# Patient Record
Sex: Male | Born: 1960 | Race: Black or African American | Hispanic: No | Marital: Single | Smoking: Current every day smoker
Health system: Southern US, Community
[De-identification: ages and names within clinical notes are randomized; demographics above are authoritative.]

## PROBLEM LIST (undated history)

## (undated) DIAGNOSIS — I639 Cerebral infarction, unspecified: Secondary | ICD-10-CM

## (undated) DIAGNOSIS — I10 Essential (primary) hypertension: Secondary | ICD-10-CM

## (undated) HISTORY — DX: Cerebral infarction, unspecified: I63.9

---

## 2005-10-17 ENCOUNTER — Ambulatory Visit: Payer: Self-pay | Admitting: Family Medicine

## 2013-02-23 ENCOUNTER — Inpatient Hospital Stay (HOSPITAL_COMMUNITY): Payer: Medicaid Other

## 2013-02-23 ENCOUNTER — Emergency Department (HOSPITAL_BASED_OUTPATIENT_CLINIC_OR_DEPARTMENT_OTHER): Payer: Medicaid Other

## 2013-02-23 ENCOUNTER — Encounter (HOSPITAL_BASED_OUTPATIENT_CLINIC_OR_DEPARTMENT_OTHER): Payer: Self-pay | Admitting: Emergency Medicine

## 2013-02-23 ENCOUNTER — Inpatient Hospital Stay (HOSPITAL_BASED_OUTPATIENT_CLINIC_OR_DEPARTMENT_OTHER)
Admission: EM | Admit: 2013-02-23 | Discharge: 2013-03-02 | DRG: 917 | Disposition: A | Discharge status: Rehabilitation Facility | Payer: Medicaid Other | Attending: Neurology | Admitting: Neurology

## 2013-02-23 DIAGNOSIS — G8190 Hemiplegia, unspecified affecting unspecified side: Secondary | ICD-10-CM | POA: Diagnosis present

## 2013-02-23 DIAGNOSIS — R569 Unspecified convulsions: Secondary | ICD-10-CM | POA: Diagnosis present

## 2013-02-23 DIAGNOSIS — T43601A Poisoning by unspecified psychostimulants, accidental (unintentional), initial encounter: Secondary | ICD-10-CM | POA: Diagnosis present

## 2013-02-23 DIAGNOSIS — D72829 Elevated white blood cell count, unspecified: Secondary | ICD-10-CM | POA: Diagnosis present

## 2013-02-23 DIAGNOSIS — F1010 Alcohol abuse, uncomplicated: Secondary | ICD-10-CM | POA: Diagnosis present

## 2013-02-23 DIAGNOSIS — T40901A Poisoning by unspecified psychodysleptics [hallucinogens], accidental (unintentional), initial encounter: Secondary | ICD-10-CM | POA: Diagnosis present

## 2013-02-23 DIAGNOSIS — G936 Cerebral edema: Secondary | ICD-10-CM | POA: Diagnosis present

## 2013-02-23 DIAGNOSIS — F1210 Cannabis abuse, uncomplicated: Secondary | ICD-10-CM | POA: Diagnosis present

## 2013-02-23 DIAGNOSIS — E876 Hypokalemia: Secondary | ICD-10-CM | POA: Diagnosis not present

## 2013-02-23 DIAGNOSIS — T405X1A Poisoning by cocaine, accidental (unintentional), initial encounter: Principal | ICD-10-CM | POA: Diagnosis present

## 2013-02-23 DIAGNOSIS — R1310 Dysphagia, unspecified: Secondary | ICD-10-CM | POA: Diagnosis present

## 2013-02-23 DIAGNOSIS — T40904A Poisoning by unspecified psychodysleptics [hallucinogens], undetermined, initial encounter: Secondary | ICD-10-CM | POA: Diagnosis present

## 2013-02-23 DIAGNOSIS — F1410 Cocaine abuse, uncomplicated: Secondary | ICD-10-CM | POA: Diagnosis present

## 2013-02-23 DIAGNOSIS — F17200 Nicotine dependence, unspecified, uncomplicated: Secondary | ICD-10-CM | POA: Diagnosis present

## 2013-02-23 DIAGNOSIS — I10 Essential (primary) hypertension: Secondary | ICD-10-CM | POA: Diagnosis present

## 2013-02-23 DIAGNOSIS — F1910 Other psychoactive substance abuse, uncomplicated: Secondary | ICD-10-CM | POA: Diagnosis present

## 2013-02-23 DIAGNOSIS — T510X4A Toxic effect of ethanol, undetermined, initial encounter: Secondary | ICD-10-CM | POA: Diagnosis present

## 2013-02-23 DIAGNOSIS — T510X1A Toxic effect of ethanol, accidental (unintentional), initial encounter: Secondary | ICD-10-CM | POA: Diagnosis present

## 2013-02-23 DIAGNOSIS — I619 Nontraumatic intracerebral hemorrhage, unspecified: Secondary | ICD-10-CM | POA: Diagnosis present

## 2013-02-23 DIAGNOSIS — E785 Hyperlipidemia, unspecified: Secondary | ICD-10-CM | POA: Diagnosis present

## 2013-02-23 DIAGNOSIS — R471 Dysarthria and anarthria: Secondary | ICD-10-CM | POA: Diagnosis present

## 2013-02-23 HISTORY — DX: Essential (primary) hypertension: I10

## 2013-02-23 LAB — URINALYSIS, ROUTINE W REFLEX MICROSCOPIC
Bilirubin Urine: NEGATIVE
Hgb urine dipstick: NEGATIVE
Ketones, ur: NEGATIVE mg/dL
Leukocytes, UA: NEGATIVE
Protein, ur: NEGATIVE mg/dL
Urobilinogen, UA: 0.2 mg/dL (ref 0.0–1.0)
pH: 6.5 (ref 5.0–8.0)

## 2013-02-23 LAB — RAPID URINE DRUG SCREEN, HOSP PERFORMED
Amphetamines: NOT DETECTED
Barbiturates: NOT DETECTED
Benzodiazepines: NOT DETECTED
Cocaine: POSITIVE — AB
Opiates: NOT DETECTED
Tetrahydrocannabinol: POSITIVE — AB

## 2013-02-23 LAB — APTT: aPTT: 22 seconds — ABNORMAL LOW (ref 24–37)

## 2013-02-23 LAB — DIFFERENTIAL
Basophils Absolute: 0 10*3/uL (ref 0.0–0.1)
Basophils Relative: 1 % (ref 0–1)
Eosinophils Absolute: 0 10*3/uL (ref 0.0–0.7)
Lymphocytes Relative: 25 % (ref 12–46)
Monocytes Absolute: 0.4 10*3/uL (ref 0.1–1.0)
Neutro Abs: 4.1 10*3/uL (ref 1.7–7.7)
Neutrophils Relative %: 67 % (ref 43–77)

## 2013-02-23 LAB — CBC
HCT: 44.2 % (ref 39.0–52.0)
MCHC: 33.5 g/dL (ref 30.0–36.0)
Platelets: 241 10*3/uL (ref 150–400)
RDW: 14.4 % (ref 11.5–15.5)

## 2013-02-23 LAB — COMPREHENSIVE METABOLIC PANEL
AST: 19 U/L (ref 0–37)
Albumin: 4.8 g/dL (ref 3.5–5.2)
CO2: 25 mEq/L (ref 19–32)
Calcium: 9.8 mg/dL (ref 8.4–10.5)
Chloride: 102 mEq/L (ref 96–112)
Creatinine, Ser: 1 mg/dL (ref 0.50–1.35)
Sodium: 143 mEq/L (ref 137–147)
Total Bilirubin: 0.3 mg/dL (ref 0.3–1.2)
Total Protein: 8.1 g/dL (ref 6.0–8.3)

## 2013-02-23 LAB — PROTIME-INR: INR: 0.95 (ref 0.00–1.49)

## 2013-02-23 LAB — ETHANOL: Alcohol, Ethyl (B): 31 mg/dL — ABNORMAL HIGH (ref 0–11)

## 2013-02-23 LAB — MRSA PCR SCREENING: MRSA by PCR: NEGATIVE

## 2013-02-23 MED ORDER — NICARDIPINE HCL IN NACL 20-0.86 MG/200ML-% IV SOLN
3.0000 mg/h | INTRAVENOUS | Status: DC
  Administered 2013-02-23: 5 mg/h via INTRAVENOUS
  Administered 2013-02-24: 10 mg/h via INTRAVENOUS
  Administered 2013-02-24: 4 mg/h via INTRAVENOUS
  Administered 2013-02-24: 15 mg/h via INTRAVENOUS
  Administered 2013-02-24: 5 mg/h via INTRAVENOUS
  Administered 2013-02-24: 15 mg/h via INTRAVENOUS
  Administered 2013-02-25: 7.5 mg/h via INTRAVENOUS
  Administered 2013-02-25: 10 mg/h via INTRAVENOUS
  Administered 2013-02-25: 7.5 mg/h via INTRAVENOUS
  Administered 2013-02-25: 5 mg/h via INTRAVENOUS
  Administered 2013-02-25: 7.5 mg/h via INTRAVENOUS
  Administered 2013-02-25: 5 mg/h via INTRAVENOUS
  Administered 2013-02-26 (×2): 7.5 mg/h via INTRAVENOUS
  Filled 2013-02-23 (×14): qty 200

## 2013-02-23 MED ORDER — SODIUM CHLORIDE 0.9 % IV BOLUS (SEPSIS)
500.0000 mL | Freq: Once | INTRAVENOUS | Status: AC
  Administered 2013-02-23: 500 mL via INTRAVENOUS

## 2013-02-23 MED ORDER — LABETALOL HCL 5 MG/ML IV SOLN
20.0000 mg | Freq: Once | INTRAVENOUS | Status: AC
  Administered 2013-02-23: 20 mg via INTRAVENOUS
  Filled 2013-02-23: qty 4

## 2013-02-23 MED ORDER — ACETAMINOPHEN 500 MG PO TABS: 500.0000 mg | ORAL_TABLET | Freq: Four times a day (QID) | ORAL | Status: AC | PRN

## 2013-02-23 MED ORDER — HYDROCORTISONE 1 % EX CREA: TOPICAL_CREAM | CUTANEOUS | Status: DC

## 2013-02-23 MED ORDER — SODIUM CHLORIDE 0.9 % IV SOLN
100.0000 mL/h | INTRAVENOUS | Status: DC
  Administered 2013-02-23: 100 mL/h via INTRAVENOUS

## 2013-02-23 MED ORDER — ACETAMINOPHEN 650 MG RE SUPP
650.0000 mg | RECTAL | Status: DC | PRN
  Administered 2013-02-25: 650 mg via RECTAL
  Filled 2013-02-23: qty 1

## 2013-02-23 MED ORDER — IOHEXOL 350 MG/ML SOLN
50.0000 mL | Freq: Once | INTRAVENOUS | Status: AC | PRN
  Administered 2013-02-23: 50 mL via INTRAVENOUS

## 2013-02-23 MED ORDER — LABETALOL HCL 5 MG/ML IV SOLN
INTRAVENOUS | Status: AC
  Administered 2013-02-23: 10 mg
  Filled 2013-02-23: qty 4

## 2013-02-23 MED ORDER — PANTOPRAZOLE SODIUM 40 MG IV SOLR
40.0000 mg | Freq: Every day | INTRAVENOUS | Status: DC
  Administered 2013-02-24: 40 mg via INTRAVENOUS
  Filled 2013-02-23 (×4): qty 40

## 2013-02-23 MED ORDER — SENNOSIDES-DOCUSATE SODIUM 8.6-50 MG PO TABS
1.0000 | ORAL_TABLET | Freq: Two times a day (BID) | ORAL | Status: DC
  Administered 2013-02-24 – 2013-03-01 (×8): 1 via ORAL
  Filled 2013-02-23 (×13): qty 1

## 2013-02-23 MED ORDER — ACETAMINOPHEN 325 MG PO TABS
650.0000 mg | ORAL_TABLET | ORAL | Status: DC | PRN
  Administered 2013-02-24 – 2013-02-28 (×5): 650 mg via ORAL
  Filled 2013-02-23 (×5): qty 2

## 2013-02-23 NOTE — ED Notes (Signed)
Levonne Lapping, RN charge nurse [place] of pt transport to 3100 by EMS. Also confirmed bed 3M02 with Burnett Harry, RN charge 3100. EMS advised prior to transport

## 2013-02-23 NOTE — ED Notes (Signed)
MD at bedside. 

## 2013-02-23 NOTE — ED Notes (Signed)
Pt found on floor by wife with left sided weakness at 1915.  Last seen normal at 1845.

## 2013-02-23 NOTE — ED Provider Notes (Signed)
CSN: [id number]     Arrival date & time 02/23/13  2002 History  This chart was scribed for Eric Quarry, MD by Quintella Reichert, ED scribe.  This patient was seen in room MH09/MH09 and the patient's care was started at 8:10 PM.   Chief Complaint  Patient presents with  . Code Stroke    The history is provided by the patient and a relative. No language interpreter was used.    Level 5 Caveat: Urgent Need for Intervention  HPI Comments: Eric May is a 52 y.o. male with h/o HTN (uncontrolled) who presents to the Emergency Department complaining of sudden-onset left-sided weakness and facial droop that occurred approximately one hour ago.  Pt was at work with his wife and nephew and was last seen normal at around 6:45 PM.  At around 6:50 he went to the restroom and his wife found him lying on the floor at around 7:15.  Nephew reports pt was unable to get up off of the ground by himself.  When helped to stand up he was unable to bear weight on his left leg.  Currently pt presents with left-sided facial droop and states he is not able to move his left arm or leg.  He denies pain to any area.  On arrival he also has a systolic BP of 211.  Pt used to take BP medications but is not taking them currently and   He is not using any medications currently.  He drinks beer "basically every day" per nephew.  He is a current smoker at around 10 cigarettes per day.  He smokes marijuana.  Nephew denies him using any other illicit drugs to his knowledge.   Past Medical History  Diagnosis Date  . Hypertension     History reviewed. No pertinent past surgical history.  No family history on file.   History  Substance Use Topics  . Smoking status: Current Every Day Smoker -- 1.00 packs/day  . Smokeless tobacco: Not on file  . Alcohol Use: Yes     Comment: Drinks beer every day     Review of Systems  Unable to perform ROS: Acuity of condition     Allergies  Review of patient's allergies  indicates no known allergies.  Home Medications  No current outpatient prescriptions on file.  BP 230/166  Pulse 68  SpO2 100%  Physical Exam  Nursing note and vitals reviewed. Constitutional: He is oriented to person, place, and time. He appears well-developed and well-nourished. No distress.  HENT:  Head: Normocephalic and atraumatic.  Eyes: EOM are normal.  Neck: Neck supple. No tracheal deviation present.  Cardiovascular: Normal rate.   Pulmonary/Chest: Effort normal. No respiratory distress.  Musculoskeletal: Normal range of motion.  Neurological: He is alert and oriented to person, place, and time. A cranial nerve deficit is present. He displays no seizure activity. GCS eye subscore is 4. GCS verbal subscore is 5. GCS motor subscore is 6. He displays no Babinski's sign on the right side.  Reflex Scores:      Patellar reflexes are 2+ on the right side and 2+ on the left side.      Achilles reflexes are 1+ on the right side and 1+ on the left side. Left facial droop, left upper extremity flaccid, left lower extremity, 3/5   Skin: Skin is warm and dry.  Psychiatric: He has a normal mood and affect. His behavior is normal.    ED Course  Procedures (including critical care time)  DIAGNOSTIC STUDIES: Oxygen Saturation is 100% on room air, normal by my interpretation.    COORDINATION OF CARE: 8:15 PM-Discussed treatment plan which includes stroke workup with pt and family at bedside and they agreed to plan.    Labs Review Labs Reviewed  APTT - Abnormal; Notable for the following:    aPTT 22 (*)    All other components within normal limits  PROTIME-INR  CBC  DIFFERENTIAL  GLUCOSE, CAPILLARY  ETHANOL  COMPREHENSIVE METABOLIC PANEL  URINE RAPID DRUG SCREEN (HOSP PERFORMED)  URINALYSIS, ROUTINE W REFLEX MICROSCOPIC    Imaging Review Ct Head Wo Contrast  02/23/2013   CLINICAL DATA:  Code stroke.  EXAM: CT HEAD WITHOUT CONTRAST  TECHNIQUE: Contiguous axial images  were obtained from the base of the skull through the vertex without intravenous contrast.  COMPARISON:  02/13/2013 and 07/15/2004  FINDINGS: The examination demonstrates an acute parenchymal hemorrhage over the right temporoparietal region measuring approximately 4.9 x 3.2 cm in its AP and transverse dimensions. This has mass local effect as there is also mild midline shift to the left of 4 mm. No other areas of hemorrhage are identified. Remainder of the exam is unchanged.  IMPRESSION: Acute parenchymal hemorrhage over the right temporal parietal region measuring 4.9 x 3.2 cm in its AP and transverse dimensions with local mass effect and midline shift to the left of 4 mm.  Critical Value/emergent results were called by telephone at the time of interpretation on 02/23/2013 at 8:39 PM to Dr. Margarita Grizzle , who verbally acknowledged these results.   Electronically Signed   By: Elberta Fortis M.D.   On: 02/23/2013 20:39     EKG Interpretation   None       MDM  No diagnosis found.  Patient called as code stroke and discussed with Dr. Thad Ranger pre and post ct- bleed seen, recheck of blood pressure continues elevated. Labetalol IV is ordered. Patient continues to be awake and able to follow commands and appears to have control of his airway. [facility] has been called to transport as code stroke. Dr. Thad Ranger has arranged for bed and 3100. I personally performed the services described in this documentation, which was scribed in my presence. The recorded information has been reviewed and considered.   Eric Quarry, MD 02/23/13 [phone]

## 2013-02-23 NOTE — H&P (Signed)
Admission H&P    Chief Complaint: Left sided numbness and weakness  HPI: Eric May is an 52 y.o. male who was at work today and found down by co-workers with left sided weakness.  The patient reports that he was doing well until he went to the bathroom at about 1845.  He reports falling at that time due to weakness on the left.  EMS was called and the patient was brought in to [facility] for evaluation.  Date last known well: Date: 02/23/2013 Time last known well: Time: 18:30 tPA Given: No: ICH  Past Medical History  Diagnosis Date  . Hypertension     History reviewed. No pertinent past surgical history.  Family history: Reports mother is alive and well.  Unable to give me any information about his father.  Social History:  reports that he has been smoking about a pack a day of cigarettes.  He does not have any smokeless tobacco history on file. He reports that he drinks alcohol-about a 6-pack a day. He reports that he smokes marijuana and uses cocaine as well.    Allergies: No Known Allergies  Medications Prior to Admission  Medication Sig Dispense Refill  . meclizine (ANTIVERT) 12.5 MG tablet Take 12.5 mg by mouth 3 (three) times daily as needed for nausea.      . naproxen sodium (ANAPROX) 220 MG tablet Take 440 mg by mouth 2 (two) times daily as needed (for pain).        ROS: History obtained from the patient  General ROS: negative for - chills, fatigue, fever, night sweats, weight gain or weight loss Psychological ROS: negative for - behavioral disorder, hallucinations, memory difficulties, mood swings or suicidal ideation Ophthalmic ROS: negative for - blurry vision, double vision, eye pain or loss of vision ENT ROS: negative for - epistaxis, nasal discharge, oral lesions, sore throat, tinnitus or vertigo Allergy and Immunology ROS: negative for - hives or itchy/watery eyes Hematological and Lymphatic ROS: negative for - bleeding problems, bruising or swollen lymph  nodes Endocrine ROS: negative for - galactorrhea, hair pattern changes, polydipsia/polyuria or temperature intolerance Respiratory ROS: negative for - cough, hemoptysis, shortness of breath or wheezing Cardiovascular ROS: negative for - chest pain, dyspnea on exertion, edema or irregular heartbeat Gastrointestinal ROS: negative for - abdominal pain, diarrhea, hematemesis, nausea/vomiting or stool incontinence Genito-Urinary ROS: negative for - dysuria, hematuria, incontinence or urinary frequency/urgency Musculoskeletal ROS: negative for - joint swelling or muscular weakness Neurological ROS: as noted in HPI, headache Dermatological ROS: negative for rash and skin lesion changes  Physical Examination: Blood pressure 194/117, pulse 62, temperature 97.7 F (36.5 C), temperature source Oral, resp. rate 15, height 6' (1.829 m), weight 63.7 kg (140 lb 6.9 oz), SpO2 100.00%.  General Examination: HEENT-  Normocephalic, no lesions, without obvious abnormality.  Normal external eye and conjunctiva.  Normal TM's bilaterally.  Normal auditory canals and external ears. Normal external nose, mucus membranes and septum.  Normal pharynx. Neck supple with no masses, nodes, nodules or enlargement. Cardiovascular - S1, S2 normal Lungs - chest clear, no wheezing, rales, normal symmetric air entry, Heart exam - S1, S2 normal, no murmur, no gallop, rate regular Abdomen - soft, non-tender; bowel sounds normal; no masses,  no organomegaly Extremities - no edema  Neurologic Examination: Mental Status: Alert, oriented, thought content appropriate.  Speech fluent but slurred..  Able to follow simple commands without difficulty.  Left neglect. Cranial Nerves: II: Discs flat bilaterally; LHH, pupils equal, round, reactive to light and  accommodation III,IV, VI: ptosis not present, right gaze preference with patient unable to go beyond midline to the left.   V,VII: left facial droop, facial light touch sensation  decreased on the left VIII: hearing normal bilaterally IX,X: gag reflex reduced XI: shoulder shrug decreased on the left XII: midline tongue extension Motor: Right : Upper extremity   5/5    Left:     Upper extremity   0/5  Lower extremity   5/5     Lower extremity   3+/5 Tone and bulk:normal tone throughout; no atrophy noted Sensory: Pinprick and light touch decreased on the left Deep Tendon Reflexes: 2+ and symmetric throughout Plantars: Right: downgoing   Left: mute Cerebellar: normal finger-to-nose on the right, unable to perform on the left Gait: Unable to test CV: pulses palpable throughout   Laboratory Studies:   Basic Metabolic Panel:  Recent Labs Lab 02/23/13 2016  NA 143  K 3.4*  CL 102  CO2 25  GLUCOSE 94  BUN 9  CREATININE 1.00  CALCIUM 9.8    Liver Function Tests:  Recent Labs Lab 02/23/13 2016  AST 19  ALT 12  ALKPHOS 77  BILITOT 0.3  PROT 8.1  ALBUMIN 4.8   No results found for this basename: LIPASE, AMYLASE,  in the last 168 hours No results found for this basename: AMMONIA,  in the last 168 hours  CBC:  Recent Labs Lab 02/23/13 2016  WBC 6.1  NEUTROABS 4.1  HGB 14.8  HCT 44.2  MCV 88.8  PLT 241    Cardiac Enzymes: No results found for this basename: CKTOTAL, CKMB, CKMBINDEX, TROPONINI,  in the last 168 hours  BNP: No components found with this basename: POCBNP,   CBG:  Recent Labs Lab 02/23/13 2028  GLUCAP 79    Microbiology: No results found for this or any previous visit.  Coagulation Studies:  Recent Labs  02/23/13 2016  LABPROT 12.5  INR 0.95    Urinalysis:  Recent Labs Lab 02/23/13 2123  COLORURINE YELLOW  LABSPEC 1.016  PHURINE 6.5  GLUCOSEU NEGATIVE  HGBUR NEGATIVE  BILIRUBINUR NEGATIVE  KETONESUR NEGATIVE  PROTEINUR NEGATIVE  UROBILINOGEN 0.2  NITRITE NEGATIVE  LEUKOCYTESUR NEGATIVE    Lipid Panel:  No results found for this basename: chol, trig, hdl, cholhdl, vldl, ldlcalc    HgbA1C:   No results found for this basename: HGBA1C    Urine Drug Screen:   No results found for this basename: labopia, cocainscrnur, labbenz, amphetmu, thcu, labbarb    Alcohol Level:  Recent Labs Lab 02/23/13 2016  ETH 31*    Other results: EKG: normal sinus rhythm at 69 bpm.  Imaging: Ct Head Wo Contrast  02/23/2013   CLINICAL DATA:  Code stroke.  EXAM: CT HEAD WITHOUT CONTRAST  TECHNIQUE: Contiguous axial images were obtained from the base of the skull through the vertex without intravenous contrast.  COMPARISON:  02/13/2013 and 07/15/2004  FINDINGS: The examination demonstrates an acute parenchymal hemorrhage over the right temporoparietal region measuring approximately 4.9 x 3.2 cm in its AP and transverse dimensions. This has mass local effect as there is also mild midline shift to the left of 4 mm. No other areas of hemorrhage are identified. Remainder of the exam is unchanged.  IMPRESSION: Acute parenchymal hemorrhage over the right temporal parietal region measuring 4.9 x 3.2 cm in its AP and transverse dimensions with local mass effect and midline shift to the left of 4 mm.  Critical Value/emergent results were called by telephone  at the time of interpretation on 02/23/2013 at 8:39 PM to Dr. Margarita Grizzle , who verbally acknowledged these results.   Electronically Signed   By: Elberta Fortis M.D.   On: 02/23/2013 20:39    Assessment: 52 y.o. male presenting with acute onset left sided weakness/numbness, left neglect, LHH and right gaze preference.  Patient significantly hypertensive, on no antihypertensives prior to admission.  CT reviewed and shows a right temporoparietal intracerebral hemorrhage.  There is associated mass effect and midline shift.   Patient is awaken and alert.  No indication for neurosurgical intervention at this time.  Hemorrhage likely hypertensive in origin but with history of drug abuse will rule out aneurysm as well.    Stroke Risk Factors - hypertension and  smoking  Plan: 1. HgbA1c, fasting lipid panel 2. MRI of the brain without contrast on 1/1 3. PT consult, OT consult, Speech consult 4. Echocardiogram 5. Carotid dopplers 6. Prophylactic therapy-None 7. Counseling for tobacco, alcohol and drug cessation 8. Telemetry monitoring 9. Frequent neuro checks 10. Cardene for BP control 11. CTA of head and neck to rule out aneurysm with history of drug abuse.    This patient is critically ill and at significant risk of neurological worsening, death and care requires constant monitoring of vital signs, hemodynamics,respiratory and cardiac monitoring, neurological assessment, discussion with family, other specialists and medical decision making of high complexity. I spent 60 minutes of neurocritical care time  in the care of  this patient.    Thana Farr, MD [facility] [phone] 02/23/2013, 10:22 PM

## 2013-02-24 ENCOUNTER — Inpatient Hospital Stay (HOSPITAL_COMMUNITY): Payer: Medicaid Other

## 2013-02-24 ENCOUNTER — Encounter (HOSPITAL_COMMUNITY): Payer: Self-pay | Admitting: Radiology

## 2013-02-24 DIAGNOSIS — R569 Unspecified convulsions: Secondary | ICD-10-CM

## 2013-02-24 MED ORDER — SODIUM CHLORIDE 0.9 % IV SOLN
500.0000 mg | Freq: Two times a day (BID) | INTRAVENOUS | Status: DC
  Administered 2013-02-25 – 2013-02-27 (×5): 500 mg via INTRAVENOUS
  Filled 2013-02-24 (×6): qty 5

## 2013-02-24 MED ORDER — LORAZEPAM 2 MG/ML IJ SOLN
INTRAMUSCULAR | Status: AC
  Filled 2013-02-24: qty 1

## 2013-02-24 MED ORDER — AMLODIPINE BESYLATE 5 MG PO TABS
5.0000 mg | ORAL_TABLET | Freq: Every day | ORAL | Status: DC
  Administered 2013-02-24: 5 mg via ORAL
  Filled 2013-02-24 (×2): qty 1

## 2013-02-24 MED ORDER — LORAZEPAM 2 MG/ML IJ SOLN
1.0000 mg | INTRAMUSCULAR | Status: DC | PRN
  Filled 2013-02-24 (×2): qty 1

## 2013-02-24 MED ORDER — LORAZEPAM 2 MG/ML IJ SOLN
1.0000 mg | Freq: Once | INTRAMUSCULAR | Status: AC
  Administered 2013-02-24: 1 mg via INTRAVENOUS

## 2013-02-24 MED ORDER — BIOTENE DRY MOUTH MT LIQD
15.0000 mL | Freq: Two times a day (BID) | OROMUCOSAL | Status: DC
  Administered 2013-02-24 – 2013-03-02 (×12): 15 mL via OROMUCOSAL

## 2013-02-24 MED ORDER — LEVETIRACETAM 500 MG/5ML IV SOLN
1000.0000 mg | Freq: Once | INTRAVENOUS | Status: AC
  Administered 2013-02-24: 1000 mg via INTRAVENOUS
  Filled 2013-02-24: qty 10

## 2013-02-24 MED ORDER — ONDANSETRON HCL 4 MG/2ML IJ SOLN
4.0000 mg | Freq: Three times a day (TID) | INTRAMUSCULAR | Status: DC | PRN
  Administered 2013-02-24 – 2013-02-28 (×2): 4 mg via INTRAVENOUS
  Filled 2013-02-24 (×3): qty 2

## 2013-02-24 NOTE — Evaluation (Signed)
Clinical/Bedside Swallow Evaluation Patient Details  Name: Eric May MRN: [id number] Date of Birth: [birth year]  Today's Date: 02/24/2013 Time: 1027-1040 SLP Time Calculation (min): 13 min  Past Medical History:  Past Medical History  Diagnosis Date  . Hypertension    Past Surgical History: History reviewed. No pertinent past surgical history. HPI:  53 y.o. male presenting with left sided numbness and weakness. Imaging confirms a right temporoparietal intracerebral hemorrhage with mass effect and 44 mm midline shift; cytotoxic cerebral edema. 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   Assessment / Plan / Recommendation Clinical Impression  Pt presents with sensorimotor dysphagia with CN deficits impacting oral preparation and awareness of POs, likely delayed swallow response, and s/s of penetration/aspiration with thin and nectar liquids.  Recommend initiating a conservative diet for now - dysphagia 1, honey thick liquids - with plan to proceed with MBS next date.  D/W RN and MD.    Aspiration Risk  Moderate    Diet Recommendation Dysphagia 1 (Puree);Honey-thick liquid   Liquid Administration via: Cup Medication Administration: Crushed with puree Supervision: Full supervision/cueing for compensatory strategies Compensations: Slow rate;Small sips/bites;Check for pocketing;Check for anterior loss Postural Changes and/or Swallow Maneuvers: Seated upright 90 degrees    Other  Recommendations Recommended Consults: MBS Oral Care Recommendations: Oral care BID Other Recommendations: Order thickener from pharmacy   Follow Up Recommendations   (tba)    Frequency and Duration min 3x week  2 weeks       SLP Swallow Goals   See care plan  Swallow Study Prior Functional Status       General Date of Onset: 02/23/13 HPI: 53 y.o. male presenting with left sided numbness and weakness. Imaging  confirms a right temporoparietal intracerebral hemorrhage with mass effect and 44 mm midline shift; cytotoxic cerebral edema. 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 Type of Study: Bedside swallow evaluation Diet Prior to this Study: NPO Temperature Spikes Noted: No Respiratory Status: Room air History of Recent Intubation: No Behavior/Cognition: Lethargic Oral Cavity - Dentition: Missing dentition Self-Feeding Abilities: Needs assist Patient Positioning: Upright in bed Baseline Vocal Quality: Clear Volitional Cough: Strong Volitional Swallow: Able to elicit    Oral/Motor/Sensory Function Overall Oral Motor/Sensory Function: Impaired (Impaired L CN V,VII, IX, X)   Ice Chips Ice chips: Impaired Presentation: Spoon Oral Phase Impairments: Reduced labial seal;Poor awareness of bolus Oral Phase Functional Implications: Left anterior spillage;Left lateral sulci pocketing;Prolonged oral transit;Oral residue Pharyngeal Phase Impairments: Suspected delayed Swallow;Decreased hyoid-laryngeal movement;Wet Vocal Quality;Throat Clearing - Immediate   Thin Liquid Thin Liquid: Impaired Presentation: Spoon;Cup Oral Phase Impairments: Reduced labial seal;Reduced lingual movement/coordination;Poor awareness of bolus Oral Phase Functional Implications: Left anterior spillage;Left lateral sulci pocketing;Prolonged oral transit;Oral residue Pharyngeal  Phase Impairments: Suspected delayed Swallow;Decreased hyoid-laryngeal movement;Multiple swallows;Wet Vocal Quality;Throat Clearing - Delayed    Nectar Thick Nectar Thick Liquid: Impaired Presentation: Spoon Oral Phase Impairments: Reduced labial seal;Reduced lingual movement/coordination;Poor awareness of bolus Oral phase functional implications: Left anterior spillage;Left lateral sulci pocketing;Prolonged oral transit;Oral residue Pharyngeal Phase Impairments: Suspected  delayed Swallow;Decreased hyoid-laryngeal movement;Multiple swallows;Wet Vocal Quality   Honey Thick Honey Thick Liquid: Impaired Presentation: Cup Oral Phase Impairments: Reduced labial seal;Reduced lingual movement/coordination Oral Phase Functional Implications: Left anterior spillage;Prolonged oral transit;Oral residue Pharyngeal Phase Impairments: Suspected delayed Swallow;Decreased hyoid-laryngeal movement;Multiple swallows   Puree Puree: Impaired Presentation: Spoon Oral Phase  Impairments: Reduced labial seal Oral Phase Functional Implications: Left lateral sulci pocketing Pharyngeal Phase Impairments: Suspected delayed Swallow;Decreased hyoid-laryngeal movement   Solid   Nethra Mehlberg L. [place], [place] CCC/SLP Pager [phone]     Solid: Not tested       Blenda Mounts Laurice 02/24/2013,10:55 AM

## 2013-02-24 NOTE — Progress Notes (Signed)
2237 Pt began seizing, seizure lasted approximately one min. Paged Dr. Basilio Cairoeyolds. Orders to give 1 mg of Ativan and IV keppra. 2300 Dr. Thad Rangereynolds at bedside. Pt lethargic, pupils 2 equal, round, and reactive. PRN orders for Ativan 1 mg q 1 hr placed. Will continue to monitor pt.

## 2013-02-24 NOTE — Progress Notes (Addendum)
Stroke Team Progress Note  HISTORY Eric May is an 53 y.o. male who was at work today 02/23/2013 and found down by co-workers with left sided weakness. The patient reports that he was doing well until he went to the bathroom at about 1845. He reports falling at that time due to weakness on the left. EMS was called and the [facility].  Patient was not a TPA candidate secondary to ICH. He was admitted to the neuro ICU for further evaluation and treatment.  SUBJECTIVE His RN is at the bedside. No family present Overall he feels his condition is stable. "I feel all right". Complains of a headache and weak on one side.  OBJECTIVE Most recent Vital Signs: Filed Vitals:   02/24/13 0600 02/24/13 0700 02/24/13 0730 02/24/13 0800  BP: 126/69 145/79 131/79 142/81  Pulse: 81 71 74 69  Temp:    99 F (37.2 C)  TempSrc:    Oral  Resp: 14 14 13 13   Height:      Weight:      SpO2: 95% 99% 98% 97%   CBG (last 3)   Recent Labs  02/23/13 2028  GLUCAP 79    IV Fluid Intake:   . sodium chloride 100 mL/hr (02/24/13 0600)  . niCARDipine 4 mg/hr (02/24/13 0827)    MEDICATIONS  . pantoprazole (PROTONIX) IV  40 mg Intravenous QHS  . senna-docusate  1 tablet Oral BID   PRN:  acetaminophen, acetaminophen, ondansetron (ZOFRAN) IV  Diet:  NPO  Activity:  Bedrest DVT Prophylaxis:  SCDs   CLINICALLY SIGNIFICANT STUDIES Basic Metabolic Panel:   Recent Labs Lab 02/23/13 2016  NA 143  K 3.4*  CL 102  CO2 25  GLUCOSE 94  BUN 9  CREATININE 1.00  CALCIUM 9.8   Liver Function Tests:   Recent Labs Lab 02/23/13 2016  AST 19  ALT 12  ALKPHOS 77  BILITOT 0.3  PROT 8.1  ALBUMIN 4.8   CBC:   Recent Labs Lab 02/23/13 2016  WBC 6.1  NEUTROABS 4.1  HGB 14.8  HCT 44.2  MCV 88.8  PLT 241   Coagulation:   Recent Labs Lab 02/23/13 2016  LABPROT 12.5  INR 0.95   Cardiac Enzymes: No results found for this basename: CKTOTAL, CKMB,  CKMBINDEX, TROPONINI,  in the last 168 hours Urinalysis:   Recent Labs Lab 02/23/13 2123  COLORURINE YELLOW  LABSPEC 1.016  PHURINE 6.5  GLUCOSEU NEGATIVE  HGBUR NEGATIVE  BILIRUBINUR NEGATIVE  KETONESUR NEGATIVE  PROTEINUR NEGATIVE  UROBILINOGEN 0.2  NITRITE NEGATIVE  LEUKOCYTESUR NEGATIVE   Lipid Panel No results found for this basename: chol,  trig,  hdl,  cholhdl,  vldl,  ldlcalc   HgbA1C  No results found for this basename: HGBA1C    Urine Drug Screen:     Component Value Date/Time   LABOPIA NONE DETECTED 02/23/2013 2123   COCAINSCRNUR POSITIVE* 02/23/2013 2123   LABBENZ NONE DETECTED 02/23/2013 2123   AMPHETMU NONE DETECTED 02/23/2013 2123   THCU POSITIVE* 02/23/2013 2123   LABBARB NONE DETECTED 02/23/2013 2123    Alcohol Level:   Recent Labs Lab 02/23/13 2016  ETH 31*   CT of the brain  02/23/2013   Acute parenchymal hemorrhage over the right temporal parietal region measuring 4.9 x 3.2 cm in its AP and transverse dimensions with local mass effect and midline shift to the left of 4 mm.    CT angio head   02/24/2013  1. No intracranial aneurysm identified. 2. Slight interval enlargement of right frontal temporal hematoma now measuring 5.2 x 3.3 cm, previously 4.9 x 3.2 cm. Associated vasogenic edema is slightly increased. 4 mm of right-to-left midline shift is stable. 3. Interval development of intraventricular hemorrhage within the right lateral and 3rd ventricles. No hydrocephalus. 4. Mild stenosis of approximately 30% within the cavernous right ICA. No high-grade flow-limiting stenosis or occlusion identified within the intracranial circulation.    CT angio neck   02/24/2013   1. No high-grade stenosis, dissection, or other abnormality identified within the vasculature of the neck.     MRI of the brain    MRA of the brain    2D Echocardiogram    Carotid Doppler    CXR    EKG  Normal sinus rhythm. Possible Left atrial enlargement. Incomplete right bundle  branch block. Left ventricular hypertrophy. Abnormal ECG.   Therapy Recommendations   Physical Exam   General Examination:  HEENT- Normocephalic, no lesions, without obvious abnormality.  Normal external nose, mucus membranes and septum.  Cardiovascular - S1, S2 normal  Lungs - chest clear, no wheezing, rales, normal symmetric air entry, Heart exam - S1, S2 normal, no murmur, no gallop, rate regular  Abdomen - soft, non-tender; bowel sounds normal; no masses, no organomegaly  Extremities - no edema  Neurologic Examination:  Mental Status:  Lethargic but easily arousable, oriented to name/"hospital"/January 2015, follows simple commands with Right side, Left neglect.  Cranial Nerves:  II: decreased blink to threat in the L VF, pupils equal, round, reactive to light III,IV, VI: ptosis not present, right gaze preference with patient unable to go beyond midline to the left.  V,VII: left facial droop, facial light touch sensation decreased on the left  VIII: hearing normal bilaterally  XII: midline tongue extension  Motor:  Right : Upper extremity 5/5 Left: Upper extremity 0/5  Lower extremity 5/5 Lower extremity 3+/5  Tone and bulk:normal tone throughout; no atrophy noted  Sensory: Pinprick and light touch decreased on the left  Deep Tendon Reflexes: 2+ and symmetric throughout  Plantars:  Right: downgoing Left: mute  Cerebellar:  Patient not following command Gait: Unable to test     ASSESSMENT Mr. Eric May is a 53 y.o. male presenting with left sided numbness and weakness. Imaging confirms a right temporoparietal intracerebral hemorrhage with mass effect and 44 mm midline shift; cytotoxic cerebral edema. 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  On scheduled antithrombotics prior to admission. Patient with resultant left hemiparesis, dysarthria, dysphagia. Work up underway.  Malignant  hypertension, 194/117 on arrival. Not on antihypertensives prior to admission. Placed on cardene drip with good control overnight  Cigarette smoker etoh use, 6-pk/day THC position Cocaine positive  Hospital day # 1  TREATMENT/PLAN  Continue ICU level care  ST to assess swallow  Once able to swallow, start oral antihypertensives and wean cardene  Repeat CT this afternoon to check for progression/stability  Keep in bed today, anticipate ok for therapy evals tomororw  Annie Main, MSN, RN, ANVP-BC, ANP-BC, GNP-BC [facility] Pager: [phone] 02/24/2013 8:35 AM  I have personally obtained a history, examined the patient, evaluated imaging results, and formulated the assessment and plan of care. I agree with the above.  This patient is critically ill and at significant risk of neurological worsening, death and care requires constant monitoring of vital signs, hemodynamics,respiratory and cardiac monitoring,  neurological assessment,  other specialists and medical decision making of high complexity. A total of 45 minutes was spent in patient care.   Elspeth Cho, DO Neurology-Stroke

## 2013-02-24 NOTE — Progress Notes (Signed)
Paged Stroke regarding pt SBP in 170's. Dr. Thad Rangereynolds said she would monitor and call me with orders if necessary. 2256 Dr. Thad Rangereynolds called and ordered Cardene to be turned back on starting at 5 mg/hr and titrated and appropriately. 2300 BP 119/73 and Cardene turned off. Will continue to monitor BP

## 2013-02-24 NOTE — Progress Notes (Signed)
Progress Note: Called by nursing due to patient having a generalized seizure.  Patient given 1mg  of Ativan IV and loaded with Keppra 1000mg  IV STAT.  Maintenance Keppra to be initiated at 500mg  IV q 12hours. Patient evaluated after administration of Ativan.  Patient is lethargic and will not follow commands but pupils equal at 3mm and reactive.  Right gaze preference remains but doll's eyes obtained on testing.  MRI of the brain recently completed and reviewed.  No significant change in hemorrhage noted.  No evidence of hydrocephalus.   BP has remained elevated throughout the day.  Cardene restarted andto be titrated to blood pressure parameters.    Recommendations: Seizure precautions to be maintained.   Continue neuro checks  Thana FarrLeslie Mackinley Cassaday, MD [facility] [phone]

## 2013-02-24 NOTE — Progress Notes (Signed)
PT Cancellation Note  Patient Details Name: Eric May MRN: [id number] DOB: 12/07/1960   Cancelled Treatment:    Reason Eval/Treat Not Completed: Medical issues which prohibited therapy.  MD wants pt to remain on bedrest today. 02/24/2013  [place] BingKen Javonne Louissaint, PT [phone]  (pager)   [name], Eliseo GumKenneth V 02/24/2013, 9:34 AM

## 2013-02-24 NOTE — Consult Note (Signed)
CC:  Chief Complaint  Patient presents with  . Code Stroke    HPI: Eric May is a 53 y.o. male admitted to neuroICU with right ganglionic hypertensive ICH with extension into right lat ventricle. Pt apparently was at work, went to the bathroom, and found by coworkers on the floor with weakness on the left.   PMH: Past Medical History  Diagnosis Date  . Hypertension     PSH: History reviewed. No pertinent past surgical history.  SH: History  Substance Use Topics  . Smoking status: Current Every Day Smoker -- 1.00 packs/day  . Smokeless tobacco: Not on file  . Alcohol Use: Yes     Comment: Drinks beer every day    MEDS: Prior to Admission medications   Medication Sig Start Date End Date Taking? Authorizing Provider  meclizine (ANTIVERT) 12.5 MG tablet Take 12.5 mg by mouth 3 (three) times daily as needed for nausea.   Yes Historical Provider, MD  naproxen sodium (ANAPROX) 220 MG tablet Take 440 mg by mouth 2 (two) times daily as needed (for pain).   Yes Historical Provider, MD  acetaminophen (TYLENOL) 500 MG tablet Take 1 tablet (500 mg total) by mouth every 6 (six) hours as needed. 02/23/13   Hilario Quarryanielle S Ray, MD  hydrocortisone cream 1 % Apply to affected area 2 times daily 02/23/13   Hilario Quarryanielle S Ray, MD    ALLERGY: No Known Allergies  NEUROLOGIC EXAM: Drowsy but easily arousable Speech dysarthric Right gaze preference Motor exam: RUE/RLE 5/5 LUE 0/5 LLE 3/5 Left neglect  IMGAING: CTH demonstrates right ganglionic IPH with extension into the right lat vent with MLS. No HCP  IMPRESSION: - 53 y.o. male with hypertensive ICH with IVH, with left hemiparesis but no evidence of HCP  PLAN: - Cont current supportive mgmt - Monitor for HCP

## 2013-02-25 ENCOUNTER — Inpatient Hospital Stay (HOSPITAL_COMMUNITY): Payer: Medicaid Other

## 2013-02-25 DIAGNOSIS — I517 Cardiomegaly: Secondary | ICD-10-CM

## 2013-02-25 MED ORDER — THIAMINE HCL 100 MG/ML IJ SOLN: 100.0000 mg | Freq: Every day | INTRAMUSCULAR | Status: DC

## 2013-02-25 MED ORDER — AMLODIPINE BESYLATE 10 MG PO TABS
10.0000 mg | ORAL_TABLET | Freq: Every day | ORAL | Status: DC
  Administered 2013-02-26 – 2013-03-02 (×5): 10 mg via ORAL
  Filled 2013-02-25 (×6): qty 1

## 2013-02-25 MED ORDER — LORAZEPAM 2 MG/ML IJ SOLN
1.0000 mg | Freq: Four times a day (QID) | INTRAMUSCULAR | Status: AC | PRN
Start: 2013-02-25 — End: 2013-02-28

## 2013-02-25 MED ORDER — LORAZEPAM 1 MG PO TABS: 1.0000 mg | ORAL_TABLET | Freq: Four times a day (QID) | ORAL | Status: AC | PRN

## 2013-02-25 MED ORDER — VITAMIN B-1 100 MG PO TABS
100.0000 mg | ORAL_TABLET | Freq: Every day | ORAL | Status: DC
  Administered 2013-02-27 – 2013-03-02 (×4): 100 mg via ORAL
  Filled 2013-02-25 (×6): qty 1

## 2013-02-25 MED ORDER — FOLIC ACID 1 MG PO TABS
1.0000 mg | ORAL_TABLET | Freq: Every day | ORAL | Status: DC
  Administered 2013-02-27 – 2013-03-02 (×4): 1 mg via ORAL
  Filled 2013-02-25 (×6): qty 1

## 2013-02-25 MED ORDER — PANTOPRAZOLE SODIUM 40 MG PO TBEC
40.0000 mg | DELAYED_RELEASE_TABLET | Freq: Every day | ORAL | Status: DC
  Administered 2013-02-25 – 2013-03-01 (×5): 40 mg via ORAL
  Filled 2013-02-25 (×5): qty 1

## 2013-02-25 MED ORDER — ADULT MULTIVITAMIN W/MINERALS CH
1.0000 | ORAL_TABLET | Freq: Every day | ORAL | Status: DC
  Administered 2013-02-27 – 2013-03-02 (×4): 1 via ORAL
  Filled 2013-02-25 (×6): qty 1

## 2013-02-25 NOTE — Progress Notes (Addendum)
Stroke Team Progress Note  HISTORY Eric May is an 53 y.o. male who was at work today 02/23/2013 and found down by co-workers with left sided weakness. The patient reports that he was doing well until he went to the bathroom at about 1845. He reports falling at that time due to weakness on the left. EMS was called and the [facility] was [facility].  Patient was not a TPA candidate secondary to ICH. He was admitted to the neuro ICU for further evaluation and treatment.  SUBJECTIVE Patient had 1min of GTC seizure activity overnight. Given ativan and keppra 1gm with resolution. Attempted repeat head CT unable to be performed due to agitation. Patient currently resting comfortably. Family at bedside. They note he has a strong EtOH abuse history, raise concern over whether he may be having withdrawal symptoms. Put back on cardene gtt for elevated BP.   OBJECTIVE Most recent Vital Signs: Filed Vitals:   02/25/13 0600 02/25/13 0615 02/25/13 0630 02/25/13 0700  BP: 149/89 153/92 160/89 148/86  Pulse: 71 87 87 68  Temp:    98.9 F (37.2 C)  TempSrc:    Axillary  Resp: 15 21 17 17   Height:      Weight:      SpO2: 100% 100% 99% 100%   CBG (last 3)   Recent Labs  02/23/13 2028  GLUCAP 79    IV Fluid Intake:   . niCARDipine 5 mg/hr (02/25/13 0700)    MEDICATIONS  . amLODipine  5 mg Oral Daily  . antiseptic oral rinse  15 mL Mouth Rinse BID  . levETIRAcetam  500 mg Intravenous Q12H  . pantoprazole (PROTONIX) IV  40 mg Intravenous QHS  . senna-docusate  1 tablet Oral BID   PRN:  acetaminophen, acetaminophen, LORazepam, ondansetron (ZOFRAN) IV  Diet:  Dysphagia 1 honey thick liquids Activity:  Bedrest DVT Prophylaxis:  SCDs   CLINICALLY SIGNIFICANT STUDIES Basic Metabolic Panel:   Recent Labs Lab 02/23/13 2016  NA 143  K 3.4*  CL 102  CO2 25  GLUCOSE 94  BUN 9  CREATININE 1.00  CALCIUM 9.8   Liver Function Tests:   Recent Labs Lab  02/23/13 2016  AST 19  ALT 12  ALKPHOS 77  BILITOT 0.3  PROT 8.1  ALBUMIN 4.8   CBC:   Recent Labs Lab 02/23/13 2016  WBC 6.1  NEUTROABS 4.1  HGB 14.8  HCT 44.2  MCV 88.8  PLT 241   Coagulation:   Recent Labs Lab 02/23/13 2016  LABPROT 12.5  INR 0.95   Cardiac Enzymes: No results found for this basename: CKTOTAL, CKMB, CKMBINDEX, TROPONINI,  in the last 168 hours Urinalysis:   Recent Labs Lab 02/23/13 2123  COLORURINE YELLOW  LABSPEC 1.016  PHURINE 6.5  GLUCOSEU NEGATIVE  HGBUR NEGATIVE  BILIRUBINUR NEGATIVE  KETONESUR NEGATIVE  PROTEINUR NEGATIVE  UROBILINOGEN 0.2  NITRITE NEGATIVE  LEUKOCYTESUR NEGATIVE   Lipid Panel No results found for this basename: chol,  trig,  hdl,  cholhdl,  vldl,  ldlcalc   HgbA1C  No results found for this basename: HGBA1C    Urine Drug Screen:     Component Value Date/Time   LABOPIA NONE DETECTED 02/23/2013 2123   COCAINSCRNUR POSITIVE* 02/23/2013 2123   LABBENZ NONE DETECTED 02/23/2013 2123   AMPHETMU NONE DETECTED 02/23/2013 2123   THCU POSITIVE* 02/23/2013 2123   LABBARB NONE DETECTED 02/23/2013 2123    Alcohol Level:   Recent Labs Lab 02/23/13 2016  ETH 31*   CT of the brain   02/25/2013 unable to complete 02/24/2013 Acute intraparenchymal hemorrhage in the right temporal parietal region appears similar to slightly larger compared to the head CT dated 02/23/2013. On the current head CT, there is now intraventricular hemorrhage, filling approximately 75% of the right lateral ventricle.  Right to left midline shift measures 5 mm (previously 4 mm).  02/23/2013   Acute parenchymal hemorrhage over the right temporal parietal region measuring 4.9 x 3.2 cm in its AP and transverse dimensions with local mass effect and midline shift to the left of 4 mm.    CT angio head   02/24/2013   1. No intracranial aneurysm identified. 2. Slight interval enlargement of right frontal temporal hematoma now measuring 5.2 x 3.3 cm,  previously 4.9 x 3.2 cm. Associated vasogenic edema is slightly increased. 4 mm of right-to-left midline shift is stable. 3. Interval development of intraventricular hemorrhage within the right lateral and 3rd ventricles. No hydrocephalus. 4. Mild stenosis of approximately 30% within the cavernous right ICA. No high-grade flow-limiting stenosis or occlusion identified within the intracranial circulation.    CT angio neck   02/24/2013   1. No high-grade stenosis, dissection, or other abnormality identified within the vasculature of the neck.     MRI of the brain  02/24/2013: No significant change in an intraparenchymal hematoma centered in the right lateral basal ganglia/ external capsule region measuring approximately 5.2 x 3.7 x 3.3 cm. Surrounding vasogenic edema. Right-to-left shift of 6 mm. Intraventricular penetration with blood in the right lateral ventricle. No hydrocephalus.     2D Echocardiogram    Carotid Doppler    CXR    EKG  Normal sinus rhythm. Possible Left atrial enlargement. Incomplete right bundle branch block. Left ventricular hypertrophy. Abnormal ECG.   Therapy Recommendations   Physical Exam   General Examination:  HEENT- Normocephalic, no lesions, without obvious abnormality.  Normal external nose, mucus membranes and septum.  Cardiovascular - S1, S2 normal  Lungs - chest clear, no wheezing, rales, normal symmetric air entry, Heart exam - S1, S2 normal, no murmur, no gallop, rate regular  Abdomen - soft, non-tender; bowel sounds normal; no masses, no organomegaly  Extremities - no edema  Neurologic Examination:  Mental Status:  Lethargic but easily arousable, oriented to name/January 2015, follows simple commands with Right side, Left neglect.  Cranial Nerves:  II: decreased blink to threat in the L VF, pupils equal, round, reactive to light III,IV, VI: ptosis not present, right gaze preference with patient unable to go beyond midline to the left.  V,VII: left facial  droop, facial light touch sensation decreased on the left  VIII: hearing normal bilaterally  XII: midline tongue extension  Motor:  Right : Upper extremity 5/5 Left: Upper extremity 0/5  Lower extremity 5/5 Lower extremity 3+/5  Tone and bulk:normal tone throughout; no atrophy noted  Sensory: Pinprick and light touch decreased on the left  Deep Tendon Reflexes: 2+ and symmetric throughout  Plantars:  Right: downgoing Left: mute  Cerebellar:  Patient not following command Gait: Unable to test     ASSESSMENT Mr. Eric May is a 53 y.o. male presenting with left sided numbness and weakness. Imaging confirms a right temporoparietal intracerebral hemorrhage with mass effect and 44 mm midline shift; cytotoxic cerebral edema and intraventricular extension. 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  On scheduled antithrombotics prior to admission.  Patient with resultant left hemiparesis, dysarthria, dysphagia. Neurosurgery following. Work up underway.  New acute generalized seizure last evening. Treated with ativan and Keppra. Malignant hypertension, 194/117 on arrival. Not on antihypertensives prior to admission. Weaned off cardene during the night, back on due to elebated BP. Remains on cardene drip this am. norvasc 5 mg started yesterday. Cigarette smoker etoh use, 6-pk/day THC position Cocaine positive  Hospital day # 2  TREATMENT/PLAN  Continue ICU level care  Supportive care recommended by NS  Repeat head CT today  Continue Keppra 500mg  BID  CIWA withdrawal protocol  OOB, therapy evals   Increase SBP goal to < 180.Increased Norvasc to 10mg  daily and wean cardene  Check Carotid doppler, 2D echo, Lipid panel (orders placed)  Annie Main, MSN, RN, ANVP-BC, ANP-BC, GNP-BC [facility] Pager: [phone] 02/25/2013 7:37 AM  I have personally obtained a history, examined the  patient, evaluated imaging results, and formulated the assessment and plan of care. I agree with the above.  This patient is critically ill and at significant risk of neurological worsening, death and care requires constant monitoring of vital signs, hemodynamics,respiratory and cardiac monitoring, neurological assessment,  other specialists and medical decision making of high complexity. A total of 35 minutes was spent in patient care.   Elspeth Cho, DO Neurology-Stroke

## 2013-02-25 NOTE — Progress Notes (Signed)
Rehab Admissions Coordinator Note:  Patient was screened by Trish MageLogue, Ressie Slevin M for appropriateness for an Inpatient Acute Rehab Consult.  At this time, we are recommending Inpatient Rehab consult.  Trish MageLogue, Amrom Ore M 02/25/2013, 3:44 PM  I can be reached at [phone].

## 2013-02-25 NOTE — Evaluation (Addendum)
Speech Language Pathology Evaluation Patient Details Name: Eric May MRN: [id number] DOB: Nov 23, 1960 Today's Date: 02/25/2013 Time: 1045-1100 SLP Time Calculation (min): 15 min  Problem List:  Patient Active Problem List   Diagnosis Date Noted  . Convulsions/seizures 02/24/2013  . Intracerebral hemorrhage 02/23/2013   Past Medical History:  Past Medical History  Diagnosis Date  . Hypertension    Past Surgical History: History reviewed. No pertinent past surgical history. HPI:  53 y.o. male presenting with left sided numbness and weakness. Imaging confirms a right temporoparietal intracerebral hemorrhage with mass effect and 44 mm midline shift; cytotoxic cerebral edema. 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   Assessment / Plan / Recommendation Clinical Impression  Limited assessment of cognition due to increasing agitation with stimulation. Primary deficit is intermittent focused attention and severely impaired sustained attention to verbal and functional tasks. This impacts memory, orientation, and safety awareness.  Left neglect observed.  Pt is moderately dysarthric due to left labial and lingual weakness. Verbal expression dominated by agitation and confusion, difficult to assess though likely intact. Pt will need ongoing SLP therapy for cognition and speech. CIR consult appropriate.     SLP Assessment  Patient needs continued Speech Lanaguage Pathology Services    Follow Up Recommendations  Inpatient Rehab    Frequency and Duration min 3x week  2 weeks   Pertinent Vitals/Pain NA   SLP Goals  SLP Goals Potential to Achieve Goals: Fair Potential Considerations: Cooperation/participation level  SLP Evaluation Prior Functioning  Cognitive/Linguistic Baseline: Within functional limits Type of Home: House Available Help at Discharge: Family;Available 24 hours/day   Cognition  Overall Cognitive Status: Impaired/Different from baseline Arousal/Alertness: Lethargic Orientation Level: Oriented to person;Disoriented to place;Disoriented to time;Disoriented to situation Attention: Focused Focused Attention: Impaired Focused Attention Impairment: Verbal basic;Functional basic Memory: Impaired Memory Impairment: Storage deficit;Decreased short term memory Decreased Short Term Memory: Verbal basic Awareness: Impaired Awareness Impairment: Intellectual impairment;Emergent impairment Problem Solving: Impaired Problem Solving Impairment: Verbal basic;Functional basic Executive Function: Reasoning;Self Monitoring Reasoning: Impaired Reasoning Impairment: Verbal basic;Functional basic Self Monitoring: Impaired Self Monitoring Impairment: Verbal basic;Functional basic Behaviors: Restless;Impulsive;Physical agitation;Verbal agitation;Poor frustration tolerance Safety/Judgment: Impaired    Comprehension  Auditory Comprehension Overall Auditory Comprehension: Impaired Yes/No Questions: Not tested Commands: Impaired One Step Basic Commands: 25-49% accurate Conversation: Simple Interfering Components: Attention EffectiveTechniques: Increased volume    Expression Verbal Expression Overall Verbal Expression: Other (comment) (Difficult to assess, pt not attending to questions) Initiation: No impairment Automatic Speech: Social Response Level of Generative/Spontaneous Verbalization: Phrase Interfering Components: Attention;Speech intelligibility Written Expression Dominant Hand: Right   Oral / Motor Oral Motor/Sensory Function Overall Oral Motor/Sensory Function: Impaired Labial ROM: Reduced left Labial Symmetry: Abnormal symmetry left Labial Strength: Reduced Labial Sensation: Reduced Lingual ROM:  (does not follow comamnds to assess, suspect weakness) Facial ROM: Reduced left Facial Symmetry: Left droop Facial Strength: Reduced Facial Sensation: Reduced Motor  Speech Overall Motor Speech: Impaired Respiration: Within functional limits Phonation: Normal Resonance: Within functional limits Articulation: Impaired Intelligibility: Intelligibility reduced Word: 50-74% accurate Phrase: 50-74% accurate Motor Planning: Witnin functional limits Motor Speech Errors: Unaware   GO    Harlon DittyBonnie Axten Pascucci, MA CCC-SLP [phone]  Claudine MoutonDeBlois, Horace Lukas Caroline 02/25/2013, 11:16 AM

## 2013-02-25 NOTE — Progress Notes (Signed)
Occupational Therapy Evaluation Patient Details Name: Eric May MRN: [id number] DOB: [birth year] Today's Date: 02/25/2013 Time: 0931-1000 OT Time Calculation (min): 29 min  OT Assessment / Plan / Recommendation History of present illness 53 yo with ICH - right lateral basal ganglia/ external capsule region measuring  approximately 5.2 x 3.7 x 3.3 cm. Surrounding vasogenic edema.  Right-to-left shift of 6 mm. History of polysubstance use.    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 Family states pt does not usually get up until @ 11am.  Pt very lethargic and agitated at times during eval. Decreased purposeful movement on L. Appears to demonstrate R gaze preference with dysconjugate gaze. Will attempt to see again to further assess mobility when pt is more alert and less agitated. Pt's mother states that she can provide 24/7 S after D/C. At this time, rec CIR for D/C. Pt will benefit from skilled OT services to facilitate D/C to next venue due to below deficits.    OT Assessment  Patient needs continued OT Services    Follow Up Recommendations  CIR;Supervision/Assistance - 24 hour    Barriers to Discharge      Equipment Recommendations  3 in 1 bedside comode    Recommendations for Other Services Rehab consult  Frequency  Min 3X/week    Precautions / Restrictions Precautions Precautions: Fall Precaution Comments: combative at times. ? ETOH withdrawal   Pertinent Vitals/Pain Vitals stable    ADL  Eating/Feeding: Other (comment) (modified diet) ADL Comments: total A with all ADL at this time    OT Diagnosis: Generalized weakness;Cognitive deficits;Disturbance of vision;Hemiplegia non-dominant side;Altered mental status  OT Problem List: Decreased strength;Decreased range of motion;Decreased activity tolerance;Impaired balance (sitting and/or standing);Impaired vision/perception;Decreased  coordination;Decreased cognition;Decreased safety awareness;Decreased knowledge of use of DME or AE;Decreased knowledge of precautions;Impaired sensation;Impaired tone;Impaired UE functional use OT Treatment Interventions: Self-care/ADL training;Therapeutic exercise;Neuromuscular education;DME and/or AE instruction;Therapeutic activities;Cognitive remediation/compensation;Visual/perceptual remediation/compensation;Patient/family education;Balance training   OT Goals(Current goals can be found in the care plan section) Acute Rehab OT Goals Patient Stated Goal: none stated Time For Goal Achievement: 03/11/13 Potential to Achieve Goals: Good  Visit Information  Last OT Received On: 02/25/13 Assistance Needed: +2 History of Present Illness: 53 yo with ICH - R BG/ext capsule       Prior Functioning     Home Living Family/patient expects to be discharged to:: Private residence Living Arrangements: Spouse/significant other;Parent Available Help at Discharge: Family;Available 24 hours/day Type of Home: House Home Access: Stairs to enter Entergy CorporationEntrance Stairs-Number of Steps: 4 Entrance Stairs-Rails: None Home Layout: One level Home Equipment: Bedside commode;Other (comment) (BSC belongs toMom) Prior Function Level of Independence: Independent 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 Will further assess. Perception Perception: Not tested Praxis Praxis: Not tested   Cognition  Cognition Arousal/Alertness:  Lethargic Behavior During Therapy: Agitated;Restless Overall Cognitive Status: Impaired/Different from baseline Area of Impairment: Orientation;Attention;Following commands;Safety/judgement;Awareness;Problem solving Orientation Level: Disoriented to;Place;Time;Situation Current Attention Level: Focused Following Commands: Follows one step commands inconsistently Safety/Judgement: Decreased awareness of safety;Decreased awareness of  deficits Awareness: Intellectual Problem Solving: Slow processing;Decreased initiation;Difficulty sequencing;Requires verbal cues;Requires tactile cues General Comments: will further assess    Extremity/Trunk Assessment Upper Extremity Assessment Upper Extremity Assessment: LUE deficits/detail LUE Deficits / Details: moving LUE spontaneously, however, is not using functionally. appears to demonstrate sensory deficits/decreased awareness of LUE during mobility LUE Sensation:  (appears to be decreased) LUE Coordination: decreased fine motor;decreased gross motor Lower Extremity Assessment Lower Extremity Assessment: LLE deficits/detail LLE Deficits / Details: moving spontaneously. but not using funcitonally Cervical / Trunk Assessment Cervical / Trunk Assessment:  (not assessed)     Mobility Bed Mobility Bed Mobility: Rolling Right;Rolling Left Rolling Right: 5: Supervision Rolling Left: 5: Supervision Details for Bed Mobility Assistance: would only roll side to side. Assisted with pushing self up to [facility] using RLE and RUE.  Transfers Transfers: Not assessed Details for Transfer Assistance: unable to encourage pt to participate in transfers at this time     Exercise     Balance Balance Balance Assessed: No   End of Session OT - End of Session Activity Tolerance: Patient limited by lethargy;Treatment limited secondary to agitation Patient left: in bed;with call bell/phone within reach;with family/visitor present Nurse Communication: Mobility  status;Other (comment) (lethargy)  GO     Marcas Bowsher,HILLARY 02/25/2013, 10:20 AM Luisa Dago, OTR/L  [phone] 02/25/2013

## 2013-02-25 NOTE — Progress Notes (Signed)
UR completed.  Johnjoseph Rolfe, RN BSN MHA CCM Trauma/Neuro ICU Case Manager [phone]  

## 2013-02-25 NOTE — Progress Notes (Signed)
PT Cancellation Note  Patient Details Name: Eric May MRN: [id number] DOB: [birth year]   Cancelled Treatment:    Reason Eval/Treat Not Completed: Medical issues which prohibited therapy;Patient's level of consciousness.  Patient with increased agitation this pm.  Deferred PT eval today.  Will return for PT evaluation in am.   [name], [name] 02/25/2013, 3:55 PM [name], PT, [facility] CenterMBA Acute Rehab Services Pager [phone]

## 2013-02-25 NOTE — Progress Notes (Signed)
0430 transported pt to CT. Pt became combative while attempting to transfer onto CT table. Unable to complete CT scan. MD notified, CT canceled. Will relay information to day shift nurse. Pt is now calm and resting.

## 2013-02-25 NOTE — Progress Notes (Signed)
Speech Language Pathology Treatment: Dysphagia  Patient Details Name: Eric May MRN: [id number] DOB: May 14, 1960 Today's Date: 02/25/2013 Time: 1045-1100 SLP Time Calculation (min): 15 min  Assessment / Plan / Recommendation Clinical Impression  Attempted diagnostic assessment to determine tolerance of Dys 1/honey thick diet or readiness for MBS. Pt is lethargic and requires max cues for awareness of PO. Significant evidence of oral and likely pharyngeal dysphagia persist but no evidence of aspiration seen with very minimal honey thick trials. Pt quickly becomes agitated, pulling and swinging. Would not be appropriate for MBS at this time. Recommend continuation of Dys 1/honey diet until mentation and cooperation improve.    HPI HPI: 53 y.o. male presenting with left sided numbness and weakness. Imaging confirms a right temporoparietal intracerebral hemorrhage with mass effect and 44 mm midline shift; cytotoxic cerebral edema. 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   Pertinent Vitals NA  SLP Plan  Continue with current plan of care    Recommendations Diet recommendations: Honey-thick liquid;Dysphagia 1 (puree) Liquids provided via: Teaspoon Medication Administration: Crushed with puree Supervision: Full supervision/cueing for compensatory strategies Compensations: Slow rate;Small sips/bites;Check for pocketing;Check for anterior loss Postural Changes and/or Swallow Maneuvers: Seated upright 90 degrees              General recommendations: Rehab consult Oral Care Recommendations: Oral care BID Follow up Recommendations: Inpatient Rehab Plan: Continue with current plan of care    GO    [facility] Hospital, LLCBonnie Conchetta Lamia, MA CCC-SLP [phone]  Claudine MoutonDeBlois, Baruc Tugwell Caroline 02/25/2013, 11:01 AM

## 2013-02-25 NOTE — Progress Notes (Signed)
  Echocardiogram 2D Echocardiogram has been performed.  Eric May, [name] 02/25/2013, 2:36 PM

## 2013-02-25 NOTE — Progress Notes (Signed)
*  PRELIMINARY RESULTS* Vascular Ultrasound Carotid Duplex (Doppler) has been completed.   Findings suggest 1-39% internal carotid artery stenosis bilaterally. Unable to visualize the right vertebral artery, the left vertebral artery is patent with antegrade flow.  02/25/2013 10:17 AM Gertie FeyMichelle Elven Laboy, RVT, RDCS, RDMS

## 2013-02-26 LAB — CBC WITH DIFFERENTIAL/PLATELET
BASOS ABS: 0 10*3/uL (ref 0.0–0.1)
BASOS PCT: 0 % (ref 0–1)
Eosinophils Absolute: 0 10*3/uL (ref 0.0–0.7)
Eosinophils Relative: 0 % (ref 0–5)
HCT: 45.4 % (ref 39.0–52.0)
Hemoglobin: 15.5 g/dL (ref 13.0–17.0)
Lymphocytes Relative: 16 % (ref 12–46)
Lymphs Abs: 2.2 10*3/uL (ref 0.7–4.0)
MCH: 30.2 pg (ref 26.0–34.0)
MCHC: 34.1 g/dL (ref 30.0–36.0)
MCV: 88.3 fL (ref 78.0–100.0)
Monocytes Absolute: 1.4 10*3/uL — ABNORMAL HIGH (ref 0.1–1.0)
Monocytes Relative: 10 % (ref 3–12)
NEUTROS ABS: 9.9 10*3/uL — AB (ref 1.7–7.7)
NEUTROS PCT: 73 % (ref 43–77)
PLATELETS: 222 10*3/uL (ref 150–400)
RBC: 5.14 MIL/uL (ref 4.22–5.81)
RDW: 14.1 % (ref 11.5–15.5)
WBC: 13.6 10*3/uL — ABNORMAL HIGH (ref 4.0–10.5)

## 2013-02-26 LAB — BASIC METABOLIC PANEL
BUN: 11 mg/dL (ref 6–23)
CHLORIDE: 104 meq/L (ref 96–112)
CO2: 24 mEq/L (ref 19–32)
Calcium: 9.7 mg/dL (ref 8.4–10.5)
Creatinine, Ser: 0.76 mg/dL (ref 0.50–1.35)
Glucose, Bld: 116 mg/dL — ABNORMAL HIGH (ref 70–99)
POTASSIUM: 3.5 meq/L — AB (ref 3.7–5.3)
Sodium: 142 mEq/L (ref 137–147)

## 2013-02-26 LAB — LIPID PANEL
CHOL/HDL RATIO: 2.3 ratio
CHOLESTEROL: 219 mg/dL — AB (ref 0–200)
HDL: 96 mg/dL (ref >39)
LDL CALC: 110 mg/dL — AB (ref 0–99)
TRIGLYCERIDES: 63 mg/dL (ref <150)
VLDL: 13 mg/dL (ref 0–40)

## 2013-02-26 MED ORDER — HYDROCHLOROTHIAZIDE 12.5 MG PO CAPS
12.5000 mg | ORAL_CAPSULE | Freq: Every day | ORAL | Status: DC
  Administered 2013-02-26: 12.5 mg via ORAL
  Filled 2013-02-26 (×2): qty 1

## 2013-02-26 MED ORDER — NICARDIPINE HCL IN NACL 40-0.83 MG/200ML-% IV SOLN
3.0000 mg/h | INTRAVENOUS | Status: DC
  Administered 2013-02-26: 5 mg/h via INTRAVENOUS
  Administered 2013-02-27 (×2): 2.5 mg/h via INTRAVENOUS
  Administered 2013-02-28: 3 mg/h via INTRAVENOUS
  Filled 2013-02-26 (×4): qty 200

## 2013-02-26 NOTE — Progress Notes (Addendum)
Stroke Team Progress Note  HISTORY Eric May is an 53 y.o. male who was at work today 02/23/2013 and found down by co-workers with left sided weakness. The patient reports that he was doing well until he went to the bathroom at about 1845. He reports falling at that time due to weakness on the left. EMS was called and the patient was brought in to [facility] for evaluation.  Patient was not a TPA candidate secondary to ICH. He was admitted to the neuro ICU for further evaluation and treatment.  SUBJECTIVE Had repeat head CT overnight. Remains on cardene gtt. Per RN, he is not taking his PO meds. Remains agitated, non-compliant with staff. Wakes up easily, mumbles his name and then pushes me away, refusing to answer questions.   OBJECTIVE Most recent Vital Signs: Filed Vitals:   02/26/13 0400 02/26/13 0500 02/26/13 0600 02/26/13 0700  BP: 164/90 153/87 165/91 143/91  Pulse: 72 69 67 69  Temp:    99.9 F (37.7 C)  TempSrc:    Oral  Resp: 21 18    Height:      Weight:  138 lb 3.7 oz (62.7 kg)    SpO2: 98% 99% 97% 99%   CBG (last 3)   Recent Labs  02/23/13 2028  GLUCAP 79    IV Fluid Intake:   . niCARDipine      MEDICATIONS  . amLODipine  10 mg Oral Daily  . antiseptic oral rinse  15 mL Mouth Rinse BID  . folic acid  1 mg Oral Daily  . levETIRAcetam  500 mg Intravenous Q12H  . multivitamin with minerals  1 tablet Oral Daily  . pantoprazole  40 mg Oral QHS  . senna-docusate  1 tablet Oral BID  . thiamine  100 mg Oral Daily   PRN:  acetaminophen, acetaminophen, LORazepam, LORazepam, LORazepam, ondansetron (ZOFRAN) IV  Diet:  Dysphagia 1 honey thick liquids Activity:  Bedrest DVT Prophylaxis:  SCDs   CLINICALLY SIGNIFICANT STUDIES Basic Metabolic Panel:   Recent Labs Lab 02/23/13 2016  NA 143  K 3.4*  CL 102  CO2 25  GLUCOSE 94  BUN 9  CREATININE 1.00  CALCIUM 9.8   Liver Function Tests:   Recent Labs Lab 02/23/13 2016  AST 19  ALT 12  ALKPHOS 77   BILITOT 0.3  PROT 8.1  ALBUMIN 4.8   CBC:   Recent Labs Lab 02/23/13 2016  WBC 6.1  NEUTROABS 4.1  HGB 14.8  HCT 44.2  MCV 88.8  PLT 241   Coagulation:   Recent Labs Lab 02/23/13 2016  LABPROT 12.5  INR 0.95   Cardiac Enzymes: No results found for this basename: CKTOTAL, CKMB, CKMBINDEX, TROPONINI,  in the last 168 hours Urinalysis:   Recent Labs Lab 02/23/13 2123  COLORURINE YELLOW  LABSPEC 1.016  PHURINE 6.5  GLUCOSEU NEGATIVE  HGBUR NEGATIVE  BILIRUBINUR NEGATIVE  KETONESUR NEGATIVE  PROTEINUR NEGATIVE  UROBILINOGEN 0.2  NITRITE NEGATIVE  LEUKOCYTESUR NEGATIVE   Lipid Panel    Component Value Date/Time   CHOL 219* 02/26/2013 0420   HgbA1C  No results found for this basename: HGBA1C    Urine Drug Screen:     Component Value Date/Time   LABOPIA NONE DETECTED 02/23/2013 2123   COCAINSCRNUR POSITIVE* 02/23/2013 2123   LABBENZ NONE DETECTED 02/23/2013 2123   AMPHETMU NONE DETECTED 02/23/2013 2123   THCU POSITIVE* 02/23/2013 2123   LABBARB NONE DETECTED 02/23/2013 2123    Alcohol Level:   Recent Labs Lab  02/23/13 2016  ETH 31*   CT of the brain   02/25/2013 unable to complete 02/24/2013 Acute intraparenchymal hemorrhage in the right temporal parietal region appears similar to slightly larger compared to the head CT dated 02/23/2013. On the current head CT, there is now intraventricular hemorrhage, filling approximately 75% of the right lateral ventricle.  Right to left midline shift measures 5 mm (previously 4 mm).  02/23/2013   Acute parenchymal hemorrhage over the right temporal parietal region measuring 4.9 x 3.2 cm in its AP and transverse dimensions with local mass effect and midline shift to the left of 4 mm.    CT angio head   02/24/2013   1. No intracranial aneurysm identified. 2. Slight interval enlargement of right frontal temporal hematoma now measuring 5.2 x 3.3 cm, previously 4.9 x 3.2 cm. Associated vasogenic edema is slightly increased. 4  mm of right-to-left midline shift is stable. 3. Interval development of intraventricular hemorrhage within the right lateral and 3rd ventricles. No hydrocephalus. 4. Mild stenosis of approximately 30% within the cavernous right ICA. No high-grade flow-limiting stenosis or occlusion identified within the intracranial circulation.    CT angio neck   02/24/2013   1. No high-grade stenosis, dissection, or other abnormality identified within the vasculature of the neck.     MRI of the brain  02/24/2013: No significant change in an intraparenchymal hematoma centered in the right lateral basal ganglia/ external capsule region measuring approximately 5.2 x 3.7 x 3.3 cm. Surrounding vasogenic edema. Right-to-left shift of 6 mm. Intraventricular penetration with blood in the right lateral ventricle. No hydrocephalus.     2D Echocardiogram   Normal LV size and systolic function, EF 60-65%. Normal RV size and systolic function. No significant valvular abnormalities.   Carotid Doppler Findings suggest 1-39% internal carotid artery stenosis bilaterally. Unable to visualize the right vertebral artery, the left vertebral artery is patent with antegrade flow.    CXR    EKG  Normal sinus rhythm. Possible Left atrial enlargement. Incomplete right bundle branch block. Left ventricular hypertrophy. Abnormal ECG.   Therapy Recommendations pending, patient agitated during evaluations  Physical Exam   General Examination:  HEENT- Normocephalic, no lesions, without obvious abnormality.  Normal external nose, mucus membranes and septum.  Cardiovascular - S1, S2 normal  Lungs - chest clear, no wheezing, rales, normal symmetric air entry, Heart exam - S1, S2 normal, no murmur, no gallop, rate regular  Abdomen - soft, non-tender; bowel sounds normal; no masses, no organomegaly  Extremities - no edema  Neurologic Examination:  Mental Status:  Lethargic but easily arousable, oriented to name/January 2015, follows simple  commands with Right side, Left neglect.  Cranial Nerves:  II: decreased blink to threat in the L VF, pupils equal, round, reactive to light III,IV, VI: ptosis not present, right gaze preference with patient unable to go beyond midline to the left.  V,VII: left facial droop, facial light touch sensation decreased on the left  VIII: hearing normal bilaterally  XII: midline tongue extension  Motor:  Right : Upper extremity 5/5 Left: Upper extremity 0/5  Lower extremity 5/5 Lower extremity 3+/5  Tone and bulk:normal tone throughout; no atrophy noted  Sensory: Pinprick and light touch decreased on the left  Deep Tendon Reflexes: 2+ and symmetric throughout  Plantars:  Right: downgoing Left: mute  Cerebellar:  Patient not following command Gait: Unable to test     ASSESSMENT Mr. Eric May is a 53 y.o. male presenting with left sided numbness and  weakness. Imaging confirms a right temporoparietal intracerebral hemorrhage with mass effect and 44 mm midline shift; cytotoxic cerebral edema and intraventricular extension. 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  On no scheduled antithrombotics prior to admission. Patient with resultant left hemiparesis, dysarthria, dysphagia. Neurosurgery following. Work up underway.  New acute generalized seizure. Treated with ativan and Keppra. Malignant hypertension, 194/117 on arrival. Not on antihypertensives prior to admission. Weaned off cardene, back on due to elevated BP. Remains on cardene drip this am. Started Norvasc 10mg , added HCTZ 12.5mg . RN reports patient refusing PO meds Cigarette smoker etoh use, 6-pk/day THC position Cocaine positive Hyperlipidemia  Hospital day # 3  TREATMENT/PLAN  Continue ICU level care  Supportive care recommended by NS  Repeat head CT stable/slightly improved  Continue Keppra 500mg  BID  Check CBC and BMP  CIWA  withdrawal protocol, continue thiamine and folic acid supplement  OOB, therapy evals   Increase SBP goal to < 180.Continue Norvasc 10mg  daily, add HCTZ 12.5mg  daily. Wean cardene gtt  LDL 110. Start statin prior to discharge    This patient is critically ill and at significant risk of neurological worsening, death and care requires constant monitoring of vital signs, hemodynamics,respiratory and cardiac monitoring, neurological assessment,  other specialists and medical decision making of high complexity. A total of 35 minutes was spent in patient care.   Elspeth Cho, DO Neurology-Stroke

## 2013-02-26 NOTE — Evaluation (Signed)
Physical Therapy Evaluation Patient Details Name: Eric May MRN: [id number] DOB: 10/01/1960 Today's Date: 02/26/2013 Time: 2841-32441100-1118 PT Time Calculation (min): 18 min  PT Assessment / Plan / Recommendation History of Present Illness  53 yo with ICH - R BG/ext capsule  Clinical Impression  Patient presents with increased agitation and definite cognitive involvement.  Patient also with weakness of left upper and lower extremity impacting mobility and independence.  Patient will benefit from PT to assist with increased functional mobility, balance and cognitive remediation.  Feel patient would benefit for CIR to achieve max potential for ultimate discharge home with family.    PT Assessment  Patient needs continued PT services    Follow Up Recommendations  CIR    Does the patient have the potential to tolerate intense rehabilitation      Barriers to Discharge        Equipment Recommendations  Other (comment) (tba with further mobilitiy)    Recommendations for Other Services Rehab consult   Frequency Min 3X/week    Precautions / Restrictions Precautions Precautions: Fall Precaution Comments: combative at times. ? ETOH withdrawal   Pertinent Vitals/Pain Patient did not indicate any pain      Mobility  Bed Mobility Bed Mobility: Supine to Sit;Sit to Supine Supine to Sit: 1: +2 Total assist Supine to Sit: Patient Percentage: 70% Sit to Supine: 3: Mod assist Details for Bed Mobility Assistance: patient lethargic, helped to sitting side of bed and patient become more alert. patient laid himself back in bed, but required assistance to reposition.    Exercises     PT Diagnosis: Hemiplegia non-dominant side  PT Problem List: Decreased activity tolerance;Decreased balance;Decreased mobility;Decreased cognition;Decreased knowledge of use of DME;Decreased safety awareness;Decreased strength PT Treatment Interventions: Gait training;Functional mobility training;Therapeutic  activities;Therapeutic exercise;Patient/family education;Cognitive remediation;Neuromuscular re-education;Balance training     PT Goals(Current goals can be found in the care plan section) Acute Rehab PT Goals Patient Stated Goal: none stated PT Goal Formulation: With family Time For Goal Achievement: 03/12/13 Potential to Achieve Goals: Good  Visit Information  Last PT Received On: 02/26/13 Assistance Needed: +2 History of Present Illness: 53 yo with ICH - R BG/ext capsule       Prior Functioning       Cognition  Cognition Arousal/Alertness: Lethargic Behavior During Therapy: Agitated;Restless Overall Cognitive Status: Impaired/Different from baseline Area of Impairment: Orientation;Attention;Following commands;Safety/judgement;Awareness;Problem solving Orientation Level: Disoriented to;Place;Time;Situation Current Attention Level: Focused Following Commands: Follows one step commands inconsistently Safety/Judgement: Decreased awareness of safety;Decreased awareness of deficits General Comments: will further assess    Extremity/Trunk Assessment Lower Extremity Assessment Lower Extremity Assessment: RLE deficits/detail RLE Deficits / Details: moving spontaneously, appears within functional limits, unable to accurately test due to patient unable to follow commands LLE Deficits / Details: did not see any spontaneous movement; unable to follow commands for formal testing Cervical / Trunk Assessment Cervical / Trunk Assessment: Normal   Balance Balance Balance Assessed: Yes Static Sitting Balance Static Sitting - Balance Support: Right upper extremity supported;Feet supported Static Sitting - Level of Assistance: 3: Mod assist Static Sitting - Comment/# of Minutes: patient agitated and restless, difficult to formally test, able to lean on leg with right UE and hold self upright; to sit without UE support required min - mod assist.  End of Session PT - End of Session Activity  Tolerance: Treatment limited secondary to agitation Patient left: in bed;with call bell/phone within reach;with family/visitor present  GP     Olivia CanterMoton, Rahkim Rabalais M, [place]PT [phone] 02/26/2013,  11:32 AM

## 2013-02-27 LAB — BASIC METABOLIC PANEL
BUN: 14 mg/dL (ref 6–23)
CHLORIDE: 100 meq/L (ref 96–112)
CO2: 25 meq/L (ref 19–32)
Calcium: 10 mg/dL (ref 8.4–10.5)
Creatinine, Ser: 0.82 mg/dL (ref 0.50–1.35)
GFR calc non Af Amer: >90 mL/min (ref >90)
Glucose, Bld: 104 mg/dL — ABNORMAL HIGH (ref 70–99)
Potassium: 3.6 mEq/L — ABNORMAL LOW (ref 3.7–5.3)
Sodium: 139 mEq/L (ref 137–147)

## 2013-02-27 LAB — CBC WITH DIFFERENTIAL/PLATELET
BASOS ABS: 0 10*3/uL (ref 0.0–0.1)
BASOS PCT: 0 % (ref 0–1)
Eosinophils Absolute: 0 10*3/uL (ref 0.0–0.7)
Eosinophils Relative: 0 % (ref 0–5)
HCT: 48 % (ref 39.0–52.0)
HEMOGLOBIN: 16.2 g/dL (ref 13.0–17.0)
LYMPHS PCT: 22 % (ref 12–46)
Lymphs Abs: 2.7 10*3/uL (ref 0.7–4.0)
MCH: 29.9 pg (ref 26.0–34.0)
MCHC: 33.8 g/dL (ref 30.0–36.0)
MCV: 88.6 fL (ref 78.0–100.0)
Monocytes Absolute: 1.6 10*3/uL — ABNORMAL HIGH (ref 0.1–1.0)
Monocytes Relative: 13 % — ABNORMAL HIGH (ref 3–12)
NEUTROS ABS: 7.9 10*3/uL — AB (ref 1.7–7.7)
NEUTROS PCT: 64 % (ref 43–77)
Platelets: 247 10*3/uL (ref 150–400)
RBC: 5.42 MIL/uL (ref 4.22–5.81)
RDW: 14 % (ref 11.5–15.5)
WBC: 12.2 10*3/uL — ABNORMAL HIGH (ref 4.0–10.5)

## 2013-02-27 MED ORDER — LEVETIRACETAM 500 MG PO TABS
500.0000 mg | ORAL_TABLET | Freq: Two times a day (BID) | ORAL | Status: DC
  Administered 2013-02-27 – 2013-03-02 (×6): 500 mg via ORAL
  Filled 2013-02-27 (×7): qty 1

## 2013-02-27 MED ORDER — HYDROCHLOROTHIAZIDE 25 MG PO TABS
25.0000 mg | ORAL_TABLET | Freq: Every day | ORAL | Status: DC
  Administered 2013-02-27 – 2013-03-02 (×4): 25 mg via ORAL
  Filled 2013-02-27 (×4): qty 1

## 2013-02-27 NOTE — Progress Notes (Addendum)
Stroke Team Progress Note  HISTORY Eric May is an 53 y.o. male who was at work today 02/23/2013 and found down by co-workers with left sided weakness. The patient reports that he was doing well until he went to the bathroom at about 1845. He reports falling at that time due to weakness on the left. EMS was called and the patient was brought in to [facility] for evaluation.  Patient was not a TPA candidate secondary to ICH. He was admitted to the neuro ICU for further evaluation and treatment.  SUBJECTIVE Per RN no overnight events, remains lethargic and agitated when aroused. Briefly off cardene gtt but currently on low rate.   OBJECTIVE Most recent Vital Signs: Filed Vitals:   02/27/13 0630 02/27/13 0645 02/27/13 0700 02/27/13 0715  BP: 131/94 133/95 128/91 146/99  Pulse: 70 66 64 62  Temp:      TempSrc:      Resp: 13 13 18 13   Height:      Weight:      SpO2: 99% 97% 97% 98%   CBG (last 3)  No results found for this basename: GLUCAP,  in the last 72 hours  IV Fluid Intake:   . niCARDipine 2.5 mg/hr (02/27/13 0700)    MEDICATIONS  . amLODipine  10 mg Oral Daily  . antiseptic oral rinse  15 mL Mouth Rinse BID  . folic acid  1 mg Oral Daily  . hydrochlorothiazide  12.5 mg Oral Daily  . levETIRAcetam  500 mg Intravenous Q12H  . multivitamin with minerals  1 tablet Oral Daily  . pantoprazole  40 mg Oral QHS  . senna-docusate  1 tablet Oral BID  . thiamine  100 mg Oral Daily   PRN:  acetaminophen, acetaminophen, LORazepam, LORazepam, LORazepam, ondansetron (ZOFRAN) IV  Diet:  Dysphagia 1 honey thick liquids Activity:  Bedrest DVT Prophylaxis:  SCDs   CLINICALLY SIGNIFICANT STUDIES Basic Metabolic Panel:   Recent Labs Lab 02/26/13 1053 02/27/13 0400  NA 142 139  K 3.5* 3.6*  CL 104 100  CO2 24 25  GLUCOSE 116* 104*  BUN 11 14  CREATININE 0.76 0.82  CALCIUM 9.7 10.0   Liver Function Tests:   Recent Labs Lab 02/23/13 2016  AST 19  ALT 12  ALKPHOS 77   BILITOT 0.3  PROT 8.1  ALBUMIN 4.8   CBC:   Recent Labs Lab 02/26/13 1053 02/27/13 0400  WBC 13.6* 12.2*  NEUTROABS 9.9* 7.9*  HGB 15.5 16.2  HCT 45.4 48.0  MCV 88.3 88.6  PLT 222 247   Coagulation:   Recent Labs Lab 02/23/13 2016  LABPROT 12.5  INR 0.95   Cardiac Enzymes: No results found for this basename: CKTOTAL, CKMB, CKMBINDEX, TROPONINI,  in the last 168 hours Urinalysis:   Recent Labs Lab 02/23/13 2123  COLORURINE YELLOW  LABSPEC 1.016  PHURINE 6.5  GLUCOSEU NEGATIVE  HGBUR NEGATIVE  BILIRUBINUR NEGATIVE  KETONESUR NEGATIVE  PROTEINUR NEGATIVE  UROBILINOGEN 0.2  NITRITE NEGATIVE  LEUKOCYTESUR NEGATIVE   Lipid Panel    Component Value Date/Time   CHOL 219* 02/26/2013 0420   HgbA1C  No results found for this basename: HGBA1C    Urine Drug Screen:     Component Value Date/Time   LABOPIA NONE DETECTED 02/23/2013 2123   COCAINSCRNUR POSITIVE* 02/23/2013 2123   LABBENZ NONE DETECTED 02/23/2013 2123   AMPHETMU NONE DETECTED 02/23/2013 2123   THCU POSITIVE* 02/23/2013 2123   LABBARB NONE DETECTED 02/23/2013 2123    Alcohol Level:  Recent Labs Lab 02/23/13 2016  ETH 31*   CT of the brain   02/25/2013 unable to complete 02/24/2013 Acute intraparenchymal hemorrhage in the right temporal parietal region appears similar to slightly larger compared to the head CT dated 02/23/2013. On the current head CT, there is now intraventricular hemorrhage, filling approximately 75% of the right lateral ventricle.  Right to left midline shift measures 5 mm (previously 4 mm).  02/23/2013   Acute parenchymal hemorrhage over the right temporal parietal region measuring 4.9 x 3.2 cm in its AP and transverse dimensions with local mass effect and midline shift to the left of 4 mm.    CT angio head   02/24/2013   1. No intracranial aneurysm identified. 2. Slight interval enlargement of right frontal temporal hematoma now measuring 5.2 x 3.3 cm, previously 4.9 x 3.2 cm.  Associated vasogenic edema is slightly increased. 4 mm of right-to-left midline shift is stable. 3. Interval development of intraventricular hemorrhage within the right lateral and 3rd ventricles. No hydrocephalus. 4. Mild stenosis of approximately 30% within the cavernous right ICA. No high-grade flow-limiting stenosis or occlusion identified within the intracranial circulation.    CT angio neck   02/24/2013   1. No high-grade stenosis, dissection, or other abnormality identified within the vasculature of the neck.     MRI of the brain  02/24/2013: No significant change in an intraparenchymal hematoma centered in the right lateral basal ganglia/ external capsule region measuring approximately 5.2 x 3.7 x 3.3 cm. Surrounding vasogenic edema. Right-to-left shift of 6 mm. Intraventricular penetration with blood in the right lateral ventricle. No hydrocephalus.     2D Echocardiogram   Normal LV size and systolic function, EF 60-65%. Normal RV size and systolic function. No significant valvular abnormalities.   Carotid Doppler Findings suggest 1-39% internal carotid artery stenosis bilaterally. Unable to visualize the right vertebral artery, the left vertebral artery is patent with antegrade flow.    CXR    EKG  Normal sinus rhythm. Possible Left atrial enlargement. Incomplete right bundle branch block. Left ventricular hypertrophy. Abnormal ECG.   Therapy Recommendations pending, patient agitated during evaluations  Physical Exam   General Examination:  HEENT- Normocephalic, no lesions, without obvious abnormality.  Normal external nose, mucus membranes and septum.  Cardiovascular - S1, S2 normal  Lungs - chest clear, no wheezing, rales, normal symmetric air entry, Heart exam - S1, S2 normal, no murmur, no gallop, rate regular  Abdomen - soft, non-tender; bowel sounds normal; no masses, no organomegaly  Extremities - no edema  Neurologic Examination:  Mental Status:  Lethargic but easily  arousable, oriented to name/2015 but not day or month, follows simple commands with Right side, Left neglect.  Cranial Nerves:  II: decreased blink to threat in the L VF, pupils equal, round, reactive to light III,IV, VI: ptosis not present, right gaze preference with patient unable to go beyond midline to the left.  V,VII: left facial droop, facial light touch sensation decreased on the left  VIII: hearing normal bilaterally  XII: midline tongue extension  Motor:  Right : Upper extremity 5/5 Left: Upper extremity 0/5  Lower extremity 5/5 Lower extremity 3+/5  Tone and bulk:normal tone throughout; no atrophy noted  Sensory: Pinprick and light touch decreased on the left  Deep Tendon Reflexes: 2+ and symmetric throughout  Plantars:  Right: downgoing Left: mute  Cerebellar:  Patient not following command Gait: Unable to test     ASSESSMENT Mr. Eric May is a 53 y.o.  male presenting with left sided numbness and weakness. Imaging confirms a right temporoparietal intracerebral hemorrhage with mass effect and 44 mm midline shift; cytotoxic cerebral edema and intraventricular extension. 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  On no scheduled antithrombotics prior to admission. Patient with resultant left hemiparesis, dysarthria, dysphagia. Work up underway.  New acute generalized seizure. Treated with ativan and Keppra. Malignant hypertension, 194/117 on arrival. Not on antihypertensives prior to admission. Weaned off cardene, back on due to elevated BP. Remains on cardene drip this am. Started Norvasc 10mg , added HCTZ 12.5mg . RN reports patient refusing PO meds Cigarette smoker etoh use, 6-pk/day THC position Cocaine positive Hyperlipidemia Leukocytosis  Hospital day # 4  TREATMENT/PLAN  Continue ICU level care  Supportive care recommended by NS  Repeat head CT stable/slightly  improved  Continue Keppra 500mg  BID  No further signs of clinical or subclinical seizure, no indication for EEG at this time  IWA withdrawal protocol, continue thiamine and folic acid supplement  Leukocytosis improving, afebrile  Will check hepatic function, ammonia, TSH, B12 for possible causes of lethargy  OOB, therapy evals   Increase SBP goal to < 180.Continue Norvasc 10mg  daily, increase Hctz to 25mg . Wean cardene gtt  LDL 110. Start statin prior to discharge    This patient is critically ill and at significant risk of neurological worsening, death and care requires constant monitoring of vital signs, hemodynamics,respiratory and cardiac monitoring, neurological assessment,  other specialists and medical decision making of high complexity. A total of 35 minutes was spent in patient care.   Elspeth ChoPeter Anabel Lykins, DO Neurology-Stroke

## 2013-02-28 DIAGNOSIS — I619 Nontraumatic intracerebral hemorrhage, unspecified: Secondary | ICD-10-CM

## 2013-02-28 LAB — HEPATIC FUNCTION PANEL
ALT: 9 U/L (ref 0–53)
AST: 10 U/L (ref 0–37)
Albumin: 4 g/dL (ref 3.5–5.2)
Alkaline Phosphatase: 86 U/L (ref 39–117)
Bilirubin, Direct: <0.2 mg/dL (ref 0.0–0.3)
TOTAL PROTEIN: 8.1 g/dL (ref 6.0–8.3)
Total Bilirubin: 0.7 mg/dL (ref 0.3–1.2)

## 2013-02-28 LAB — TROPONIN I

## 2013-02-28 LAB — CK TOTAL AND CKMB (NOT AT ARMC)
CK, MB: 0.7 ng/mL (ref 0.3–4.0)
Relative Index: INVALID (ref 0.0–2.5)
Total CK: 36 U/L (ref 7–232)

## 2013-02-28 LAB — VITAMIN B12: Vitamin B-12: 555 pg/mL (ref 211–911)

## 2013-02-28 LAB — AMMONIA: Ammonia: 55 umol/L (ref 11–60)

## 2013-02-28 LAB — TSH: TSH: 3.299 u[IU]/mL (ref 0.350–4.500)

## 2013-02-28 MED ORDER — SODIUM CHLORIDE 0.9 % IV SOLN
INTRAVENOUS | Status: DC
  Administered 2013-02-28 – 2013-03-01 (×2): via INTRAVENOUS
  Administered 2013-03-02: 1000 mL via INTRAVENOUS

## 2013-02-28 MED ORDER — POTASSIUM CHLORIDE CRYS ER 20 MEQ PO TBCR
20.0000 meq | EXTENDED_RELEASE_TABLET | Freq: Every day | ORAL | Status: DC
  Administered 2013-03-01 – 2013-03-02 (×2): 20 meq via ORAL
  Filled 2013-02-28 (×2): qty 1

## 2013-02-28 MED ORDER — POTASSIUM CHLORIDE CRYS ER 20 MEQ PO TBCR
40.0000 meq | EXTENDED_RELEASE_TABLET | Freq: Once | ORAL | Status: AC
  Administered 2013-02-28: 40 meq via ORAL
  Filled 2013-02-28: qty 2

## 2013-02-28 MED ORDER — LABETALOL HCL 5 MG/ML IV SOLN: 10.0000 mg | INTRAVENOUS | Status: DC | PRN

## 2013-02-28 NOTE — Progress Notes (Signed)
Physical Therapy Treatment Patient Details Name: Eric May MRN: [id number] DOB: Feb 12, 1961 Today's Date: 02/28/2013 Time: [id number] PT Time Calculation (min): 32 min  PT Assessment / Plan / Recommendation  History of Present Illness 53 yo with ICH - R BG/ext capsule   PT Comments   Pt with improved orientation and no longer combative however con't to be easily agitated. Pt with consistant complaint of L flank pain with all transfers and mobility limited ambulation this date. Pt con't to have L sided neglect and weakness. Pt cont' to benefit from CIR upon d/c for maximal functional recovery.  Follow Up Recommendations  CIR     Does the patient have the potential to tolerate intense rehabilitation     Barriers to Discharge        Equipment Recommendations       Recommendations for Other Services Rehab consult  Frequency Min 4X/week   Progress towards PT Goals Progress towards PT goals: Progressing toward goals  Plan Frequency needs to be updated    Precautions / Restrictions Precautions Precautions: Fall Precaution Comments: pt easily agitated, noted L sided neglect Restrictions Weight Bearing Restrictions: No   Pertinent Vitals/Pain Unable to rate but constantly complaining of L flank pain t/o session    Mobility  Bed Mobility Bed Mobility: Supine to Sit;Sitting - Scoot to Edge of Bed Supine to Sit: 1: +2 Total assist Supine to Sit: Patient Percentage: 50% Sitting - Scoot to Edge of Bed: 1: +2 Total assist Sitting - Scoot to Edge of Bed: Patient Percentage: 50% Details for Bed Mobility Assistance: max directional verbal and tactile cues to complete task. pt screamed out in pain during transfer. Pt reports "left side" pain Transfers Transfers: Sit to Stand;Stand to Sit;Stand Pivot Transfers Sit to Stand: 1: +2 Total assist Sit to Stand: Patient Percentage: 50% Stand to Sit: 1: +2 Total assist Stand to Sit: Patient Percentage: 50% Stand Pivot Transfers: 1: +2 Total  assist Stand Pivot Transfers: Patient Percentage: 50% Details for Transfer Assistance: Pt with noted L knee buckling requiring constant blocking of knee. pt with difficulty sequencing stepping to  transfer to chair. pt screamed out in pain when attempting to lift R LE causing increased L LE weight-bearing. Pt  Ambulation/Gait Ambulation/Gait Assistance: Not tested (comment) Modified Rankin (Stroke Patients Only) Pre-Morbid Rankin Score: No symptoms Modified Rankin: Severe disability    Exercises     PT Diagnosis:    PT Problem List:   PT Treatment Interventions:     PT Goals (current goals can now be found in the care plan section)    Visit Information  Last PT Received On: 02/28/13 Assistance Needed: +2 History of Present Illness: 53 yo with ICH - R BG/ext capsule    Subjective Data      Cognition  Cognition Arousal/Alertness: Lethargic Behavior During Therapy: Restless;Agitated Overall Cognitive Status: Impaired/Different from baseline Area of Impairment: Attention;Following commands;Safety/judgement;Awareness;Problem solving Orientation Level:  (pt oriented this date) Current Attention Level: Focused Memory: Decreased short-term memory Following Commands: Follows one step commands with increased time Safety/Judgement: Decreased awareness of safety;Decreased awareness of deficits Awareness: Intellectual Problem Solving: Slow processing;Decreased initiation;Requires verbal cues;Difficulty sequencing;Requires tactile cues General Comments: pt easily frustrated    Balance  Balance Balance Assessed: Yes Static Sitting Balance Static Sitting - Balance Support: No upper extremity supported;Feet supported Static Sitting - Level of Assistance: 3: Mod assist Static Sitting - Comment/# of Minutes: pt with increased trunk flexion, v/c's to maintain upright posture. pt with R lateral bias Static Standing  Balance Static Standing - Balance Support: Bilateral upper extremity  supported Static Standing - Level of Assistance: 1: +2 Total assist Static Standing - Comment/# of Minutes: max v/c's to maintain back extension, blocking of L knee provided due to buckling  End of Session PT - End of Session Equipment Utilized During Treatment: Gait belt Activity Tolerance: Patient limited by fatigue;Treatment limited secondary to agitation Patient left: in chair;with call bell/phone within reach;with family/visitor present Nurse Communication: Mobility status   GP     Marcene Brawn 02/28/2013, 1:28 PM  Lewis Shock, PT, DPT Pager #: [phone] Office #: [phone]

## 2013-02-28 NOTE — Progress Notes (Signed)
Stroke Team Progress Note  HISTORY Eric May is an 53 y.o. male who was at work today 02/23/2013 and found down by co-workers with left sided weakness. The patient reports that he was doing well until he went to the bathroom at about 1845. He reports falling at that time due to weakness on the left. EMS was called and the patient was brought in to [facility] for evaluation.  Patient was not a TPA candidate secondary to ICH. He was admitted to the neuro ICU for further evaluation and treatment.  SUBJECTIVE Male at bedside, ? Girlfriend (who appears to have some basic medical knowledge, ? NA).  OBJECTIVE Most recent Vital Signs: Filed Vitals:   02/28/13 0715 02/28/13 0730 02/28/13 0800 02/28/13 0900  BP: 121/92 146/94 131/90 145/101  Pulse: 69 65 63 74  Temp: 98.1 F (36.7 C)     TempSrc: Oral     Resp: 25 12 11 16   Height:      Weight:      SpO2: 93% 100% 96% 97%   CBG (last 3)  No results found for this basename: GLUCAP,  in the last 72 hours  IV Fluid Intake:   . niCARDipine Stopped (02/28/13 0930)    MEDICATIONS  . amLODipine  10 mg Oral Daily  . antiseptic oral rinse  15 mL Mouth Rinse BID  . folic acid  1 mg Oral Daily  . hydrochlorothiazide  25 mg Oral Daily  . levETIRAcetam  500 mg Oral BID  . multivitamin with minerals  1 tablet Oral Daily  . pantoprazole  40 mg Oral QHS  . senna-docusate  1 tablet Oral BID  . thiamine  100 mg Oral Daily   PRN:  acetaminophen, acetaminophen, LORazepam, LORazepam, LORazepam, ondansetron (ZOFRAN) IV  Diet:  Dysphagia 1 honey thick liquids Activity:  Bedrest, OOB DVT Prophylaxis:  SCDs   CLINICALLY SIGNIFICANT STUDIES Basic Metabolic Panel:   Recent Labs Lab 02/26/13 1053 02/27/13 0400  NA 142 139  K 3.5* 3.6*  CL 104 100  CO2 24 25  GLUCOSE 116* 104*  BUN 11 14  CREATININE 0.76 0.82  CALCIUM 9.7 10.0   Liver Function Tests:   Recent Labs Lab 02/23/13 2016 02/28/13 0350  AST 19 10  ALT 12 9  ALKPHOS 77 86   BILITOT 0.3 0.7  PROT 8.1 8.1  ALBUMIN 4.8 4.0   CBC:   Recent Labs Lab 02/26/13 1053 02/27/13 0400  WBC 13.6* 12.2*  NEUTROABS 9.9* 7.9*  HGB 15.5 16.2  HCT 45.4 48.0  MCV 88.3 88.6  PLT 222 247   Coagulation:   Recent Labs Lab 02/23/13 2016  LABPROT 12.5  INR 0.95   Cardiac Enzymes: No results found for this basename: CKTOTAL, CKMB, CKMBINDEX, TROPONINI,  in the last 168 hours Urinalysis:   Recent Labs Lab 02/23/13 2123  COLORURINE YELLOW  LABSPEC 1.016  PHURINE 6.5  GLUCOSEU NEGATIVE  HGBUR NEGATIVE  BILIRUBINUR NEGATIVE  KETONESUR NEGATIVE  PROTEINUR NEGATIVE  UROBILINOGEN 0.2  NITRITE NEGATIVE  LEUKOCYTESUR NEGATIVE   Lipid Panel     Component Value Date/Time   CHOL 219* 02/26/2013 0420   TRIG 63 02/26/2013 0420   HDL 96 02/26/2013 0420   CHOLHDL 2.3 02/26/2013 0420   VLDL 13 02/26/2013 0420   LDLCALC 110* 02/26/2013 0420   HgbA1C  No results found for this basename: HGBA1C    Urine Drug Screen:     Component Value Date/Time   LABOPIA NONE DETECTED 02/23/2013 2123   COCAINSCRNUR  POSITIVE* 02/23/2013 2123   LABBENZ NONE DETECTED 02/23/2013 2123   AMPHETMU NONE DETECTED 02/23/2013 2123   THCU POSITIVE* 02/23/2013 2123   LABBARB NONE DETECTED 02/23/2013 2123    Alcohol Level:   Recent Labs Lab 02/23/13 2016  ETH 31*   CT of the brain   02/25/2013 Stable to slightly improved right basal ganglia intraparenchymal  hematoma. Slight decrease right-to-left shift now approximately 3-4  mm. 02/24/2013 Acute intraparenchymal hemorrhage in the right temporal parietal region appears similar to slightly larger compared to the head CT dated 02/23/2013. On the current head CT, there is now intraventricular hemorrhage, filling approximately 75% of the right lateral ventricle.  Right to left midline shift measures 5 mm (previously 4 mm).  02/23/2013   Acute parenchymal hemorrhage over the right temporal parietal region measuring 4.9 x 3.2 cm in its AP and transverse  dimensions with local mass effect and midline shift to the left of 4 mm.    CT angio head   02/24/2013   1. No intracranial aneurysm identified. 2. Slight interval enlargement of right frontal temporal hematoma now measuring 5.2 x 3.3 cm, previously 4.9 x 3.2 cm. Associated vasogenic edema is slightly increased. 4 mm of right-to-left midline shift is stable. 3. Interval development of intraventricular hemorrhage within the right lateral and 3rd ventricles. No hydrocephalus. 4. Mild stenosis of approximately 30% within the cavernous right ICA. No high-grade flow-limiting stenosis or occlusion identified within the intracranial circulation.    CT angio neck   02/24/2013   1. No high-grade stenosis, dissection, or other abnormality identified within the vasculature of the neck.     MRI of the brain  02/24/2013: No significant change in an intraparenchymal hematoma centered in the right lateral basal ganglia/ external capsule region measuring approximately 5.2 x 3.7 x 3.3 cm. Surrounding vasogenic edema. Right-to-left shift of 6 mm. Intraventricular penetration with blood in the right lateral ventricle. No hydrocephalus.     2D Echocardiogram  Normal LV size and systolic function, EF 60-65%. Normal RV size and systolic function. No significant valvular abnormalities.  Carotid Doppler Findings suggest 1-39% internal carotid artery stenosis bilaterally. Unable to visualize the right vertebral artery, the left vertebral artery is patent with antegrade flow.   CXR    EKG  Normal sinus rhythm. Possible Left atrial enlargement. Incomplete right bundle branch block. Left ventricular hypertrophy. Abnormal ECG.   Therapy Recommendations CIR  Physical Exam   General Examination:  HEENT- Normocephalic, no lesions, without obvious abnormality.  Normal external nose, mucus membranes and septum.  Cardiovascular - S1, S2 normal  Lungs - chest clear, no wheezing, rales, normal symmetric air entry, Heart exam - S1, S2  normal, no murmur, no gallop, rate regular  Abdomen - soft, non-tender; bowel sounds normal; no masses, no organomegaly  Extremities - no edema  Neurologic Examination:  Mental Status:  alert, oriented to name/2015 but not day or month, follows simple commands with Right side, Left neglect. dysarthric Cranial Nerves:  II: decreased blink to threat in the L VF, pupils equal, round, reactive to light III,IV, VI: ptosis not present, right gaze preference with patient unable to go beyond midline to the left.  V,VII: left facial droop, facial light touch sensation decreased on the left  VIII: hearing normal bilaterally  XII: midline tongue extension  Motor:  Right : Upper extremity 5/5 Left: Upper extremity 1-2/5  Lower extremity 5/5 Lower extremity 2/5  Tone and bulk:normal tone throughout; no atrophy noted  Sensory: Pinprick and light  touch decreased on the left  Deep Tendon Reflexes: 2+ and symmetric throughout  Plantars:  Right: downgoing Left: mute  Cerebellar:  Patient not following command Gait: Unable to test     ASSESSMENT Mr. Eric May is a 53 y.o. male presenting with left sided numbness and weakness. 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 Developed seizure in hospital.  On no scheduled antithrombotics prior to admission. Patient with resultant left hemiparesis, dysarthria, dysphagia. Work up completed.   New acute generalized seizure. Treated with ativan and Keppra 500mg  BID. Malignant hypertension, 194/117 on arrival. Not on antihypertensives prior to admission. Weaned off cardene, back on due to elevated BP. Off cardene drip now. On Norvasc 10mg  and HCTZ 25mg .  Cigarette smoker etoh use, 6-pk/day THC positive Cocaine  positive Hyperlipidemia Leukocytosis, improving  Hypokalemia 3.6 on 1/4  Hospital day # 5  TREATMENT/PLAN  CIWA withdrawal protocol, continue thiamine and folic acid supplement  Transfer to the floor  OOB, therapy evals   Rehab consult  SBP goal to < 180/120. Wean cardene gtt. labetalol prn  LDL 110. Start statin prior to discharge  Replace K, check in am  F/u TSH, B12 ordered for possible causes of lethargy  D/w patient and girl friend  Patient counselled to quit cocaine abuse  Annie Main, MSN, RN, ANVP-BC, ANP-BC, [facility] Pager: [phone] 02/28/2013 9:45 AM  I have personally obtained a history, examined the patient, evaluated imaging results, and formulated the assessment and plan of care. I agree with the above.  This patient is critically ill and at significant risk of neurological worsening, death and care requires constant monitoring of vital signs, hemodynamics,respiratory and cardiac monitoring, neurological assessment,  other specialists and medical decision making of high complexity.

## 2013-02-28 NOTE — Consult Note (Signed)
Physical Medicine and Rehabilitation Consult Reason for Consult: Right temporal parietal intracerebral hemorrhage Referring Physician: Dr. Pearlean Brownie   HPI: Eric May is a 53 y.o. right-handed male with history of hypertension. Admitted 02/23/2013 the patient developed left-sided numbness and weakness while at work. Patient reports falling at that time due to weakness on the left. Blood pressure 194/117. CT of the head showed acute parenchymal hemorrhage over the right temporal parietal region measuring 4.9 x 3.2 cm with local mass effect and midline shift of 4 mm. Echocardiogram with ejection fraction 65% no valvular abnormalities. Carotid Dopplers with no ICA stenosis. MRI of the brain 02/24/2013 again showed intraparenchymal hematoma 5.2 x 3.7 x 3.3 cm with surrounding vasogenic edema. Neurosurgery Dr. Conchita Paris consult advise conservative management with repeat serial cranial CT scans. Urine drug screen was positive for cocaine as well as marijuana. Maintained on Cardene drip for blood pressure control. Placed on dysphagia 1 honey thick liquid diet secondary to dysphasia. Ongoing for seizure prophylaxis. Physical therapy evaluation completed 02/26/2013 with recommendations for physical medicine rehabilitation consult to consider inpatient rehabilitation services.  Review of Systems  Unable to perform ROS: mental acuity   Past Medical History  Diagnosis Date  . Hypertension    History reviewed. No pertinent past surgical history. No family history on file. Social History:  reports that he has been smoking.  He does not have any smokeless tobacco history on file. He reports that he drinks alcohol. He reports that he uses illicit drugs (Marijuana). Allergies: No Known Allergies Medications Prior to Admission  Medication Sig Dispense Refill  . meclizine (ANTIVERT) 12.5 MG tablet Take 12.5 mg by mouth 3 (three) times daily as needed for nausea.      . naproxen sodium (ANAPROX) 220 MG tablet  Take 440 mg by mouth 2 (two) times daily as needed (for pain).        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 Functional Status:  Mobility: Bed Mobility Bed Mobility: Supine to Sit;Sit to Supine Rolling Right: 5: Supervision Rolling Left: 5: Supervision Supine to Sit: 1: +2 Total assist Supine to Sit: Patient Percentage: 70% Sit to Supine: 3: Mod assist        ADL: ADL Eating/Feeding: Other (comment) (modified diet) ADL Comments: total A with all ADL at this time  Cognition: Cognition Overall Cognitive Status: Impaired/Different from baseline Arousal/Alertness: Lethargic Orientation Level: Oriented X4 Attention: Focused Focused Attention: Impaired Focused Attention Impairment: Verbal basic;Functional basic Memory: Impaired Memory Impairment: Storage deficit;Decreased short term memory Decreased Short Term Memory: Verbal basic Awareness: Impaired Awareness Impairment: Intellectual impairment;Emergent impairment Problem Solving: Impaired Problem Solving Impairment: Verbal basic;Functional basic Executive Function: Reasoning;Self Monitoring Reasoning: Impaired Reasoning Impairment: Verbal basic;Functional basic Self Monitoring: Impaired Self Monitoring Impairment: Verbal basic;Functional basic Behaviors: Restless;Impulsive;Physical agitation;Verbal agitation;Poor frustration tolerance Safety/Judgment: Impaired Cognition Arousal/Alertness: Lethargic Behavior During Therapy: Agitated;Restless Overall Cognitive  Status: Impaired/Different from baseline Area of Impairment: Orientation;Attention;Following commands;Safety/judgement;Awareness;Problem solving Orientation Level: Disoriented to;Place;Time;Situation Current Attention Level: Focused Following Commands: Follows one step commands inconsistently Safety/Judgement: Decreased awareness of safety;Decreased awareness of deficits Awareness: Intellectual Problem Solving: Slow processing;Decreased initiation;Difficulty sequencing;Requires verbal cues;Requires tactile cues General Comments: will further assess  Blood pressure  145/101, pulse 74, temperature 98.1 F (36.7 C), temperature source Oral, resp. rate 16, height 6' (1.829 m), weight 62.7 kg (138 lb 3.7 oz), SpO2 97.00%. Physical Exam  Vitals reviewed. Constitutional: He appears well-developed.  HENT:  Head: Normocephalic.  Eyes:  Pupils round and reactive to light without nystagmus  Neck: Normal range of motion. Neck supple. No thyromegaly present.  Cardiovascular: Normal rate and regular rhythm.   Respiratory: Effort normal and breath sounds normal. No respiratory distress.  GI: Soft. Bowel sounds are normal. He exhibits no distension.  Neurological:  Patient is lethargic but arousable. Oriented to name and year but not date or month. He did follow simple commands. Did appear to have some trace movements of the left shoulder and perhaps hip---inconsistent. Withdraws to deep pain stimulation. Speech quite dysarthric. distracted  Skin: Skin is warm and dry.  Psychiatric:  Flat, lethargic    Results for orders placed during the hospital encounter of 02/23/13 (from the past 24 hour(s))  HEPATIC FUNCTION PANEL     Status: None   Collection Time    02/28/13  3:50 AM      Result Value Range   Total Protein 8.1  6.0 - 8.3 g/dL   Albumin 4.0  3.5 - 5.2 g/dL   AST 10  0 - 37 U/L   ALT 9  0 - 53 U/L   Alkaline Phosphatase 86  39 - 117 U/L   Total Bilirubin 0.7  0.3 - 1.2 mg/dL   Bilirubin,  Direct <1.6  0.0 - 0.3 mg/dL   Indirect Bilirubin NOT CALCULATED  0.3 - 0.9 mg/dL  AMMONIA     Status: None   Collection Time    02/28/13  3:50 AM      Result Value Range   Ammonia 55  11 - 60 umol/L   No results found.  Assessment/Plan: Diagnosis: right temporal-parietal ICH 1. Does the need for close, 24 hr/day medical supervision in concert with the patient's rehab needs make it unreasonable for this patient to be served in a less intensive setting? Yes 2. Co-Morbidities requiring supervision/potential complications: seizures 3. Due to bladder management, bowel management, safety, skin/wound care, disease management, medication administration, pain management and patient education, does the patient require 24 hr/day rehab nursing? Yes 4. Does the patient require coordinated care of a physician, rehab nurse, PT (1-2 hrs/day, 5 days/week), OT (1-2 hrs/day, 5 days/week) and SLP (1-2 hrs/day, 5 days/week) to address physical and functional deficits in the context of the above medical diagnosis(es)? Yes Addressing deficits in the following areas: balance, endurance, locomotion, strength, transferring, bowel/bladder control, bathing, dressing, feeding, grooming, toileting, cognition, speech, language, swallowing and psychosocial support 5. Can the patient actively participate in an intensive therapy program of at least 3 hrs of therapy per day at least 5 days per week? Yes 6. The potential for patient to make measurable gains while on inpatient rehab is excellent 7. Anticipated functional outcomes upon discharge from inpatient rehab are min assist with PT, min assist with OT, supervision to min assist with SLP. 8. Estimated rehab length of stay to reach the above functional goals is: 20-28 days 9. Does the patient have adequate social supports to accommodate these discharge functional goals? Potentially 10. Anticipated D/C setting: Home 11. Anticipated post D/C treatments: HH therapy 12. Overall  Rehab/Functional Prognosis: good  RECOMMENDATIONS: This patient's condition is appropriate for continued rehabilitative care in the following setting: CIR Patient has agreed to participate in recommended program. Potentially Note that insurance  prior authorization may be required for reimbursement for recommended care.  Comment: Need to follow up on social supports. Spouse?   Ranelle OysterZachary T. Swartz, MD, [facility] CenterFAAPMR [facility]     02/28/2013

## 2013-02-28 NOTE — Progress Notes (Signed)
RN called with reports of leg pain while working with PT as well as chest pain. Pain present with touch. Also noted by Dr. Pearlean BrownieSethi during rounds this am. As pt was found down, and also positive for cocaine, ? Rhabdo, ?myositis, ? Cardiac pain.  Will check CK, troponin and do 12 lead EKG. Ensure hydration.  Annie MainSHARON BIBY, MSN, RN, ANVP-BC, ANP-BC, Lawernce IonGNP-BC [facility] Pager: [phone] 02/28/2013 11:53 AM  I have personally   evaluated results, and formulated the assessment and plan of care. I agree with the above. Delia HeadyPramod Sethi, MD

## 2013-03-01 LAB — BASIC METABOLIC PANEL
BUN: 17 mg/dL (ref 6–23)
CHLORIDE: 99 meq/L (ref 96–112)
CO2: 26 meq/L (ref 19–32)
CREATININE: 0.87 mg/dL (ref 0.50–1.35)
Calcium: 10.1 mg/dL (ref 8.4–10.5)
GFR calc Af Amer: >90 mL/min (ref >90)
GFR calc non Af Amer: >90 mL/min (ref >90)
GLUCOSE: 99 mg/dL (ref 70–99)
Potassium: 3.6 mEq/L — ABNORMAL LOW (ref 3.7–5.3)
Sodium: 141 mEq/L (ref 137–147)

## 2013-03-01 MED ORDER — POTASSIUM CHLORIDE CRYS ER 20 MEQ PO TBCR
20.0000 meq | EXTENDED_RELEASE_TABLET | Freq: Once | ORAL | Status: AC
  Administered 2013-03-01: 20 meq via ORAL

## 2013-03-01 NOTE — Progress Notes (Signed)
Speech Language Pathology Treatment: Dysphagia;Cognitive-Linquistic  Patient Details Name: Eric May MRN: [id number] DOB: 1960-11-12 Today's Date: 03/01/2013 Time: 0810-0825 SLP Time Calculation (min): 15 min  Assessment / Plan / Recommendation Clinical Impression  Pt significantly improved this session, now fully alert and reluctantly participatory. Motivated to drink water. Left sided oral weakness minimal at rest. Pt consumed thin liquids and soft solids without any evidence of aspiration. Swallow timely and strong. There was mild residuals around left dentition that pt needed verbal cues to clear, also cleared with sip of water. Recommend dys 3 diet and thin liquids with full supervision initially. Pt able to sustain attention to task with min verbal cues and verbalized awareness of deficits with min verbal cues. Pt making progress.    HPI HPI: 53 y.o. male presenting with left sided numbness and weakness. Imaging confirms a right temporoparietal intracerebral hemorrhage with mass effect and 44 mm midline shift; cytotoxic cerebral edema. 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   Pertinent Vitals NA  SLP Plan  Continue with current plan of care    Recommendations Diet recommendations: Dysphagia 3 (mechanical soft);Thin liquid Liquids provided via: Cup;Straw Medication Administration: Whole meds with liquid Supervision: Staff to assist with self feeding Compensations: Slow rate;Small sips/bites;Check for pocketing;Check for anterior loss Postural Changes and/or Swallow Maneuvers: Seated upright 90 degrees              General recommendations: Rehab consult Oral Care Recommendations: Oral care BID Follow up Recommendations: Inpatient Rehab Plan: Continue with current plan of care    GO    [facility]. Francis HospitalBonnie Raekwan Spelman, MA CCC-SLP [phone]  Claudine MoutonDeBlois, Alexys Lobello Caroline 03/01/2013, 9:27 AM

## 2013-03-01 NOTE — H&P (Signed)
Physical Medicine and Rehabilitation Admission H&P    Chief Complaint  Patient presents with  . Code Stroke  :  Chief complaint: Headache  HPI: Kaya Mahn is a 53 y.o. right-handed male with history of hypertension, tobacco and alcohol abuse. Admitted 02/23/2013 when patient developed left-sided numbness and weakness while at work. Patient reports falling at that time due to weakness on the left. Blood pressure 194/117. CT of the head showed acute parenchymal hemorrhage over the right temporal parietal region measuring 4.9 x 3.2 cm with local mass effect and midline shift of 4 mm. Echocardiogram with ejection fraction 65% no valvular abnormalities. Carotid Dopplers with no ICA stenosis. MRI of the brain 02/24/2013 again showed intraparenchymal hematoma 5.2 x 3.7 x 3.3 cm with surrounding vasogenic edema. Neurosurgery Dr. Nundkumar consult advise conservative management with repeat serial cranial CT scans. Urine drug screen was positive for cocaine as well as marijuana. Maintained on Cardene drip for blood pressure control.  Diet advance to dysphagia 3 thin liquids 03/01/2013. Keppra for seizure prophylaxis. Physical therapy evaluation completed 02/26/2013 with recommendations for physical medicine rehabilitation consult to consider inpatient rehabilitation services. Patient was felt to be a good candidate for inpatient rehabilitation services and was admitted for comprehensive rehabilitation program   Review of Systems  Musculoskeletal: Positive for myalgias.  Neurological: Positive for headaches.  All other systems reviewed and are negative.   Past Medical History  Diagnosis Date  . Hypertension    History reviewed. No pertinent past surgical history. No family history on file. Social History:  reports that he has been smoking.  He does not have any smokeless tobacco history on file. He reports that he drinks alcohol. He reports that he uses illicit drugs (Marijuana). Allergies: No  Known Allergies Medications Prior to Admission  Medication Sig Dispense Refill  . meclizine (ANTIVERT) 12.5 MG tablet Take 12.5 mg by mouth 3 (three) times daily as needed for nausea.      . naproxen sodium (ANAPROX) 220 MG tablet Take 440 mg by mouth 2 (two) times daily as needed (for pain).        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  Functional Status:  Mobility: Bed Mobility Bed Mobility: Supine to Sit;Sitting - Scoot to Edge of Bed Rolling Right: 5: Supervision Rolling Left: 5: Supervision Supine to Sit: 1: +2 Total assist Supine to Sit: Patient Percentage: 50% Sitting - Scoot to Edge of Bed: 1: +2 Total assist Sitting - Scoot to Edge of Bed: Patient Percentage: 50% Sit to Supine: 3: Mod assist Transfers Transfers: Sit to Stand;Stand to Sit;Stand Pivot Transfers Sit to Stand: 1: +2 Total assist Sit to Stand: Patient Percentage: 50% Stand to Sit: 1: +2 Total assist Stand to Sit: Patient Percentage: 50% Stand Pivot Transfers: 1: +2 Total assist Stand Pivot Transfers: Patient Percentage: 50% Ambulation/Gait Ambulation/Gait Assistance: Not tested (comment)    ADL: ADL Eating/Feeding: Other (comment) (modified diet) ADL Comments: total A with all ADL at this time  Cognition: Cognition Overall Cognitive Status: Impaired/Different from baseline Arousal/Alertness: Lethargic Orientation Level: Oriented X4 Attention: Focused Focused Attention: Impaired Focused Attention Impairment: Verbal basic;Functional  basic Memory: Impaired Memory Impairment:   Storage deficit;Decreased short term memory Decreased Short Term Memory: Verbal basic Awareness: Impaired Awareness Impairment: Intellectual impairment;Emergent impairment Problem Solving: Impaired Problem Solving Impairment: Verbal basic;Functional basic Executive Function: Reasoning;Self Monitoring Reasoning: Impaired Reasoning Impairment: Verbal basic;Functional basic Self Monitoring: Impaired Self Monitoring Impairment: Verbal basic;Functional basic Behaviors: Restless;Impulsive;Physical agitation;Verbal agitation;Poor frustration tolerance Safety/Judgment: Impaired Cognition Arousal/Alertness: Lethargic Behavior During Therapy: Restless;Agitated Overall Cognitive Status: Impaired/Different from baseline Area of Impairment: Attention;Following commands;Safety/judgement;Awareness;Problem solving Orientation Level:  (pt oriented this date) Current Attention Level: Focused Memory: Decreased short-term memory Following Commands: Follows one step commands with increased time Safety/Judgement: Decreased awareness of safety;Decreased awareness of deficits Awareness: Intellectual Problem Solving: Slow processing;Decreased initiation;Requires verbal cues;Difficulty sequencing;Requires tactile cues General Comments: pt easily frustrated  Physical Exam: Blood pressure 152/102, pulse 60, temperature 98.1 F (36.7 C), temperature source Axillary, resp. rate 13, height 6' (1.829 m), weight 62.7 kg (138 lb 3.7 oz), SpO2 96.00%. Physical Exam Physical Exam  Vitals reviewed.  Constitutional: He appears lethargic, no distress. Sleeping upon my entry HENT: poor dentition, thrush over tongue Head: Normocephalic.  Eyes:  Pupils round and reactive to light without nystagmus  Neck: Normal range of motion. Neck supple. No thyromegaly present.  Cardiovascular: Normal rate and regular rhythm. No murmurs Respiratory: Effort normal and breath sounds normal.  No respiratory distress. No wheezes, rales, rhonchi GI: Soft. Bowel sounds are normal. He exhibits no distension.  Neurological:  Patient is lethargic but arousable. Oriented to name and year but not date or month. He did follow some simple commands. Generally answered "yeah" to most of my questions. Did appear to have some trace movements of the left shoulder but otherwise was 0/5. LLE was 1-2/5 with HF and KE and trace at the ankle.  Withdraws to deep pain stimulation in both the arm and the left leg. Speech quite dysarthric.    Skin: Skin is warm and dry.  Psychiatric:  Flat, lethargic   Results for orders placed during the hospital encounter of 02/23/13 (from the past 48 hour(s))  HEPATIC FUNCTION PANEL     Status: None   Collection Time    02/28/13  3:50 AM      Result Value Range   Total Protein 8.1  6.0 - 8.3 g/dL   Albumin 4.0  3.5 - 5.2 g/dL   AST 10  0 - 37 U/L   ALT 9  0 - 53 U/L   Alkaline Phosphatase 86  39 - 117 U/L   Total Bilirubin 0.7  0.3 - 1.2 mg/dL   Bilirubin, Direct <0.2  0.0 - 0.3 mg/dL   Indirect Bilirubin NOT CALCULATED  0.3 - 0.9 mg/dL  AMMONIA     Status: None   Collection Time    02/28/13  3:50 AM      Result Value Range   Ammonia 55  11 - 60 umol/L  TSH     Status: None   Collection Time    02/28/13  3:50 AM      Result Value Range   TSH 3.299  0.350 - 4.500 uIU/mL   Comment: Performed at [facility]  VITAMIN B12     Status: None   Collection Time    02/28/13  3:50 AM      Result Value Range   Vitamin B-12 555  211 - 911 pg/mL   Comment: Performed at [facility]  CK TOTAL AND CKMB     Status: None   Collection Time    02/28/13 12:40 PM        Result Value Range   Total CK 36  7 - 232 U/L   CK, MB 0.7  0.3 - 4.0 ng/mL   Relative Index RELATIVE INDEX IS INVALID  0.0 - 2.5   Comment: WHEN CK < 100 U/L             TROPONIN I     Status: None   Collection Time    02/28/13 12:40 PM      Result Value Range   Troponin I <0.30   <0.30 ng/mL   Comment:            Due to the release kinetics of cTnI,     a negative result within the first hours     of the onset of symptoms does not rule out     myocardial infarction with certainty.     If myocardial infarction is still suspected,     repeat the test at appropriate intervals.  BASIC METABOLIC PANEL     Status: Abnormal   Collection Time    03/01/13  3:45 AM      Result Value Range   Sodium 141  137 - 147 mEq/L   Potassium 3.6 (*) 3.7 - 5.3 mEq/L   Chloride 99  96 - 112 mEq/L   CO2 26  19 - 32 mEq/L   Glucose, Bld 99  70 - 99 mg/dL   BUN 17  6 - 23 mg/dL   Creatinine, Ser 0.87  0.50 - 1.35 mg/dL   Calcium 10.1  8.4 - 10.5 mg/dL   GFR calc non Af Amer >90  >90 mL/min   GFR calc Af Amer >90  >90 mL/min   Comment: (NOTE)     The eGFR has been calculated using the CKD EPI equation.     This calculation has not been validated in all clinical situations.     eGFR's persistently <90 mL/min signify possible Chronic Kidney     Disease.   No results found.  Post Admission Physician Evaluation: 1. Functional deficits secondary  to right temporo-parietal ICH. 2. Patient is admitted to receive collaborative, interdisciplinary care between the physiatrist, rehab nursing staff, and therapy team. 3. Patient's level of medical complexity and substantial therapy needs in context of that medical necessity cannot be provided at a lesser intensity of care such as a SNF. 4. Patient has experienced substantial functional loss from his/her baseline which was documented above under the "Functional History" and "Functional Status" headings.  Judging by the patient's diagnosis, physical exam, and functional history, the patient has potential for functional progress which will result in measurable gains while on inpatient rehab.  These gains will be of substantial and practical use upon discharge  in facilitating mobility and self-care at the household level. 5. Physiatrist will provide  24 hour management of medical needs as well as oversight of the therapy plan/treatment and provide guidance as appropriate regarding the interaction of the two. 6. 24 hour rehab nursing will assist with bladder management, bowel management, safety, skin/wound care, disease management, medication administration, pain management and patient education  and help integrate therapy concepts, techniques,education, etc. 7. PT will assess and treat for/with: Lower extremity strength, range of motion, stamina, balance, functional mobility, safety, adaptive techniques and equipment, NMR, family education.   Goals are: min assist. 8. OT will assess and treat for/with: ADL's, functional mobility, safety, upper extremity strength, adaptive techniques and equipment, NMR, cognitive perceptual rx, family ed.   Goals are: min to mod assist. 9.   SLP will assess and treat for/with: speech, swallowing, cognition, education.  Goals are: supervision to min assist. 10. Case Management and Social Worker will assess and treat for psychological issues and discharge planning. 11. Team conference will be held weekly to assess progress toward goals and to determine barriers to discharge. 12. Patient will receive at least 3 hours of therapy per day at least 5 days per week. 13. ELOS: 10-20 days (at the lower end of the range if this is a reduce burden of care situation or if he doesn't participate in therapies.)       14. Prognosis:  good   Medical Problem List and Plan: 1. 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 DVT Prophylaxis/Anticoagulation: SCDs. Monitor for any signs of DVT 3. Pain Management: Tylenol as needed 4. Neuropsych: This patient is not capable of making decisions on his own behalf. 5. Seizure prophylaxis. Keppra 500 mg twice a day. Monitor for any seizure activity 6. Hypertension. Norvasc 10 mg daily, hydrochlorothiazide 25 mg daily. Monitor with increased mobility 7.  Dysphasia.[name] dysphagia 3 thin liquids. Followup speech therapy 8. History of tobacco alcohol abuse. Urine drug screen positive cocaine and marijuana. Provide counseling as appropriate 9. Thrush: 3 days of diflucan  Meredith Staggers, MD, [place] [facility]  03/01/2013

## 2013-03-01 NOTE — Progress Notes (Signed)
Physical Therapy Treatment Patient Details Name: Eric May MRN: [id number] DOB: 10-20-1960 Today's Date: 03/01/2013 Time: [id number] PT Time Calculation (min): 24 min  PT Assessment / Plan / Recommendation  History of Present Illness 53 yo with ICH - R BG/ext capsule   PT Comments   Pt able to tolerate ambulation this this date. Pt con't to have L sided neglect, impaired sensation and L sided weakness. Pt however demo'd ability to complete L quad set and bring L knee to chest in bed. Pt con't to be appropriate for CIR upon d/c for maximal functional recovery for safe transition home.   Follow Up Recommendations  CIR     Does the patient have the potential to tolerate intense rehabilitation     Barriers to Discharge        Equipment Recommendations       Recommendations for Other Services Rehab consult  Frequency Min 4X/week   Progress towards PT Goals Progress towards PT goals: Progressing toward goals  Plan Frequency needs to be updated    Precautions / Restrictions Precautions Precautions: Fall Precaution Comments: pt con't to be agitated at onset of pain Restrictions Weight Bearing Restrictions: No   Pertinent Vitals/Pain Pt con't to have L flank and LE pain with mobility     Mobility  Bed Mobility Bed Mobility: Supine to Sit;Sitting - Scoot to Edge of Bed Supine to Sit: 1: +2 Total assist Supine to Sit: Patient Percentage: 50% Sitting - Scoot to Edge of Bed: 1: +2 Total assist Sitting - Scoot to Edge of Bed: Patient Percentage: 60% Details for Bed Mobility Assistance: max directional step by step cues to stay on task, pt with more L LE mvmt and increased participation in transfer. Transfers Transfers: Sit to Stand;Stand to Sit Sit to Stand: 1: +2 Total assist Sit to Stand: Patient Percentage: 60% Stand to Sit: 1: +2 Total assist Stand to Sit: Patient Percentage: 60% Details for Transfer Assistance: pt able to maintain L knee in extension in static standing.  pt con't to require tactile cues to achieve bilat hip ext and trunk ext Ambulation/Gait Ambulation/Gait Assistance: 1: +2 Total assist Ambulation/Gait: Patient Percentage: 40% Ambulation Distance (Feet): 5 Feet Assistive device: 2 person hand held assist Ambulation/Gait Assistance Details: maxA to advance L LE, pt able to initiate but unable to clear foot. Pt requires blocking of left knee during stande phase to prevent buckling. Gait Pattern: Step-to pattern;Decreased step length - left;Decreased stance time - left;Left steppage;Left flexed knee in stance;Narrow base of support Gait velocity: slow General Gait Details: pt with onset of flank pain during ambulation limiting duration Modified Rankin (Stroke Patients Only) Pre-Morbid Rankin Score: No symptoms Modified Rankin: Severe disability    Exercises     PT Diagnosis:    PT Problem List:   PT Treatment Interventions:     PT Goals (current goals can now be found in the care plan section)    Visit Information  Last PT Received On: 03/01/13 Assistance Needed: +2 History of Present Illness: 53 yo with ICH - R BG/ext capsule    Subjective Data      Cognition  Cognition Arousal/Alertness: Lethargic Behavior During Therapy: Restless Overall Cognitive Status: Impaired/Different from baseline Area of Impairment: Following commands;Safety/judgement;Awareness;Problem solving Orientation Level:  (pt con't to be oriented) Current Attention Level: Focused Memory: Decreased short-term memory Following Commands: Follows one step commands with increased time Safety/Judgement: Decreased awareness of safety;Decreased awareness of deficits Awareness: Intellectual Problem Solving: Slow processing;Decreased initiation;Requires verbal cues;Difficulty sequencing;Requires tactile  cues General Comments: pt easily distracted, easily agitated at onset of pain    Balance  Balance Balance Assessed: Yes Static Sitting Balance Static Sitting -  Balance Support: Right upper extremity supported Static Sitting - Level of Assistance: 4: Min assist Static Sitting - Comment/# of Minutes: pt con't to require UE assist to maitain sitting balance Static Standing Balance Static Standing - Balance Support: Bilateral upper extremity supported Static Standing - Level of Assistance: 1: +2 Total assist Static Standing - Comment/# of Minutes: max verbal and tactile cues to achieve upright posture  End of Session PT - End of Session Equipment Utilized During Treatment: Gait belt Activity Tolerance: Patient limited by fatigue;Treatment limited secondary to agitation Patient left: in chair;with call bell/phone within reach;with family/visitor present Nurse Communication: Mobility status   GP     Rhone Ozaki Marie 03/01/2013, 1:40 PM  Lewis Shock, PT, DPT Pager #: [phone] Office #: [phone]

## 2013-03-01 NOTE — Progress Notes (Signed)
I met with pt at bedside and then contacted his Mom and girlfriend, Arrie Aran, by phone. Discussed inpt rehab venue vs SNF depending on his caregiver support available at d/c or decreasing burden of care before SNF placement. [name] would like to meet me , pt's Mom and pt tomorrow at 9 am to clarify rehab expectations of pt as well as discuss goals of rehab. Girlfriend states it will depend on pt's willingness to put forth the effort to rehab based on his discussions with her since the CVA. I will follow up in the morning and will not plan admit to inpt rehab today. I will discuss with Dr. Naaman Plummer. [phone]

## 2013-03-01 NOTE — Progress Notes (Signed)
Stroke Team Progress Note  HISTORY Eric May is an 53 y.o. male who was at work today 02/23/2013 and found down by co-workers with left sided weakness. The patient reports that he was doing well until he went to the bathroom at about 1845. He reports falling at that time due to weakness on the left. EMS was called and the patient was brought in to [facility] for evaluation.  Patient was not a TPA candidate secondary to ICH. He was admitted to the neuro ICU for further evaluation and treatment.  SUBJECTIVE Pt in bed. No family at bedside.  OBJECTIVE Most recent Vital Signs: Filed Vitals:   03/01/13 0500 03/01/13 0600 03/01/13 0700 03/01/13 0800  BP: 139/91 147/100 152/102 152/99  Pulse: 58 59 60 76  Temp:   98.9 F (37.2 C)   TempSrc:   Oral   Resp: 11 13 13 19   Height:      Weight:      SpO2: 99% 99% 96% 99%   CBG (last 3)  No results found for this basename: GLUCAP,  in the last 72 hours  IV Fluid Intake:   . sodium chloride 50 mL/hr at 03/01/13 0800  . niCARDipine Stopped (02/28/13 0930)    MEDICATIONS  . amLODipine  10 mg Oral Daily  . antiseptic oral rinse  15 mL Mouth Rinse BID  . folic acid  1 mg Oral Daily  . hydrochlorothiazide  25 mg Oral Daily  . levETIRAcetam  500 mg Oral BID  . multivitamin with minerals  1 tablet Oral Daily  . pantoprazole  40 mg Oral QHS  . potassium chloride  20 mEq Oral Daily  . senna-docusate  1 tablet Oral BID  . thiamine  100 mg Oral Daily   PRN:  acetaminophen, acetaminophen, labetalol, LORazepam, ondansetron (ZOFRAN) IV  Diet:  Dysphagia 1 honey thick liquids Activity:  OOB DVT Prophylaxis:  SCDs   CLINICALLY SIGNIFICANT STUDIES Basic Metabolic Panel:   Recent Labs Lab 02/27/13 0400 03/01/13 0345  NA 139 141  K 3.6* 3.6*  CL 100 99  CO2 25 26  GLUCOSE 104* 99  BUN 14 17  CREATININE 0.82 0.87  CALCIUM 10.0 10.1   Liver Function Tests:   Recent Labs Lab 02/23/13 2016 02/28/13 0350  AST 19 10  ALT 12 9  ALKPHOS  77 86  BILITOT 0.3 0.7  PROT 8.1 8.1  ALBUMIN 4.8 4.0   CBC:   Recent Labs Lab 02/26/13 1053 02/27/13 0400  WBC 13.6* 12.2*  NEUTROABS 9.9* 7.9*  HGB 15.5 16.2  HCT 45.4 48.0  MCV 88.3 88.6  PLT 222 247   Coagulation:   Recent Labs Lab 02/23/13 2016  LABPROT 12.5  INR 0.95   Cardiac Enzymes:   Recent Labs Lab 02/28/13 1240  CKTOTAL 36  CKMB 0.7  TROPONINI <0.30   Urinalysis:   Recent Labs Lab 02/23/13 2123  COLORURINE YELLOW  LABSPEC 1.016  PHURINE 6.5  GLUCOSEU NEGATIVE  HGBUR NEGATIVE  BILIRUBINUR NEGATIVE  KETONESUR NEGATIVE  PROTEINUR NEGATIVE  UROBILINOGEN 0.2  NITRITE NEGATIVE  LEUKOCYTESUR NEGATIVE   Lipid Panel     Component Value Date/Time   CHOL 219* 02/26/2013 0420   TRIG 63 02/26/2013 0420   HDL 96 02/26/2013 0420   CHOLHDL 2.3 02/26/2013 0420   VLDL 13 02/26/2013 0420   LDLCALC 110* 02/26/2013 0420   HgbA1C  No results found for this basename: HGBA1C    Urine Drug Screen:     Component Value Date/Time  LABOPIA NONE DETECTED 02/23/2013 2123   COCAINSCRNUR POSITIVE* 02/23/2013 2123   LABBENZ NONE DETECTED 02/23/2013 2123   AMPHETMU NONE DETECTED 02/23/2013 2123   THCU POSITIVE* 02/23/2013 2123   LABBARB NONE DETECTED 02/23/2013 2123    Alcohol Level:   Recent Labs Lab 02/23/13 2016  ETH 31*   CT of the brain   02/25/2013 Stable to slightly improved right basal ganglia intraparenchymal  hematoma. Slight decrease right-to-left shift now approximately 3-4  mm. 02/24/2013 Acute intraparenchymal hemorrhage in the right temporal parietal region appears similar to slightly larger compared to the head CT dated 02/23/2013. On the current head CT, there is now intraventricular hemorrhage, filling approximately 75% of the right lateral ventricle.  Right to left midline shift measures 5 mm (previously 4 mm).  02/23/2013   Acute parenchymal hemorrhage over the right temporal parietal region measuring 4.9 x 3.2 cm in its AP and transverse  dimensions with local mass effect and midline shift to the left of 4 mm.    CT angio head   02/24/2013   1. No intracranial aneurysm identified. 2. Slight interval enlargement of right frontal temporal hematoma now measuring 5.2 x 3.3 cm, previously 4.9 x 3.2 cm. Associated vasogenic edema is slightly increased. 4 mm of right-to-left midline shift is stable. 3. Interval development of intraventricular hemorrhage within the right lateral and 3rd ventricles. No hydrocephalus. 4. Mild stenosis of approximately 30% within the cavernous right ICA. No high-grade flow-limiting stenosis or occlusion identified within the intracranial circulation.    CT angio neck   02/24/2013   1. No high-grade stenosis, dissection, or other abnormality identified within the vasculature of the neck.     MRI of the brain  02/24/2013: No significant change in an intraparenchymal hematoma centered in the right lateral basal ganglia/ external capsule region measuring approximately 5.2 x 3.7 x 3.3 cm. Surrounding vasogenic edema. Right-to-left shift of 6 mm. Intraventricular penetration with blood in the right lateral ventricle. No hydrocephalus.     2D Echocardiogram  Normal LV size and systolic function, EF 60-65%. Normal RV size and systolic function. No significant valvular abnormalities.  Carotid Doppler Findings suggest 1-39% internal carotid artery stenosis bilaterally. Unable to visualize the right vertebral artery, the left vertebral artery is patent with antegrade flow.   CXR    EKG  Normal sinus rhythm. Possible Left atrial enlargement. Incomplete right bundle branch block. Left ventricular hypertrophy. Abnormal ECG.   Therapy Recommendations CIR  Physical Exam   General Examination:  HEENT- Normocephalic, no lesions, without obvious abnormality.  Normal external nose, mucus membranes and septum.  Cardiovascular - S1, S2 normal  Lungs - chest clear, no wheezing, rales, normal symmetric air entry, Heart exam - S1, S2  normal, no murmur, no gallop, rate regular  Abdomen - soft, non-tender; bowel sounds normal; no masses, no organomegaly  Extremities - no edema  Neurologic Examination:  Mental Status:  alert, oriented to name/2015 but not day or month, follows simple commands with Right side, Left neglect. dysarthric Cranial Nerves:  II: decreased blink to threat in the L VF, pupils equal, round, reactive to light III,IV, VI: ptosis not present, right gaze preference with patient unable to go beyond midline to the left.  V,VII: left facial droop, facial light touch sensation decreased on the left  VIII: hearing normal bilaterally  XII: midline tongue extension  Motor:  Right : Upper extremity 5/5 Left: Upper extremity 1-2/5  Lower extremity 5/5 Lower extremity 2/5  Tone and bulk:normal tone throughout;  no atrophy noted  Sensory: Pinprick and light touch decreased on the left  Deep Tendon Reflexes: 2+ and symmetric throughout  Plantars:  Right: downgoing Left: mute  Cerebellar:  Patient not following command Gait: Unable to test     ASSESSMENT Mr. Eric May is a 53 y.o. male presenting with left sided numbness and weakness. 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 Developed seizure in hospital.  On no scheduled antithrombotics prior to admission. Patient with resultant left hemiparesis, dysarthria, dysphagia. Work up completed.   New acute generalized seizure. Treated with ativan and Keppra 500mg  BID. Malignant hypertension, 194/117 on arrival. Not on antihypertensives prior to admission. Weaned off cardene, back on due to elevated BP. Off cardene drip now. On Norvasc 10mg  and HCTZ 25mg .  Cigarette smoker etoh use, 6-pk/day THC positive Cocaine  positive Hyperlipidemia Leukocytosis, improving  Hypokalemia 3.6 on 1/4 Leg and Chest pain, cardiac enzymes negative  Hospital day # 6  TREATMENT/PLAN  CIWA withdrawal protocol, continue thiamine and folic acid supplement  Transfer to the floor - await bed  Continue SBP goal to < 180/120. labetalol prn  Start statin prior to discharge  Additional K 20 meq today (total 40), then continue 20 a day, check in 2 days  quit cocaine abuse  Rehab eval for CIR transfer  Annie Main, MSN, RN, ANVP-BC, ANP-BC, GNP-BC [facility] Pager: [phone] 03/01/2013 9:11 AM  I have personally obtained a history, examined the patient, evaluated imaging results, and formulated the assessment and plan of care. I agree with the above.  Delia Heady, MD

## 2013-03-02 ENCOUNTER — Inpatient Hospital Stay (HOSPITAL_COMMUNITY)
Admission: RE | Admit: 2013-03-02 | Discharge: 2013-04-14 | DRG: 945 | Disposition: A | Discharge status: Home Health | Payer: Medicaid Other | Source: Intra-hospital | Attending: Physical Medicine & Rehabilitation | Admitting: Physical Medicine & Rehabilitation

## 2013-03-02 DIAGNOSIS — I619 Nontraumatic intracerebral hemorrhage, unspecified: Secondary | ICD-10-CM

## 2013-03-02 DIAGNOSIS — G890 Central pain syndrome: Secondary | ICD-10-CM | POA: Diagnosis not present

## 2013-03-02 DIAGNOSIS — G936 Cerebral edema: Secondary | ICD-10-CM | POA: Diagnosis present

## 2013-03-02 DIAGNOSIS — R471 Dysarthria and anarthria: Secondary | ICD-10-CM | POA: Diagnosis present

## 2013-03-02 DIAGNOSIS — R4587 Impulsiveness: Secondary | ICD-10-CM | POA: Diagnosis present

## 2013-03-02 DIAGNOSIS — H6120 Impacted cerumen, unspecified ear: Secondary | ICD-10-CM | POA: Diagnosis not present

## 2013-03-02 DIAGNOSIS — K5641 Fecal impaction: Secondary | ICD-10-CM | POA: Diagnosis not present

## 2013-03-02 DIAGNOSIS — IMO0002 Reserved for concepts with insufficient information to code with codable children: Secondary | ICD-10-CM | POA: Diagnosis present

## 2013-03-02 DIAGNOSIS — F919 Conduct disorder, unspecified: Secondary | ICD-10-CM | POA: Diagnosis present

## 2013-03-02 DIAGNOSIS — R569 Unspecified convulsions: Secondary | ICD-10-CM

## 2013-03-02 DIAGNOSIS — R1013 Epigastric pain: Secondary | ICD-10-CM

## 2013-03-02 DIAGNOSIS — M25569 Pain in unspecified knee: Secondary | ICD-10-CM | POA: Diagnosis present

## 2013-03-02 DIAGNOSIS — R1310 Dysphagia, unspecified: Secondary | ICD-10-CM | POA: Diagnosis present

## 2013-03-02 DIAGNOSIS — F17200 Nicotine dependence, unspecified, uncomplicated: Secondary | ICD-10-CM | POA: Diagnosis present

## 2013-03-02 DIAGNOSIS — R209 Unspecified disturbances of skin sensation: Secondary | ICD-10-CM | POA: Diagnosis present

## 2013-03-02 DIAGNOSIS — K3189 Other diseases of stomach and duodenum: Secondary | ICD-10-CM | POA: Diagnosis not present

## 2013-03-02 DIAGNOSIS — F1410 Cocaine abuse, uncomplicated: Secondary | ICD-10-CM | POA: Diagnosis present

## 2013-03-02 DIAGNOSIS — Z5189 Encounter for other specified aftercare: Principal | ICD-10-CM

## 2013-03-02 DIAGNOSIS — Z79899 Other long term (current) drug therapy: Secondary | ICD-10-CM

## 2013-03-02 DIAGNOSIS — I10 Essential (primary) hypertension: Secondary | ICD-10-CM | POA: Diagnosis present

## 2013-03-02 DIAGNOSIS — B370 Candidal stomatitis: Secondary | ICD-10-CM | POA: Diagnosis present

## 2013-03-02 DIAGNOSIS — G8190 Hemiplegia, unspecified affecting unspecified side: Secondary | ICD-10-CM | POA: Diagnosis present

## 2013-03-02 DIAGNOSIS — F1210 Cannabis abuse, uncomplicated: Secondary | ICD-10-CM | POA: Diagnosis present

## 2013-03-02 DIAGNOSIS — F1010 Alcohol abuse, uncomplicated: Secondary | ICD-10-CM | POA: Diagnosis present

## 2013-03-02 MED ORDER — HYDROCHLOROTHIAZIDE 25 MG PO TABS
25.0000 mg | ORAL_TABLET | Freq: Every day | ORAL | Status: DC
  Administered 2013-03-03 – 2013-03-13 (×11): 25 mg via ORAL
  Filled 2013-03-02 (×12): qty 1

## 2013-03-02 MED ORDER — VITAMIN B-1 100 MG PO TABS
100.0000 mg | ORAL_TABLET | Freq: Every day | ORAL | Status: DC
Start: 2013-03-03 — End: 2013-04-14
  Administered 2013-03-03 – 2013-04-14 (×43): 100 mg via ORAL
  Filled 2013-03-02 (×44): qty 1

## 2013-03-02 MED ORDER — SORBITOL 70 % SOLN
30.0000 mL | Freq: Every day | Status: DC | PRN
  Administered 2013-03-07 – 2013-03-13 (×3): 30 mL via ORAL
  Filled 2013-03-02 (×3): qty 30

## 2013-03-02 MED ORDER — LEVETIRACETAM 500 MG PO TABS
500.0000 mg | ORAL_TABLET | Freq: Two times a day (BID) | ORAL | Status: DC
  Administered 2013-03-02 – 2013-04-14 (×84): 500 mg via ORAL
  Filled 2013-03-02 (×88): qty 1

## 2013-03-02 MED ORDER — ONDANSETRON HCL 4 MG/2ML IJ SOLN: 4.0000 mg | Freq: Four times a day (QID) | INTRAMUSCULAR | Status: DC | PRN

## 2013-03-02 MED ORDER — FOLIC ACID 1 MG PO TABS
1.0000 mg | ORAL_TABLET | Freq: Every day | ORAL | Status: DC
  Administered 2013-03-03 – 2013-04-14 (×43): 1 mg via ORAL
  Filled 2013-03-02 (×44): qty 1

## 2013-03-02 MED ORDER — BIOTENE DRY MOUTH MT LIQD
15.0000 mL | Freq: Two times a day (BID) | OROMUCOSAL | Status: DC
  Administered 2013-03-02 – 2013-04-14 (×79): 15 mL via OROMUCOSAL

## 2013-03-02 MED ORDER — ACETAMINOPHEN 325 MG PO TABS
650.0000 mg | ORAL_TABLET | ORAL | Status: DC | PRN
  Administered 2013-03-03 – 2013-04-12 (×28): 650 mg via ORAL
  Filled 2013-03-02 (×29): qty 2

## 2013-03-02 MED ORDER — SENNOSIDES-DOCUSATE SODIUM 8.6-50 MG PO TABS
1.0000 | ORAL_TABLET | Freq: Two times a day (BID) | ORAL | Status: DC
  Administered 2013-03-02 – 2013-03-07 (×10): 1 via ORAL
  Filled 2013-03-02 (×10): qty 1

## 2013-03-02 MED ORDER — ACETAMINOPHEN 650 MG RE SUPP: 650.0000 mg | RECTAL | Status: DC | PRN

## 2013-03-02 MED ORDER — AMLODIPINE BESYLATE 10 MG PO TABS
10.0000 mg | ORAL_TABLET | Freq: Every day | ORAL | Status: DC
  Administered 2013-03-03 – 2013-04-14 (×43): 10 mg via ORAL
  Filled 2013-03-02 (×45): qty 1

## 2013-03-02 MED ORDER — ONDANSETRON HCL 4 MG PO TABS
4.0000 mg | ORAL_TABLET | Freq: Four times a day (QID) | ORAL | Status: DC | PRN
  Administered 2013-03-22: 4 mg via ORAL
  Filled 2013-03-02: qty 1

## 2013-03-02 MED ORDER — POTASSIUM CHLORIDE CRYS ER 20 MEQ PO TBCR
20.0000 meq | EXTENDED_RELEASE_TABLET | Freq: Every day | ORAL | Status: DC
  Administered 2013-03-04 – 2013-04-14 (×42): 20 meq via ORAL
  Filled 2013-03-02 (×44): qty 1

## 2013-03-02 MED ORDER — ADULT MULTIVITAMIN W/MINERALS CH
1.0000 | ORAL_TABLET | Freq: Every day | ORAL | Status: DC
  Administered 2013-03-03 – 2013-04-14 (×43): 1 via ORAL
  Filled 2013-03-02 (×44): qty 1

## 2013-03-02 NOTE — Discharge Summary (Signed)
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    Past Medical History  Diagnosis Date  . Hypertension    History reviewed. No pertinent past surgical history.  Medications to be continued on Rehab . amLODipine  10 mg Oral Daily  . antiseptic oral rinse  15 mL Mouth Rinse BID  . folic acid  1 mg Oral Daily  . hydrochlorothiazide  25 mg Oral Daily  . levETIRAcetam  500 mg Oral BID  . multivitamin with minerals  1 tablet Oral Daily  . potassium chloride  20 mEq Oral Daily  . senna-docusate  1 tablet Oral BID  . thiamine  100 mg Oral Daily    LABORATORY STUDIES CBC    Component Value Date/Time   WBC 12.2* 02/27/2013 0400   RBC 5.42 02/27/2013 0400   HGB 16.2 02/27/2013 0400   HCT 48.0 02/27/2013 0400   PLT 247 02/27/2013 0400   MCV 88.6 02/27/2013 0400   MCH 29.9 02/27/2013 0400   MCHC 33.8 02/27/2013 0400   RDW 14.0 02/27/2013 0400   LYMPHSABS 2.7 02/27/2013 0400   MONOABS 1.6* 02/27/2013 0400   EOSABS 0.0 02/27/2013 0400   BASOSABS 0.0 02/27/2013 0400   CMP    Component Value Date/Time   NA 141 03/01/2013 0345   K 3.6* 03/01/2013 0345   CL 99 03/01/2013 0345   CO2 26 03/01/2013 0345   GLUCOSE 99  03/01/2013 0345   BUN 17 03/01/2013 0345   CREATININE 0.87 03/01/2013 0345   CALCIUM 10.1 03/01/2013 0345   PROT 8.1 02/28/2013 0350   ALBUMIN 4.0 02/28/2013 0350   AST 10 02/28/2013 0350   ALT 9 02/28/2013 0350   ALKPHOS 86 02/28/2013 0350   BILITOT 0.7 02/28/2013 0350   GFRNONAA >90 03/01/2013 0345   GFRAA >90 03/01/2013 0345   COAGS Lab Results  Component Value Date   INR 0.95 02/23/2013   Lipid Panel    Component Value Date/Time   CHOL 219* 02/26/2013 0420   TRIG 63 02/26/2013 0420   HDL 96 02/26/2013 0420   CHOLHDL 2.3 02/26/2013 0420   VLDL 13 02/26/2013 0420   LDLCALC 110* 02/26/2013 0420   HgbA1C  No results found for this basename: HGBA1C   Cardiac Panel (last 3 results)   Recent Labs  02/28/13 1240  CKTOTAL 36  CKMB 0.7  TROPONINI <0.30  RELINDX RELATIVE INDEX IS INVALID   Urinalysis    Component Value Date/Time   COLORURINE YELLOW 02/23/2013 2123   APPEARANCEUR CLEAR 02/23/2013 2123   LABSPEC 1.016 02/23/2013 2123   PHURINE 6.5 02/23/2013 2123   GLUCOSEU NEGATIVE 02/23/2013 2123   HGBUR NEGATIVE 02/23/2013 2123   BILIRUBINUR NEGATIVE 02/23/2013 2123   KETONESUR NEGATIVE  02/23/2013 2123   PROTEINUR NEGATIVE 02/23/2013 2123   UROBILINOGEN 0.2 02/23/2013 2123   NITRITE NEGATIVE 02/23/2013 2123   LEUKOCYTESUR NEGATIVE 02/23/2013 2123   Urine Drug Screen    Component Value Date/Time   LABOPIA NONE DETECTED 02/23/2013 2123   COCAINSCRNUR POSITIVE* 02/23/2013 2123   LABBENZ NONE DETECTED 02/23/2013 2123   AMPHETMU NONE DETECTED 02/23/2013 2123   THCU POSITIVE* 02/23/2013 2123   LABBARB NONE DETECTED 02/23/2013 2123    Alcohol Level    Component Value Date/Time   ETH 31* 02/23/2013 2016     SIGNIFICANT DIAGNOSTIC STUDIES CT of the brain  02/25/2013 Stable to slightly improved right basal ganglia intraparenchymal  hematoma. Slight decrease right-to-left shift now approximately 3-4  mm.  02/24/2013 Acute intraparenchymal hemorrhage in the right temporal parietal region appears  similar to slightly larger compared to the head CT dated 02/23/2013. On the current head CT, there is now intraventricular hemorrhage, filling approximately 75% of the right lateral ventricle. Right to left midline shift measures 5 mm (previously 4 mm).  02/23/2013 Acute parenchymal hemorrhage over the right temporal parietal region measuring 4.9 x 3.2 cm in its AP and transverse dimensions with local mass effect and midline shift to the left of 4 mm.  CT angio head 02/24/2013 1. No intracranial aneurysm identified. 2. Slight interval enlargement of right frontal temporal hematoma now measuring 5.2 x 3.3 cm, previously 4.9 x 3.2 cm. Associated vasogenic edema is slightly increased. 4 mm of right-to-left midline shift is stable. 3. Interval development of intraventricular hemorrhage within the right lateral and 3rd ventricles. No hydrocephalus. 4. Mild stenosis of approximately 30% within the cavernous right ICA. No high-grade flow-limiting stenosis or occlusion identified within the intracranial circulation.  CT angio neck 02/24/2013 1. No high-grade stenosis, dissection, or other abnormality identified within the vasculature of the neck.  MRI of the brain 02/24/2013: No significant change in an intraparenchymal hematoma centered in the right lateral basal ganglia/ external capsule region measuring approximately 5.2 x 3.7 x 3.3 cm. Surrounding vasogenic edema. Right-to-left shift of 6 mm. Intraventricular penetration with blood in the right lateral ventricle. No hydrocephalus.  2D Echocardiogram Normal LV size and systolic function, EF 60-65%. Normal RV size and systolic function. No significant valvular abnormalities. Carotid Doppler Findings suggest 1-39% internal carotid artery stenosis bilaterally. Unable to visualize the right vertebral artery, the left vertebral artery is patent with antegrade flow.  EKG Normal sinus rhythm. Possible Left atrial enlargement. Incomplete right bundle branch block. Left  ventricular hypertrophy. Abnormal ECG.      History of Present Illness   Eric May is an 53 y.o. male who was at work today 02/23/2013 and found down by co-workers with left sided weakness. The patient reports that he was doing well until he went to the bathroom at about 1845. He reports falling at that time due to weakness on the left. EMS was called and [facility]. Patient was not a TPA candidate secondary to ICH. He was admitted to the neuro ICU for further evaluation and treatment.  Hospital Course Imaging confirms a right temporoparietal intracerebral hemorrhage with mass effect and 4 mm midline shift; cytotoxic cerebral edema and intraventricular extension. 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 Developed seizure in hospital secondary to hemorrhage. He was on 1no scheduled antithrombotics prior to admission.   Patient with vascular risk factors of:  Malignant hypertension,  194/117 on arrival. Not on antihypertensives prior to admission. Placed on cardene. Ongoing periodic use during first several days. Able to wean off cardene. Placed on Norvasc 10mg  and HCTZ 25mg .  Cigarette smoker, advised to stop etoh use, 6-pk/day, advised to stop  THC positive, advised to stop Cocaine positive , advised to stop Hyperlipidemia, consider statin in future  Patient also with: Etoh, cocaine withdrawal. Placed on CIWA protocol for evaluation, treatment with thiamine and folic acie New acute generalized seizure. Treated with ativan and Keppra 500mg  BID. Leukocytosis, improving, secondary to hemorrhage Hypokalemia, replaced frequently, needs ongoing f/u Leg and Chest pain, cardiac enzymes negative, ? myositis   from cocaine use  Patient with resultant left hemiparesis, dysarthria, dysphagia. Physical therapy, occupational therapy and speech therapy evaluated patient. All agreed  inpatient rehab is needed. Patient's family is/are supportive and will consider providing care at discharge. CIR bed is available today and patient will be transferred there.  Discharge Exam  Blood pressure 168/102, pulse 61, temperature 97.9 F (36.6 C), temperature source Oral, resp. rate 18, height 6' (1.829 m), weight 62.7 kg (138 lb 3.7 oz), SpO2 100.00%.  General Examination:  HEENT- Normocephalic, no lesions, without obvious abnormality. Normal external nose, mucus membranes and septum.  Cardiovascular - S1, S2 normal  Lungs - chest clear, no wheezing, rales, normal symmetric air entry, Heart exam - S1, S2 normal, no murmur, no gallop, rate regular  Abdomen - soft, non-tender; bowel sounds normal; no masses, no organomegaly  Extremities - no edema  Neurologic Examination:  Mental Status:  alert, oriented to name/2015 but not day or month, follows simple commands with Right side, Left neglect. dysarthric  Cranial Nerves:  II: decreased blink to threat in the L VF, pupils equal, round, reactive to light  III,IV, VI: ptosis not present, right gaze preference with patient unable to go beyond midline to the left.  V,VII: left facial droop, facial light touch sensation decreased on the left  VIII: hearing normal bilaterally  XII: midline tongue extension  Motor:  Right : Upper extremity 5/5 Left: Upper extremity 1-2/5  Lower extremity 5/5 Lower extremity 2/5  Tone and bulk:normal tone throughout; no atrophy noted  Sensory: Pinprick and light touch decreased on the left  Deep Tendon Reflexes: 2+ and symmetric throughout  Plantars:  Right: downgoing Left: mute  Cerebellar:  Patient not following command  Gait: Unable to test    Discharge Diet  Dysphagia 1 honey thick liquids  Discharge Plan  Disposition:  Transfer to [facility] for ongoing PT, OT and ST  Consider statin in the furutre  Follow K on rehab. Replace as indicated  Stop using drugs and  etoh  Recommend ongoing risk factor control by Primary Care Physician at time of discharge from inpatient rehabilitation.  Follow-up No PCP Per Patient in 1 month following discharge from rehab.  Follow-up with Dr. Delia Heady, Stroke Clinic in 2 months.  30 minutes were spent preparing discharge.  Signed Annie Main, AVNP, ANP-BC, [facility] Nurse Practitioner 03/02/2013, 12:20 PM  I have personally examined this patient, reviewed pertinent data and developed the plan of care. I agree with above.  Delia Heady, MD

## 2013-03-02 NOTE — Progress Notes (Signed)
Physical Therapy Treatment Patient Details Name: Eric May MRN: [id number] DOB: 1960/08/08 Today's Date: 03/02/2013 Time: [id number] PT Time Calculation (min): 16 min  PT Assessment / Plan / Recommendation  History of Present Illness 53 yo with ICH - R BG/ext capsule   PT Comments   Patient lethargic and easily agitated when attempting standing and taking steps this morning. Patient attempting to sit without warning. Continue to recommend CIR. Possible CIR placement today  Follow Up Recommendations  CIR     Does the patient have the potential to tolerate intense rehabilitation     Barriers to Discharge        Equipment Recommendations       Recommendations for Other Services    Frequency Min 4X/week   Progress towards PT Goals Progress towards PT goals: Not progressing toward goals - comment  Plan Current plan remains appropriate    Precautions / Restrictions Precautions Precautions: Fall Precaution Comments: pt con't to be agitated at onset of pain Restrictions Weight Bearing Restrictions: No   Pertinent Vitals/Pain Agiated. RN aware of pain issues   Mobility    Sit to stand  +2 total assist from recliner Cues for technique and patient with heavy left lean. A to control balance and posture   Ambulation/Gait Ambulation Distance (Feet): 2 Feet Gait velocity: slow Modified Rankin (Stroke Patients Only) Pre-Morbid Rankin Score: No symptoms Modified Rankin: Severe disability    Exercises     PT Diagnosis:    PT Problem List:   PT Treatment Interventions:     PT Goals (current goals can now be found in the care plan section)    Visit Information  Last PT Received On: 03/02/13 Assistance Needed: +2 History of Present Illness: 53 yo with ICH - R BG/ext capsule    Subjective Data      Cognition  Cognition Arousal/Alertness: Lethargic Behavior During Therapy: Restless Overall Cognitive Status: Impaired/Different from baseline Area of Impairment:  Following commands;Safety/judgement;Awareness;Problem solving Current Attention Level: Focused Memory: Decreased short-term memory Following Commands: Follows one step commands with increased time Safety/Judgement: Decreased awareness of safety;Decreased awareness of deficits Awareness: Intellectual Problem Solving: Slow processing;Decreased initiation;Requires verbal cues;Difficulty sequencing;Requires tactile cues    Balance     End of Session PT - End of Session Equipment Utilized During Treatment: Gait belt Activity Tolerance: Patient limited by fatigue Patient left: in chair;with call bell/phone within reach;with family/visitor present   GP     Robinette, Eric May 03/02/2013, 11:50 AM

## 2013-03-02 NOTE — Interval H&P Note (Signed)
Eric May was admitted today to Inpatient Rehabilitation with the diagnosis of right temporo-parietal ICH.  The patient's history has been reviewed, patient examined, and there is no change in status.  Patient continues to be appropriate for intensive inpatient rehabilitation.  I have reviewed the patient's chart and labs.  Questions were answered to the patient's satisfaction.  SWARTZ,ZACHARY T 03/02/2013, 8:58 PM

## 2013-03-02 NOTE — PMR Pre-admission (Signed)
PMR Admission Coordinator Pre-Admission Assessment  Patient: Eric May is an 53 y.o., male MRN: [id number] DOB: 1961/02/03 Height: 6' (182.9 cm) Weight: 62.7 kg (138 lb 3.7 oz)              Insurance Information HMO:     PPO:      PCP:      IPA:      80/20:      OTHER:  Self pay/ no insurance  Medicaid Application Date:       Case Manager:  Disability Application Date:       Case Worker:  03/02/13 I contacted Maddie woods with financial counselor to request disbility and medicaid applications to be initiated. Instructed to call [name] who will assist Mom in completing.  Emergency Contact Information Contact Information   Name Relation Home Work Mobile   [name],Ruby Mother [phone]     Smith,[name] Significant other   [phone]     Current Medical History  Patient Admitting Diagnosis: right temporal-parietal ICH  History of Present Illness:Eric May is a 53 y.o. right-handed male with history of hypertension. Admitted 02/23/2013 the patient developed left-sided numbness and weakness while at work. Patient reports falling at that time due to weakness on the left. Blood pressure 194/117. CT of the head showed acute parenchymal hemorrhage over the right temporal parietal region measuring 4.9 x 3.2 cm with local mass effect and midline shift of 4 mm. Echocardiogram with ejection fraction 65% no valvular abnormalities. Carotid Dopplers with no ICA stenosis. MRI of the brain 02/24/2013 again showed intraparenchymal hematoma 5.2 x 3.7 x 3.3 cm with surrounding vasogenic edema. Neurosurgery Dr. Conchita Paris consult advise conservative management with repeat serial cranial CT scans. Urine drug screen was positive for cocaine as well as marijuana. Maintained on Cardene drip for blood pressure control. Off cardene, on Norvasc.  New acute generalized seizure. Treated with ativan and Keppra 500mg  BID.  Etoh, crack cocaine and THC positive on admission. CIWA protocol.  .Total: 11 NIH     Past Medical History  Past Medical History  Diagnosis Date  . Hypertension     Family History  family history is not on file.  Prior Rehab/Hospitalizations: none  Current Medications  Current facility-administered medications:0.9 %  sodium chloride infusion, , Intravenous, Continuous, Layne Benton, NP, Last Rate: 50 mL/hr at 03/02/13 0716, 1,000 mL at 03/02/13 0716;  acetaminophen (TYLENOL) suppository 650 mg, 650 mg, Rectal, Q4H PRN, Thana Farr, MD, 650 mg at 02/25/13 2101;  acetaminophen (TYLENOL) tablet 650 mg, 650 mg, Oral, Q4H PRN, Thana Farr, MD, 650 mg at 02/28/13 2329 amLODipine (NORVASC) tablet 10 mg, 10 mg, Oral, Daily, Omelia Blackwater, DO, 10 mg at 03/01/13 1104;  antiseptic oral rinse (BIOTENE) solution 15 mL, 15 mL, Mouth Rinse, BID, Omelia Blackwater, DO, 15 mL at 03/01/13 2119;  folic acid (FOLVITE) tablet 1 mg, 1 mg, Oral, Daily, Omelia Blackwater, DO, 1 mg at 03/01/13 1103;  hydrochlorothiazide (HYDRODIURIL) tablet 25 mg, 25 mg, Oral, Daily, Omelia Blackwater, DO, 25 mg at 03/01/13 1104 labetalol (NORMODYNE,TRANDATE) injection 10-20 mg, 10-20 mg, Intravenous, Q10 min PRN, Layne Benton, NP;  levETIRAcetam (KEPPRA) tablet 500 mg, 500 mg, Oral, BID, Drake Leach Rumbarger, RPH, 500 mg at 03/01/13 2119;  LORazepam (ATIVAN) injection 1 mg, 1 mg, Intravenous, Q1H PRN, Thana Farr, MD;  multivitamin with minerals tablet 1 tablet, 1 tablet, Oral, Daily, Omelia Blackwater, DO, 1 tablet at 03/01/13 1105 niCARdipine (CARDENE-IV) double-strength infusion (0.2 mg/ml), 3-15 mg/hr,  Intravenous, Continuous, Omelia Blackwater, DO, 2 mg/hr at 02/28/13 0900;  ondansetron [facility] [place]) injection 4 mg, 4 mg, Intravenous, Q8H PRN, Thana Farr, MD, 4 mg at 02/28/13 0039;  pantoprazole (PROTONIX) EC tablet 40 mg, 40 mg, Oral, QHS, Omelia Blackwater, DO, 40 mg at 03/01/13 2119 potassium chloride SA (K-DUR,KLOR-CON) CR tablet 20 mEq, 20 mEq, Oral, Daily, Layne Benton, NP, 20  mEq at 03/01/13 1103;  senna-docusate (Senokot-S) tablet 1 tablet, 1 tablet, Oral, BID, Thana Farr, MD, 1 tablet at 03/01/13 2119;  thiamine (VITAMIN B-1) tablet 100 mg, 100 mg, Oral, Daily, Omelia Blackwater, DO, 100 mg at 03/01/13 1106  Patients Current Diet: Dysphagia 3 diet with thin liquids  Precautions / Restrictions Precautions Precautions: Fall Precaution Comments: pt con't to be agitated at onset of pain Restrictions Weight Bearing Restrictions: No   Prior Activity Level Community (5-7x/wk): 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, newspaper / Equipment Home Assistive Devices/Equipment: None Home Equipment: Bedside commode;Other (comment) (BSC belongs toMom)  Prior Functional Level Prior Function Level of Independence: Independent 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  Current Functional Level Cognition  Arousal/Alertness: Lethargic Overall Cognitive Status: Impaired/Different from baseline Current Attention Level: Focused Orientation Level: Oriented X4 Following Commands: Follows one step commands with increased time Safety/Judgement: Decreased awareness of safety;Decreased awareness of deficits General Comments: pt easily distracted, easily agitated at onset of pain Attention: Focused Focused Attention: Impaired Focused Attention Impairment: Verbal basic;Functional basic Memory: Impaired Memory Impairment: Storage deficit;Decreased short term memory Decreased Short Term Memory: Verbal basic Awareness: Impaired Awareness Impairment: Intellectual impairment;Emergent impairment Problem Solving: Impaired Problem Solving Impairment: Verbal basic;Functional basic Executive Function: Reasoning;Self Monitoring Reasoning: Impaired Reasoning Impairment: Verbal basic;Functional basic Self Monitoring: Impaired Self Monitoring Impairment: Verbal basic;Functional basic Behaviors: Restless;Impulsive;Physical agitation;Verbal  agitation;Poor frustration tolerance Safety/Judgment: Impaired    Extremity Assessment (includes Sensation/Coordination)          ADLs  Eating/Feeding: Other (comment) (modified diet) ADL Comments: total A with all ADL at this time    Mobility  Bed Mobility: Supine to Sit;Sitting - Scoot to [place] of Bed Rolling Right: 5: Supervision Rolling Left: 5: Supervision Supine to Sit: 1: +2 Total assist Supine to Sit: Patient Percentage: 50% Sitting - Scoot to Edge of Bed: 1: +2 Total assist Sitting - Scoot to Edge of Bed: Patient Percentage: 60% Sit to Supine: 3: Mod assist    Transfers  Transfers: Sit to Stand;Stand to Sit Sit to Stand: 1: +2 Total assist Sit to Stand: Patient Percentage: 60% Stand to Sit: 1: +2 Total assist Stand to Sit: Patient Percentage: 60% Stand Pivot Transfers: 1: +2 Total assist Stand Pivot Transfers: Patient Percentage: 50%    Ambulation / Gait / Stairs / Wheelchair Mobility  Ambulation/Gait Ambulation/Gait Assistance: 1: +2 Total assist Ambulation/Gait: Patient Percentage: 40% Ambulation Distance (Feet): 5 Feet Assistive device: 2 person hand held assist Ambulation/Gait Assistance Details: maxA to advance L LE, pt able to initiate but unable to clear foot. Pt requires blocking of left knee during stande phase to prevent buckling. Gait Pattern: Step-to pattern;Decreased step length - left;Decreased stance time - left;Left steppage;Left flexed knee in stance;Narrow base of support Gait velocity: slow General Gait Details: pt with onset of flank pain during ambulation limiting duration    Posture / Balance Static Sitting Balance Static Sitting - Balance Support: Right upper extremity supported Static Sitting - Level of Assistance: 4: Min assist Static Sitting - Comment/# of Minutes: pt con't to require UE  assist to maitain sitting balance Static Standing Balance Static Standing - Balance Support: Bilateral upper extremity supported Static Standing -  Level of Assistance: 1: +2 Total assist Static Standing - Comment/# of Minutes: max verbal and tactile cues to achieve upright posture    Special needs/care consideration Bowel mgmt: continent Bladder mgmt: condom catheter   Previous Home Environment Living Arrangements: Other (Comment) (lived between Mom's home and GF's home)  Lives With: Other (Comment) Available Help at Discharge: Available PRN/intermittently Type of Home: House Home Layout: One level Home Access: Stairs to enter Entrance Stairs-Rails: None Entrance Stairs-Number of Steps: 4 Bathroom Shower/Tub: Tub only Teacher, early years/preBathroom Toilet: Standard Bathroom Accessibility: No Home Care Services: No Additional Comments: Mom's home not w/c accessible and Mom worries about even RW  Discharge Living Setting Plans for Discharge Living Setting: Other (Comment) (once burden of care goals established, SNF) Type of Home at Discharge: Skilled Nursing Facility Care Facility Name at Discharge: Facility with letter of guarantee Does the patient have any problems obtaining your medications?: Yes (Describe) (no insurance therfore no BP meds or PCP for years)  Social/Family/Support Systems 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 unemployed for past 3 weeks Caregiver Availability: Intermittent Discharge Plan Discussed with Primary Caregiver: Yes Is Caregiver In Agreement with Plan?: Yes Does Caregiver/Family have Issues with Lodging/Transportation while Pt is in Rehab?: No ([name] abd Mom come from [place] daily; Mom does not drive)  Goals/Additional Needs Patient/Family Goal for Rehab: Min assist PT, OT. and SLP; one caregiver support, education of  new ICH, initiation of disability and Medic[employer] apps, set up of community access to medical care Expected length of stay: ELOS 7 to 14 days Additional Information: family meeting to discuss goals with pt, Mom and girlfriend 03/02/13 at 0930 Pt/Family Agrees to Admission and willing to participate: Yes Program Orientation Provided & Reviewed with Pt/Caregiver Including Roles  & Responsibilities: Yes   Decrease burden of Care through IP rehab admission: Decrease number of caregivers, Patient/family education and Other1.  Initiation of disability and medicaid applications, education of new ICH, reduce care to one caregiver, set up community access to medical care for eventual d/c home after SNF rehab. D/C home once intermittent supervision level  Possible need for SNF placement upon discharge: yes   Patient Condition: This patient's medical and functional status has changed since the consult dated: 02/28/13 in which the Rehabilitation Physician determined and documented that the patient's condition is appropriate for intensive rehabilitative care in an inpatient rehabilitation facility. See "History of Present Illness" (above) for medical update. Functional changes are: overall max assist with two assist. Patient's medical and functional status update has been discussed with the Rehabilitation physician and patient remains appropriate for inpatient rehabilitation. Will admit to inpatient rehab today.  Preadmission Screen Completed By:  Clois DupesBoyette, Calan Doren Godwin, 03/02/2013 10:43 AM ______________________________________________________________________   Discussed status with Dr. Riley KillSwartz on 03/02/13 at 1043 and received telephone approval for admission today.  Admission Coordinator:  Clois DupesBoyette, Angellica Maddison Godwin, time [id number] Date 03/02/13.

## 2013-03-02 NOTE — H&P (View-Only) (Signed)
Physical Medicine and Rehabilitation Admission H&P    Chief Complaint  Patient presents with  . Code Stroke  :  Chief complaint: Headache  HPI: Eric May is a 52 y.o. right-handed male with history of hypertension, tobacco and alcohol abuse. Admitted 02/23/2013 when patient developed left-sided numbness and weakness while at work. Patient reports falling at that time due to weakness on the left. Blood pressure 194/117. CT of the head showed acute parenchymal hemorrhage over the right temporal parietal region measuring 4.9 x 3.2 cm with local mass effect and midline shift of 4 mm. Echocardiogram with ejection fraction 65% no valvular abnormalities. Carotid Dopplers with no ICA stenosis. MRI of the brain 02/24/2013 again showed intraparenchymal hematoma 5.2 x 3.7 x 3.3 cm with surrounding vasogenic edema. Neurosurgery Dr. Nundkumar consult advise conservative management with repeat serial cranial CT scans. Urine drug screen was positive for cocaine as well as marijuana. Maintained on Cardene drip for blood pressure control.  Diet advance to dysphagia 3 thin liquids 03/01/2013. Keppra for seizure prophylaxis. Physical therapy evaluation completed 02/26/2013 with recommendations for physical medicine rehabilitation consult to consider inpatient rehabilitation services. Patient was felt to be a good candidate for inpatient rehabilitation services and was admitted for comprehensive rehabilitation program   Review of Systems  Musculoskeletal: Positive for myalgias.  Neurological: Positive for headaches.  All other systems reviewed and are negative.   Past Medical History  Diagnosis Date  . Hypertension    History reviewed. No pertinent past surgical history. No family history on file. Social History:  reports that he has been smoking.  He does not have any smokeless tobacco history on file. He reports that he drinks alcohol. He reports that he uses illicit drugs (Marijuana). Allergies: No  Known Allergies Medications Prior to Admission  Medication Sig Dispense Refill  . meclizine (ANTIVERT) 12.5 MG tablet Take 12.5 mg by mouth 3 (three) times daily as needed for nausea.      . naproxen sodium (ANAPROX) 220 MG tablet Take 440 mg by mouth 2 (two) times daily as needed (for pain).        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  Functional Status:  Mobility: Bed Mobility Bed Mobility: Supine to Sit;Sitting - Scoot to Edge of Bed Rolling Right: 5: Supervision Rolling Left: 5: Supervision Supine to Sit: 1: +2 Total assist Supine to Sit: Patient Percentage: 50% Sitting - Scoot to Edge of Bed: 1: +2 Total assist Sitting - Scoot to Edge of Bed: Patient Percentage: 50% Sit to Supine: 3: Mod assist Transfers Transfers: Sit to Stand;Stand to Sit;Stand Pivot Transfers Sit to Stand: 1: +2 Total assist Sit to Stand: Patient Percentage: 50% Stand to Sit: 1: +2 Total assist Stand to Sit: Patient Percentage: 50% Stand Pivot Transfers: 1: +2 Total assist Stand Pivot Transfers: Patient Percentage: 50% Ambulation/Gait Ambulation/Gait Assistance: Not tested (comment)    ADL: ADL Eating/Feeding: Other (comment) (modified diet) ADL Comments: total A with all ADL at this time  Cognition: Cognition Overall Cognitive Status: Impaired/Different from baseline Arousal/Alertness: Lethargic Orientation Level: Oriented X4 Attention: Focused Focused Attention: Impaired Focused Attention Impairment: Verbal basic;Functional  basic Memory: Impaired Memory Impairment:   Storage deficit;Decreased short term memory Decreased Short Term Memory: Verbal basic Awareness: Impaired Awareness Impairment: Intellectual impairment;Emergent impairment Problem Solving: Impaired Problem Solving Impairment: Verbal basic;Functional basic Executive Function: Reasoning;Self Monitoring Reasoning: Impaired Reasoning Impairment: Verbal basic;Functional basic Self Monitoring: Impaired Self Monitoring Impairment: Verbal basic;Functional basic Behaviors: Restless;Impulsive;Physical agitation;Verbal agitation;Poor frustration tolerance Safety/Judgment: Impaired Cognition Arousal/Alertness: Lethargic Behavior During Therapy: Restless;Agitated Overall Cognitive Status: Impaired/Different from baseline Area of Impairment: Attention;Following commands;Safety/judgement;Awareness;Problem solving Orientation Level:  (pt oriented this date) Current Attention Level: Focused Memory: Decreased short-term memory Following Commands: Follows one step commands with increased time Safety/Judgement: Decreased awareness of safety;Decreased awareness of deficits Awareness: Intellectual Problem Solving: Slow processing;Decreased initiation;Requires verbal cues;Difficulty sequencing;Requires tactile cues General Comments: pt easily frustrated  Physical Exam: Blood pressure 152/102, pulse 60, temperature 98.1 F (36.7 C), temperature source Axillary, resp. rate 13, height 6' (1.829 m), weight 62.7 kg (138 lb 3.7 oz), SpO2 96.00%. Physical Exam Physical Exam  Vitals reviewed.  Constitutional: He appears lethargic, no distress. Sleeping upon my entry HENT: poor dentition, thrush over tongue Head: Normocephalic.  Eyes:  Pupils round and reactive to light without nystagmus  Neck: Normal range of motion. Neck supple. No thyromegaly present.  Cardiovascular: Normal rate and regular rhythm. No murmurs Respiratory: Effort normal and breath sounds normal.  No respiratory distress. No wheezes, rales, rhonchi GI: Soft. Bowel sounds are normal. He exhibits no distension.  Neurological:  Patient is lethargic but arousable. Oriented to name and year but not date or month. He did follow some simple commands. Generally answered "yeah" to most of my questions. Did appear to have some trace movements of the left shoulder but otherwise was 0/5. LLE was 1-2/5 with HF and KE and trace at the ankle.  Withdraws to deep pain stimulation in both the arm and the left leg. Speech quite dysarthric.    Skin: Skin is warm and dry.  Psychiatric:  Flat, lethargic   Results for orders placed during the hospital encounter of 02/23/13 (from the past 48 hour(s))  HEPATIC FUNCTION PANEL     Status: None   Collection Time    02/28/13  3:50 AM      Result Value Range   Total Protein 8.1  6.0 - 8.3 g/dL   Albumin 4.0  3.5 - 5.2 g/dL   AST 10  0 - 37 U/L   ALT 9  0 - 53 U/L   Alkaline Phosphatase 86  39 - 117 U/L   Total Bilirubin 0.7  0.3 - 1.2 mg/dL   Bilirubin, Direct <0.2  0.0 - 0.3 mg/dL   Indirect Bilirubin NOT CALCULATED  0.3 - 0.9 mg/dL  AMMONIA     Status: None   Collection Time    02/28/13  3:50 AM      Result Value Range   Ammonia 55  11 - 60 umol/L  TSH     Status: None   Collection Time    02/28/13  3:50 AM      Result Value Range   TSH 3.299  0.350 - 4.500 uIU/mL   Comment: Performed at [facility]  VITAMIN B12     Status: None   Collection Time    02/28/13  3:50 AM      Result Value Range   Vitamin B-12 555  211 - 911 pg/mL   Comment: Performed at [facility]  CK TOTAL AND CKMB     Status: None   Collection Time    02/28/13 12:40 PM        Result Value Range   Total CK 36  7 - 232 U/L   CK, MB 0.7  0.3 - 4.0 ng/mL   Relative Index RELATIVE INDEX IS INVALID  0.0 - 2.5   Comment: WHEN CK < 100 U/L             TROPONIN I     Status: None   Collection Time    02/28/13 12:40 PM      Result Value Range   Troponin I <0.30   <0.30 ng/mL   Comment:            Due to the release kinetics of cTnI,     a negative result within the first hours     of the onset of symptoms does not rule out     myocardial infarction with certainty.     If myocardial infarction is still suspected,     repeat the test at appropriate intervals.  BASIC METABOLIC PANEL     Status: Abnormal   Collection Time    03/01/13  3:45 AM      Result Value Range   Sodium 141  137 - 147 mEq/L   Potassium 3.6 (*) 3.7 - 5.3 mEq/L   Chloride 99  96 - 112 mEq/L   CO2 26  19 - 32 mEq/L   Glucose, Bld 99  70 - 99 mg/dL   BUN 17  6 - 23 mg/dL   Creatinine, Ser 0.87  0.50 - 1.35 mg/dL   Calcium 10.1  8.4 - 10.5 mg/dL   GFR calc non Af Amer >90  >90 mL/min   GFR calc Af Amer >90  >90 mL/min   Comment: (NOTE)     The eGFR has been calculated using the CKD EPI equation.     This calculation has not been validated in all clinical situations.     eGFR's persistently <90 mL/min signify possible Chronic Kidney     Disease.   No results found.  Post Admission Physician Evaluation: 1. Functional deficits secondary  to right temporo-parietal ICH. 2. Patient is admitted to receive collaborative, interdisciplinary care between the physiatrist, rehab nursing staff, and therapy team. 3. Patient's level of medical complexity and substantial therapy needs in context of that medical necessity cannot be provided at a lesser intensity of care such as a SNF. 4. Patient has experienced substantial functional loss from his/her baseline which was documented above under the "Functional History" and "Functional Status" headings.  Judging by the patient's diagnosis, physical exam, and functional history, the patient has potential for functional progress which will result in measurable gains while on inpatient rehab.  These gains will be of substantial and practical use upon discharge  in facilitating mobility and self-care at the household level. 5. Physiatrist will provide  24 hour management of medical needs as well as oversight of the therapy plan/treatment and provide guidance as appropriate regarding the interaction of the two. 6. 24 hour rehab nursing will assist with bladder management, bowel management, safety, skin/wound care, disease management, medication administration, pain management and patient education  and help integrate therapy concepts, techniques,education, etc. 7. PT will assess and treat for/with: Lower extremity strength, range of motion, stamina, balance, functional mobility, safety, adaptive techniques and equipment, NMR, family education.   Goals are: min assist. 8. OT will assess and treat for/with: ADL's, functional mobility, safety, upper extremity strength, adaptive techniques and equipment, NMR, cognitive perceptual rx, family ed.   Goals are: min to mod assist. 9.   SLP will assess and treat for/with: speech, swallowing, cognition, education.  Goals are: supervision to min assist. 10. Case Management and Social Worker will assess and treat for psychological issues and discharge planning. 11. Team conference will be held weekly to assess progress toward goals and to determine barriers to discharge. 12. Patient will receive at least 3 hours of therapy per day at least 5 days per week. 13. ELOS: 10-20 days (at the lower end of the range if this is a reduce burden of care situation or if he doesn't participate in therapies.)       14. Prognosis:  good   Medical Problem List and Plan: 1. 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 DVT Prophylaxis/Anticoagulation: SCDs. Monitor for any signs of DVT 3. Pain Management: Tylenol as needed 4. Neuropsych: This patient is not capable of making decisions on his own behalf. 5. Seizure prophylaxis. Keppra 500 mg twice a day. Monitor for any seizure activity 6. Hypertension. Norvasc 10 mg daily, hydrochlorothiazide 25 mg daily. Monitor with increased mobility 7.  Dysphasia.[name] dysphagia 3 thin liquids. Followup speech therapy 8. History of tobacco alcohol abuse. Urine drug screen positive cocaine and marijuana. Provide counseling as appropriate 9. Thrush: 3 days of diflucan  Meredith Staggers, MD, [facility]  03/01/2013

## 2013-03-02 NOTE — Progress Notes (Signed)
Met with pt, [name] his girlfriend, and his Mom at bedside. Discussed rehab venue goals and plans. They do not feel they can provide 24/7 assist after a short inpt rehab stay. We established goals to admit pt to inpt rehab to work towards reducing burden of care prior to discharge to SNF for continued rehab recovery. I will arrange for today. [phone]

## 2013-03-02 NOTE — Progress Notes (Signed)
Speech Language Pathology Treatment: Dysphagia;Cognitive-Linquistic  Patient Details Name: Eric May MRN: [id number] DOB: 05-03-1960 Today's Date: 03/02/2013 Time: [id number] SLP Time Calculation (min): 26 min  Assessment / Plan / Recommendation Clinical Impression  Pt again making significant gains today. Pt sat edge of bed with min verbal cues and min assist from SLP, completed multistep commands for stand pivot transfer to chair with moderate verbal and tactile cues (50% assist needed from therapist??). Pt required min verbal cues to initiate self feeding and sustain attention to task, increased cueing needed in distracting environment. Intellectual and emergent awareness improving with assistance. Pt tolerated thin liquids when given via straw though decreased oral control did result in probable sensed aspiration event with first sip. Pt became visibly discouraged by this small event and needed coaching to recognize that he was progressing.  With straw sips placed in right side of mouth, pt with no evidence of aspiration. Min verbal cues needed to check for pocketing on left.  Family very supportive during session   HPI HPI: 53 y.o. male presenting with left sided numbness and weakness. Imaging confirms a right temporoparietal intracerebral hemorrhage with mass effect and 44 mm midline shift; cytotoxic cerebral edema. 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   Pertinent Vitals NA  SLP Plan  Continue with current plan of care    Recommendations Diet recommendations: Dysphagia 3 (mechanical soft);Thin liquid Liquids provided via: Straw Medication Administration: Whole meds with puree Supervision: Staff to assist with self feeding Compensations: Slow rate;Small sips/bites;Check for pocketing;Check for anterior loss Postural Changes and/or Swallow Maneuvers: Seated upright 90 degrees              General recommendations: Rehab consult Oral Care Recommendations: Oral care BID Follow up Recommendations: Inpatient Rehab Plan: Continue with current plan of care    GO    [facility] Lee'S SummitBonnie Sayyid Hare[place], MA CCC-SLP [phone]  Claudine MoutonDeBlois, Luther Newhouse Caroline 03/02/2013, 9:39 AM

## 2013-03-03 ENCOUNTER — Inpatient Hospital Stay (HOSPITAL_COMMUNITY): Payer: Medicaid Other

## 2013-03-03 ENCOUNTER — Inpatient Hospital Stay (HOSPITAL_COMMUNITY): Payer: Medicaid Other | Admitting: Speech Pathology

## 2013-03-03 ENCOUNTER — Inpatient Hospital Stay (HOSPITAL_COMMUNITY): Payer: Self-pay | Admitting: Occupational Therapy

## 2013-03-03 DIAGNOSIS — I619 Nontraumatic intracerebral hemorrhage, unspecified: Secondary | ICD-10-CM

## 2013-03-03 DIAGNOSIS — G8110 Spastic hemiplegia affecting unspecified side: Secondary | ICD-10-CM

## 2013-03-03 LAB — CBC WITH DIFFERENTIAL/PLATELET
Basophils Absolute: 0 10*3/uL (ref 0.0–0.1)
Basophils Relative: 1 % (ref 0–1)
EOS PCT: 1 % (ref 0–5)
Eosinophils Absolute: 0.1 10*3/uL (ref 0.0–0.7)
HCT: 48.3 % (ref 39.0–52.0)
HEMOGLOBIN: 16.4 g/dL (ref 13.0–17.0)
Lymphocytes Relative: 29 % (ref 12–46)
Lymphs Abs: 2.4 10*3/uL (ref 0.7–4.0)
MCH: 30.8 pg (ref 26.0–34.0)
MCHC: 34 g/dL (ref 30.0–36.0)
MCV: 90.8 fL (ref 78.0–100.0)
MONO ABS: 0.9 10*3/uL (ref 0.1–1.0)
Monocytes Relative: 12 % (ref 3–12)
NEUTROS ABS: 4.7 10*3/uL (ref 1.7–7.7)
NEUTROS PCT: 58 % (ref 43–77)
PLATELETS: 285 10*3/uL (ref 150–400)
RBC: 5.32 MIL/uL (ref 4.22–5.81)
RDW: 13.6 % (ref 11.5–15.5)
WBC: 8.1 10*3/uL (ref 4.0–10.5)

## 2013-03-03 LAB — COMPREHENSIVE METABOLIC PANEL
ALT: 12 U/L (ref 0–53)
AST: 13 U/L (ref 0–37)
Albumin: 4 g/dL (ref 3.5–5.2)
Alkaline Phosphatase: 93 U/L (ref 39–117)
BUN: 17 mg/dL (ref 6–23)
CO2: 29 meq/L (ref 19–32)
CREATININE: 0.89 mg/dL (ref 0.50–1.35)
Calcium: 10.3 mg/dL (ref 8.4–10.5)
Chloride: 96 mEq/L (ref 96–112)
GFR calc Af Amer: >90 mL/min (ref >90)
GFR calc non Af Amer: >90 mL/min (ref >90)
Glucose, Bld: 109 mg/dL — ABNORMAL HIGH (ref 70–99)
Potassium: 3.8 mEq/L (ref 3.7–5.3)
SODIUM: 140 meq/L (ref 137–147)
Total Bilirubin: 0.5 mg/dL (ref 0.3–1.2)
Total Protein: 8 g/dL (ref 6.0–8.3)

## 2013-03-03 NOTE — Progress Notes (Signed)
Physical Therapy Note  Patient Details  Name: Eric May MRN: [id number] Date of Birth: 1960/10/17 Today's Date: 03/03/2013  Patient refused 30 minute session in pm. "I have done enough for today. You need to leave me alone. I am not going to do any more therapy today." Patient in bed. Attempted to get patient to participate in LE exercises in bed. Patient became agitated and reported he was not going to participate in any more therapy for the day. Continue with therapy plan in am.    Alma FriendlyWindsor, Maryiah Olvey M 03/03/2013, 1:59 PM

## 2013-03-03 NOTE — Care Management Note (Signed)
[facility] Individual Statement of Services  Patient Name:  Eric May  Date:  03/03/2013  Welcome to the [facility].  Our goal is to provide you with an individualized program based on your diagnosis and situation, designed to meet your specific needs.  With this comprehensive rehabilitation program, you will be expected to participate in at least 3 hours of rehabilitation therapies Monday-Friday, with modified therapy programming on the weekends.  Your rehabilitation program will include the following services:  Physical Therapy (PT), Occupational Therapy (OT), Speech Therapy (ST), 24 hour per day rehabilitation nursing, Neuropsychology, Case Management (Social Worker), Rehabilitation Medicine, Nutrition Services and Pharmacy Services  Weekly team conferences will be held on Wednesday to discuss your progress.  Your Social Worker will talk with you frequently to get your input and to update you on team discussions.  Team conferences with you and your family in attendance may also be held.  Expected length of stay: 10-14 days Overall anticipated outcome: mod/max level/NHP  Depending on your progress and recovery, your program may change. Your Social Worker will coordinate services and will keep you informed of any changes. Your Social Worker's name and contact numbers are listed  below.  The following services may also be recommended but are not provided by the [facility]:   Driving Evaluations  Home Health Rehabiltiation Services  Outpatient Rehabilitation Services  Vocational Rehabilitation   Arrangements will be made to provide these services after discharge if needed.  Arrangements include referral to agencies that provide these services.  Your insurance has been verified to be:  Pending Medicaid-has not applied yet Your primary doctor is:  None  Pertinent information will be shared with your doctor and your  insurance company.  Social Worker:  Dossie DerBecky Shauna Bodkins, SW [phone] or ([phone]) [phone]  Information discussed with and copy given to patient by: [name], Chancy Smigiel G, 03/03/2013, 1:44 PM

## 2013-03-03 NOTE — Evaluation (Signed)
Physical Therapy Assessment and Plan  Patient Details  Name: Eric May MRN: [id number] Date of Birth: 1960-06-23  PT Diagnosis: Abnormal posture, Abnormality of gait, Difficulty walking, Dizziness and giddiness, Hemiparesis non-dominant, Impaired cognition, Impaired sensation and Pain in LLE Rehab Potential: Good ELOS: 10-14days   Today's Date: 03/03/2013 Time: 0800-0857 Time Calculation (min): 57 min  Problem List:  Patient Active Problem List   Diagnosis Date Noted  . ICH (intracerebral hemorrhage) 03/02/2013  . Convulsions/seizures 02/24/2013  . Intracerebral hemorrhage 02/23/2013    Past Medical History:  Past Medical History  Diagnosis Date  . Hypertension    Past Surgical History: No past surgical history on file.  Assessment & Plan Clinical Impression: Eric May is a 53 y.o. right-handed male with history of hypertension, tobacco and alcohol abuse. Admitted 02/23/2013 when patient developed left-sided numbness and weakness while at work. Patient reports falling at that time due to weakness on the left. Blood pressure 194/117. CT of the head showed acute parenchymal hemorrhage over the right temporal parietal region measuring 4.9 x 3.2 cm with local mass effect and midline shift of 4 mm. Echocardiogram with ejection fraction 65% no valvular abnormalities. Carotid Dopplers with no ICA stenosis. MRI of the brain 02/24/2013 again showed intraparenchymal hematoma 5.2 x 3.7 x 3.3 cm with surrounding vasogenic edema. Neurosurgery Dr. Kathyrn Sheriff consult advise conservative management with repeat serial cranial CT scans. Urine drug screen was positive for cocaine as well as marijuana. Maintained on Cardene drip for blood pressure control.  Diet advance to dysphagia 3 thin liquids 03/01/2013. Keppra for seizure prophylaxis. Physical therapy evaluation completed 02/26/2013 with recommendations for physical medicine rehabilitation consult to consider inpatient rehabilitation  services. Patient was felt to be a good candidate for inpatient rehabilitation services and was admitted for comprehensive rehabilitation program. Patient transferred to CIR on 03/02/2013 .   Patient currently requires total with mobility secondary to muscle weakness, impaired timing and sequencing, motor apraxia, decreased coordination and decreased motor planning, decreased visual perceptual skills and decreased visual motor skills, decreased attention to left, decreased problem solving, decreased safety awareness, decreased memory and delayed processing and decreased sitting balance, decreased standing balance, decreased postural control, hemiplegia and decreased balance strategies.  Prior to hospitalization, patient was independent  with mobility and lived with Family (Mother) in a House home.  Home access is Pt unable to recallStairs to enter.  Patient will benefit from skilled PT intervention to maximize safe functional mobility, minimize fall risk and decrease caregiver burden for planned discharge SNF.  Anticipate patient will d/c to SNF at discharge.  PT - End of Session Activity Tolerance: Tolerates 10 - 20 min activity with multiple rests Endurance Deficit: Yes Endurance Deficit Description: Decreased endurance secondary to LLE pain and c/o dizziness PT Assessment Rehab Potential: Good Barriers to Discharge: Decreased caregiver support;Inaccessible home environment PT Patient demonstrates impairments in the following area(s): Balance;Behavior;Endurance;Motor;Pain;Perception;Safety;Sensory PT Transfers Functional Problem(s): Bed Mobility;Bed to Chair PT Locomotion Functional Problem(s): Ambulation;Wheelchair Mobility PT Plan PT Intensity: Minimum of 1-2 x/day ,45 to 90 minutes PT Frequency: 5 out of 7 days PT Duration Estimated Length of Stay: 10-14days PT Treatment/Interventions: Ambulation/gait training;Balance/vestibular training;Cognitive remediation/compensation;Discharge  planning;DME/adaptive equipment instruction;Functional electrical stimulation;Functional mobility training;Neuromuscular re-education;Pain management;Psychosocial support;Splinting/orthotics;Therapeutic Activities;Therapeutic Exercise;UE/LE Strength taining/ROM;UE/LE Coordination activities;Visual/perceptual remediation/compensation;Wheelchair propulsion/positioning PT Transfers Anticipated Outcome(s): Mod A PT Locomotion Anticipated Outcome(s): Mod A PT Recommendation Recommendations for Other Services: Vestibular eval Follow Up Recommendations: Skilled nursing facility Patient destination: [place] (SNF)  Skilled Therapeutic Intervention Pt seen for physical therapy evaluation,  details below. Pt received asleep in bed, requiring multiple attempts to awaken and intermittent verbal/tactile input to maintain arousal. Pt total A for bed mobility and total A +2 for transfers and ambulation. Pt c/o of constant dizziness throughout session. Pt unable to describe dizziness or accurately state if/when activities increased dizziness. No nystagmus noted but difficult to assess secondary to pt's lethargy and difficulty following commands. Pt restless and easily frustrated. Pt left seated in w/c with needs in reach and nurse tech present to supervise breakfast.    PT Evaluation Precautions/Restrictions Precautions Precautions: Fall Restrictions Weight Bearing Restrictions: No General   Vital SignsTherapy Vitals Temp: 98.6 F (37 C) Temp src: Oral Pulse Rate: 71 Resp: 17 BP: 136/101 mmHg Patient Position, if appropriate: Sitting Oxygen Therapy SpO2: 99 % O2 Device: None (Room air) Pain Pain Assessment Pain Assessment: No/denies pain Home Living/Prior Functioning Home Living Available Help at Discharge: Available PRN/intermittently Type of Home: House Home Access: Stairs to enter Technical brewer of Steps: Pt unable to recall Home Layout: One level  Lives With: Family  (Mother) Vision/Perception  Vision - History Patient Visual Report: Blurring of vision;Eye fatigue/eye pain/headache Vision - Assessment Eye Alignment: Impaired (comment) Additional Comments: R gaze preference, unable to scan beyond midline to L, Perception Perception: Impaired Inattention/Neglect: Does not attend to left visual field;Does not attend to left side of body Praxis Praxis: Impaired  Cognition Overall Cognitive Status: Impaired/Different from baseline Arousal/Alertness: Lethargic Orientation Level: Oriented to person;Oriented to place;Oriented to situation Memory: Impaired Awareness: Impaired Problem Solving: Impaired Behaviors: Restless;Impulsive;Verbal agitation;Poor frustration tolerance Safety/Judgment: Impaired Sensation Sensation Light Touch: Impaired Detail Light Touch Impaired Details: Impaired LUE;Impaired LLE (Difficult to assess 2/2 to pt cognitive function) Stereognosis: Not tested Hot/Cold: Appears Intact Proprioception: Impaired by gross assessment Coordination Gross Motor Movements are Fluid and Coordinated: No Fine Motor Movements are Fluid and Coordinated: No Finger Nose Finger Test: Decreased coordination RUE, unable to assess LUE Heel Shin Test: Decreased coordination RLE, unable to assess LLE Motor  Motor Motor: Hemiplegia;Ataxia;Motor apraxia;Abnormal postural alignment and control Motor - Skilled Clinical Observations: L hemiparesis, decreased coordination on R, delayed processing and motor planning  Mobility Bed Mobility Bed Mobility: Supine to Sit Supine to Sit: 1: +1 Total assist Transfers Transfers: Yes Sit to Stand: 1: +2 Total assist Stand to Sit: 1: +2 Total assist Stand Pivot Transfers: 1: +2 Total assist Locomotion  Ambulation Ambulation/Gait Assistance: 1: +2 Total assist Ambulation Distance (Feet): 3 Feet Assistive device: 2 person hand held assist Ambulation/Gait Assistance Details: Verbal cues for gait pattern;Manual  facilitation for weight bearing;Manual facilitation for weight shifting;Verbal cues for technique;Tactile cues for posture;Tactile cues for weight shifting Ambulation/Gait Assistance Details: Pt able to initiate advancement of LLE, requires blocking of L knee in stance to prevent buckling, tends to lean to L Gait Gait Pattern: Step-to pattern;Decreased step length - right;Decreased step length - left;Decreased stance time - right;Decreased stance time - left;Decreased stride length;Decreased hip/knee flexion - right;Decreased hip/knee flexion - left;Decreased dorsiflexion - right;Decreased dorsiflexion - left;Decreased weight shift to left;Lateral trunk lean to left;Narrow base of support Stairs / Additional Locomotion Stairs: No Wheelchair Mobility Wheelchair Mobility: Yes Wheelchair Assistance: 1: +1 Total assist Wheelchair Assistance Details: Verbal cues for technique;Verbal cues for sequencing;Verbal cues for safe use of DME/AE Wheelchair Propulsion: Right upper extremity;Right lower extremity Wheelchair Parts Management: Needs assistance Distance: 10 ft  Trunk/Postural Assessment  Cervical Assessment Cervical Assessment:  (forward head posture) Thoracic Assessment Thoracic Assessment:  (increased thoracic kyphosis) Lumbar Assessment Lumbar Assessment: Within  Functional Limits Postural Control Postural Control: Deficits on evaluation  Balance Static Sitting Balance Static Sitting - Balance Support: Feet supported Static Sitting - Level of Assistance: 4: Min assist;5: Stand by assistance Static Sitting - Comment/# of Minutes: Pt able to maintain sitting balance EOB supervision-min A, occasional lean toward L Static Standing Balance Static Standing - Level of Assistance: 1: +2 Total assist Static Standing - Comment/# of Minutes: Blocking L knee to prevent buckling, manual facilitation for upright posture and hip extension Extremity Assessment  RUE Assessment RUE Assessment: Within  Functional Limits LUE Assessment LUE Assessment: Exceptions to [facility] (2/5 wrist extension, trace otherwise) RLE Assessment RLE Assessment: Within Functional Limits LLE Assessment LLE Assessment:  (2/5 overall)  FIM:  FIM - Bed/Chair Transfer Bed/Chair Transfer: 1: Supine > Sit: Total A (helper does all/Pt. < 25%);1: Two helpers FIM - Locomotion: Wheelchair Distance: 10 ft Locomotion: Wheelchair: 1: Total Assistance/staff pushes wheelchair (Pt<25%) FIM - Locomotion: Ambulation Locomotion: Ambulation Assistive Devices:  (+2 HHA) Ambulation/Gait Assistance: 1: +2 Total assist Locomotion: Ambulation: 1: Two helpers FIM - Locomotion: Stairs Locomotion: Stairs: 0: Activity did not occur   Refer to Care Plan for Long Term Goals  Recommendations for other services: Other: Vestibular eval  Discharge Criteria: Patient will be discharged from PT if patient refuses treatment 3 consecutive times without medical reason, if treatment goals not met, if there is a change in medical status, if patient makes no progress towards goals or if patient is discharged from hospital.  The above assessment, treatment plan, treatment alternatives and goals were discussed and mutually agreed upon: by patient  [facility], [facility] 03/03/2013, 9:28 AM

## 2013-03-03 NOTE — Progress Notes (Signed)
Social Work Assessment and Plan Social Work Assessment and Plan  Patient Details  Name: Eric May MRN: [id number] Date of Birth: Apr 21, 1960  Today's Date: 03/03/2013  Problem List:  Patient Active Problem List   Diagnosis Date Noted  . ICH (intracerebral hemorrhage) 03/02/2013  . Convulsions/seizures 02/24/2013  . Intracerebral hemorrhage 02/23/2013   Past Medical History:  Past Medical History  Diagnosis Date  . Hypertension    Past Surgical History: No past surgical history on file. Social History:  reports that he has been smoking.  He does not have any smokeless tobacco history on file. He reports that he drinks alcohol. He reports that he uses illicit drugs (Marijuana).  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  He usually does what he wants.  Mom reports: " He is stubborn and hard headed."  Social History Preferred language: English Religion:  Cultural Background: No issues Education: High School Read: Yes Write: Yes Employment Status: Employed Name of Employer: Pensions consultant Return to Work Plans: Will probably not be able to Legal Hisotry/Current Legal Issues: No issues Guardian/Conservator: None-according to MD pt is not capable of making his own decisions-will look toward Mom since this is his next of kin   Abuse/Neglect Physical Abuse: Denies Verbal Abuse: Denies Sexual Abuse: Denies Exploitation of patient/patient's resources: Denies Self-Neglect: Denies  Emotional Status Pt's affect, behavior adn adjustment status: Pt has refused therapies today, saying he has had enough.  He states: " I need to rest."  Mom reports he does as he chooses  and is not used to being told what to do.  [name] expresses his head hurts and he just wants to lay in bed. Recent Psychosocial Issues: HTN did not take any medications for Pyschiatric History: No history deferred depression screen due to pt refused and unsure if he is able to complete.  Mom and [name] report no psych history Substance Abuse History: Was positive for cocaine, THC and ETOH.  [name] reports he does this recreationally but drinks and smokes daily.  Will address once pt more able to discuss  Patient / Family Perceptions, Expectations & Goals Pt/Family understanding of illness & functional limitations: Unsure if pt understands what has happened to him.  Mom and dawn are able to explain his bleed and deficits as a result.  They visit him every other day but try every day.  Both concerend he may not particiapte in therapies. Premorbid pt/family roles/activities: Son, Boyfriend, Employee, etc Anticipated changes in roles/activities/participation: Wants to resume Pt/family expectations/goals: Mom states: " I hope he does well, but want him placed in [place] I don't drive."  [name] states; " He needs to be independent to come home to his Mom who is 60 years old."  Building surveyor: None Premorbid Home Care/DME Agencies: None Transportation available at discharge: [employer] referrals recommended: Support group (specify) ([facility] Support group)  Discharge Planning Living Arrangements: Spouse/significant other;Parent Support Systems: Spouse/significant other;Parent;Friends/neighbors Type of Residence: Private residence [employer] Resources: Customer service manager Resources: Employment Surveyor, quantity Screen Referred: Yes Living Expenses: Lives with family Money Management: Patient Does the patient have any problems obtaining your medications?: Yes (Describe) (No insurance) Home Management: Mom or [name] Patient/Family Preliminary Plans: Would need to be independent to return to  [employer] home, both she and [name] are  anticipating he will go to NH from here.  Will need to begin SSD and Medicaid applications and informed both Mom and [name] that [place] is very [place] Social Work Anticipated Follow Up Needs: SNF DC Planning Additional Notes/Comments: Pt will be difficult to place due to young age and drug history along with his no insurance status.  Hopefully he will choose to particiapte in therapies.  Clinical Impression Very unfortunate gentleman who has potential if he chooses to participate.  Will try to begin SSD and Medicaid applications.  Will keep Mom and [name] updated and begin the NH process. Although pt is not aware he is NHP.  Will discuss with him.  [place] very [place] due to letter of guarantee and lack of beds in the area.  [name], [name] 03/03/2013, 3:21 PM

## 2013-03-03 NOTE — Progress Notes (Signed)
Patient information reviewed and entered into eRehab system by Abrielle Finck, RN, CRRN, PPS Coordinator.  Information including medical coding and functional independence measure will be reviewed and updated through discharge.    

## 2013-03-03 NOTE — Evaluation (Signed)
Speech Language Pathology Assessment and Plan  Patient Details  Name: Eric May MRN: [id number] Date of Birth: October 10, 1960  SLP Diagnosis: Dysarthria;Cognitive Impairments;Dysphagia  Rehab Potential: Good ELOS: 10-12 days   Today's Date: 03/03/2013 Time: 3716-9678 Time Calculation (min): 45 min  Problem List:  Patient Active Problem List   Diagnosis Date Noted  . ICH (intracerebral hemorrhage) 03/02/2013  . Convulsions/seizures 02/24/2013  . Intracerebral hemorrhage 02/23/2013   Past Medical History:  Past Medical History  Diagnosis Date  . Hypertension    Past Surgical History: No past surgical history on file.  Assessment / Plan / Recommendation Clinical Impression  Eric May is a 53 y.o. right-handed male with history of hypertension, tobacco and alcohol abuse. Admitted 02/23/2013 when patient developed left-sided numbness and weakness while at work. Patient reports falling at that time due to weakness on the left. Blood pressure 194/117. CT of the head showed acute parenchymal hemorrhage over the right temporal parietal region measuring 4.9 x 3.2 cm with local mass effect and midline shift of 4 mm. Echocardiogram with ejection fraction 65% no valvular abnormalities. Carotid Dopplers with no ICA stenosis. MRI of the brain 02/24/2013 again showed intraparenchymal hematoma 5.2 x 3.7 x 3.3 cm with surrounding vasogenic edema. Neurosurgery Dr. Kathyrn Sheriff consult advise conservative management with repeat serial cranial CT scans. Urine drug screen was positive for cocaine as well as marijuana. Maintained on Cardene drip for blood pressure control. Diet advance to dysphagia 3 textures and thin liquids 03/01/2013. Keppra for seizure prophylaxis. Physical therapy evaluation completed 02/26/2013 with recommendations for physical medicine rehabilitation consult to consider inpatient rehabilitation services. Patient was felt to be a good candidate for inpatient rehabilitation services  and was admitted for comprehensive rehabilitation program 03/02/12.  Beside Swallow and Cognitive-Linguistic Evaluations completed.  Patient demonstrates left sided oral weakness resulting in moderate dysarthria and mild dysphagia.  Patient also demonstrates severe cognitive impairments characterized by impaired sustained attention, which impacts his ability to safely care for himself at a basic level.  As a result, skilled SLP services are warranted to maximize functional independence and reduce burden of care prior to discharge.      SLP Assessment  Patient will need skilled Speech Lanaguage Pathology Services during CIR admission    Recommendations  Diet Recommendations: Dysphagia 3 (Mechanical Soft);Thin liquid Liquid Administration via: Cup;Straw Medication Administration: Crushed with puree Supervision: Staff to assist with self feeding;Full supervision/cueing for compensatory strategies Compensations: Slow rate;Small sips/bites;Check for pocketing;Check for anterior loss Postural Changes and/or Swallow Maneuvers: Seated upright 90 degrees;Out of bed for meals Oral Care Recommendations: Oral care BID Patient destination: [place] (SNF) Follow up Recommendations: 24 hour supervision/assistance;Skilled Nursing facility Equipment Recommended: None recommended by SLP    SLP Frequency 5 out of 7 days   SLP Treatment/Interventions Cognitive remediation/compensation;Cueing hierarchy;Dysphagia/aspiration precaution training;Environmental controls;Functional tasks;Internal/external aids;Patient/family education;Oral motor exercises;Therapeutic Activities    Pain Pain Assessment Pain Assessment: No/denies pain Prior Functioning Cognitive/Linguistic Baseline: Within functional limits Type of Home: House  Lives With: Family (mother ) Available Help at Discharge: Available PRN/intermittently;Family Vocation: Part time employment Patent examiner)  Short Term Goals: Week 1: SLP Short  Term Goal 1 (Week 1): Patient will sustain attention to basic self-care task for 1 minute with Max clinician cues. SLP Short Term Goal 2 (Week 1): Patient will solve basic problems with Max verbal cues. SLP Short Term Goal 3 (Week 1): Patient will label 2 physical and 1 cognitive deficit with Max questions cues. SLP Short Term Goal 4 (Week 1): Patient will  consume Dys.3 textures and thin liquids with Mod cues for safety to prevent overt s/s of aspiration.  SLP Short Term Goal 5 (Week 1): Patient will utilize speech intelligibility strategies with Max clinician cues.  See FIM for current functional status Refer to Care Plan for Long Term Goals  Recommendations for other services: None  Discharge Criteria: Patient will be discharged from SLP if patient refuses treatment 3 consecutive times without medical reason, if treatment goals not met, if there is a change in medical status, if patient makes no progress towards goals or if patient is discharged from hospital.  The above assessment, treatment plan, treatment alternatives and goals were discussed and mutually agreed upon: No family available/patient unable  Eric May., [place] 03/03/2013, 11:17 AM

## 2013-03-03 NOTE — Progress Notes (Signed)
Occupational Therapy Note  Patient Details  Name: Gerre Pebblesnthony Vidaurri MRN: [id number] Date of Birth: Oct 01, 1960 Today's Date: 03/03/2013  Unable to complete OT evaluation secondary to pt fatigue and refusal of session.  Pt asleep in bed, aroused to name but adamantly refusing participation in therapy.  With pt stating "no, no, no" getting louder when encouraged to participate. Stating "I'm telling you, I just need to rest" very emphatically with head turned away from therapist.  Spoke with RN who reports girlfriend states that this is typical as he is quite hard headed.  Plan to follow up as able.  Returned at 1130 to find pt out of room, having been taken off unit for x-rays of Lt knee and lumbar spine (per orders).  Will follow up as able.  [name], Lilymarie Scroggins 03/03/2013, 11:26 AM

## 2013-03-03 NOTE — Progress Notes (Signed)
53 y.o. right-handed male with history of hypertension, tobacco and alcohol abuse. Admitted 02/23/2013 when patient developed left-sided numbness and weakness while at work. Patient reports falling at that time due to weakness on the left. Blood pressure 194/117. CT of the head showed acute parenchymal hemorrhage over the right temporal parietal region measuring 4.9 x 3.2 cm with local mass effect and midline shift of 4 mm. Echocardiogram with ejection fraction 65% no valvular abnormalities. Carotid Dopplers with no ICA stenosis. MRI of the brain 02/24/2013 again showed intraparenchymal hematoma 5.2 x 3.7 x 3.3 cm with surrounding vasogenic edema. Neurosurgery Dr. Kathyrn Sheriff consult advise conservative management with repeat serial cranial CT scans. Urine drug screen was positive for cocaine as well as marijuana. Maintained on Cardene drip for blood pressure control.  Diet advance to dysphagia 3 thin liquids 03/01/2013. Keppra for seizure prophylaxis  Subjective/Complaints: Pt sleeping but arouses to voice, reduced alertness, does not respond to questions consistently Left knee pain "don't move knee" Friend notes pt has had pain with straight leg raise which also caused back pain Review of Systems - unable to obtain secondary to reduced level of alertness Objective: Vital Signs: Blood pressure 144/102, pulse 71, temperature 98.6 F (37 C), temperature source Oral, resp. rate 17, height 6' (1.829 m), weight 60.1 kg (132 lb 7.9 oz), SpO2 99.00%. No results found. Results for orders placed during the hospital encounter of 02/23/13 (from the past 72 hour(s))  CK TOTAL AND CKMB     Status: None   Collection Time    02/28/13 12:40 PM      Result Value Range   Total CK 36  7 - 232 U/L   CK, MB 0.7  0.3 - 4.0 ng/mL   Relative Index RELATIVE INDEX IS INVALID  0.0 - 2.5   Comment: WHEN CK < 100 U/L             TROPONIN I     Status: None   Collection Time    02/28/13 12:40 PM      Result Value Range    Troponin I <0.30  <0.30 ng/mL   Comment:            Due to the release kinetics of cTnI,     a negative result within the first hours     of the onset of symptoms does not rule out     myocardial infarction with certainty.     If myocardial infarction is still suspected,     repeat the test at appropriate intervals.  BASIC METABOLIC PANEL     Status: Abnormal   Collection Time    03/01/13  3:45 AM      Result Value Range   Sodium 141  137 - 147 mEq/L   Potassium 3.6 (*) 3.7 - 5.3 mEq/L   Chloride 99  96 - 112 mEq/L   CO2 26  19 - 32 mEq/L   Glucose, Bld 99  70 - 99 mg/dL   BUN 17  6 - 23 mg/dL   Creatinine, Ser 0.87  0.50 - 1.35 mg/dL   Calcium 10.1  8.4 - 10.5 mg/dL   GFR calc non Af Amer >90  >90 mL/min   GFR calc Af Amer >90  >90 mL/min   Comment: (NOTE)     The eGFR has been calculated using the CKD EPI equation.     This calculation has not been validated in all clinical situations.     eGFR's persistently <90 mL/min signify  possible Chronic Kidney     Disease.      Constitutional: He appears lethargic, no distress. Sleeping upon my entry  HENT: poor dentition, thrush over tongue  Head: Normocephalic.  Eyes:  Pupils EOMI, without nystagmus  Neck: Normal range of motion. Neck supple. No thyromegaly present.  Cardiovascular: Normal rate and regular rhythm. No murmurs  Respiratory: Effort normal and breath sounds normal. No respiratory distress. No wheezes, rales, rhonchi  GI: Soft. Bowel sounds are normal. He exhibits no distension.  Neurological:  Patient is lethargic but arousable. Oriented to name and year but not date or month. He did follow some simple commands. . 1/5 left shoulder but otherwise was 0/5. LLE was 1-2/5 with HF and KE and trace at the ankle. Withdraws to deep pain stimulation in both the arm and the left leg. Speech moderately dysarthric.  Skin: Skin is warm and dry.  Psychiatric:  Flat, lethargic   Assessment/Plan: 1. 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 Physiatrist is providing close team supervision and 24 hour management of active medical problems listed below. Physiatrist and rehab team continue to assess barriers to discharge/monitor patient progress toward functional and medical goals. FIM:                                  Medical Problem List and Plan:  1. 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 DVT Prophylaxis/Anticoagulation: SCDs. Monitor for any signs of DVT  3. Pain Management: Tylenol as needed  4. Neuropsych: This patient is not capable of making decisions on his own behalf.  5. Seizure prophylaxis. Keppra 500 mg twice a day. Monitor for any seizure activity  6. Hypertension. Norvasc 10 mg daily, hydrochlorothiazide 25 mg daily. Monitor with increased mobility  7. Dysphasia.[name] dysphagia 3 thin liquids. Followup speech therapy  8. History of tobacco alcohol abuse. Urine drug screen positive cocaine and marijuana. Provide counseling as appropriate  9. Thrush: 3 days of diflucan 10.  Left knee pain mainly with SLR, Did have a fall check L knee and back films  LOS (Days) 1 A FACE TO FACE EVALUATION WAS PERFORMED  Shanteria Laye E 03/03/2013, 7:00 AM

## 2013-03-03 NOTE — Evaluation (Signed)
I have read and agree with above assessment and treatment note.  Edson SnowballBecky Avneet Ashmore, PT

## 2013-03-04 ENCOUNTER — Inpatient Hospital Stay (HOSPITAL_COMMUNITY): Payer: Medicaid Other | Admitting: Speech Pathology

## 2013-03-04 ENCOUNTER — Inpatient Hospital Stay (HOSPITAL_COMMUNITY): Payer: Medicaid Other

## 2013-03-04 ENCOUNTER — Inpatient Hospital Stay (HOSPITAL_COMMUNITY): Payer: Self-pay | Admitting: Occupational Therapy

## 2013-03-04 ENCOUNTER — Inpatient Hospital Stay (HOSPITAL_COMMUNITY): Payer: Medicaid Other | Admitting: Physical Therapy

## 2013-03-04 ENCOUNTER — Inpatient Hospital Stay (HOSPITAL_COMMUNITY): Payer: Medicaid Other | Admitting: Occupational Therapy

## 2013-03-04 MED ORDER — ENSURE COMPLETE PO LIQD
237.0000 mL | Freq: Two times a day (BID) | ORAL | Status: DC
  Administered 2013-03-05 – 2013-03-07 (×3): 237 mL via ORAL

## 2013-03-04 MED ORDER — PROPRANOLOL HCL 10 MG PO TABS
10.0000 mg | ORAL_TABLET | Freq: Two times a day (BID) | ORAL | Status: DC
  Administered 2013-03-04 – 2013-03-06 (×5): 10 mg via ORAL
  Filled 2013-03-04 (×7): qty 1

## 2013-03-04 NOTE — Progress Notes (Signed)
Patient found in room with bed up in the air, all 4 side rails up, call bell out of reach, and bed not plugged into the wall.  Transporter brought patient back from MRI and failed to notify any staff member on the unit.  MRI notified and unaware of the name of the transporter.  Patient unharmed.  Will continue to monitor.

## 2013-03-04 NOTE — Progress Notes (Signed)
Physical Therapy Session Note  Patient Details  Name: Eric May MRN: [id number] Date of Birth: 1960/06/05  Today's Date: 03/04/2013 Time: 1010-1100 Time Calculation (min): 50 min  Skilled Therapeutic Interventions/Progress Updates:    Pt received lying in bed having a phone conversation. Pt initially refused session, stating "I did enough today, I'm worn out." With encouragement pt agreeable to perform LE exercise in bed. Ankle pumps, heel slides, quad sets, hip ab/adduction x10 each LE with resistance provided for RLE. After LE exercise, pt agreeable to getting out of bed. Pt performed supine>sit max A and transferred bed>w/c max A. Pt propelled w/c with hemi technique 100 ft with min A and verbal cues for technique. Pt ambulated 15 ft +2 HHA with manual facilitation for LLE advancement and blocking L knee, verbal cues for sequencing and increased step length. Pt very frustrated during ambulation trial but able to complete with encouragement. Pt left seated at RN station.   Therapy Documentation Precautions:  Precautions Precautions: Fall Restrictions Weight Bearing Restrictions: No Pain: :  Pt denied pain beginning of session, c/o of leg pain during ambulation. Rest break provided and RN notified.   See FIM for current functional status  Therapy/Group: Individual Therapy  Jaxon Mynhier 03/04/2013, 12:35 PM

## 2013-03-04 NOTE — Progress Notes (Signed)
INITIAL NUTRITION ASSESSMENT  DOCUMENTATION CODES Per approved criteria  -Severe malnutrition in the context of acute illness or injury   INTERVENTION: 1.  Supplements; Ensure Complete po BID, each supplement provides 350 kcal and 13 grams of protein 2.  MVI daily 3.  General healthful diet; encourage intake of foods and beverages as able.  RD to follow and assess for nutritional adequacy.   NUTRITION DIAGNOSIS: Inadequate oral intake related to poor appetite as evidenced by PO intake 0-25% of meals.   Monitor:  1.  Food/Beverage; pt meeting >/=90% estimated needs with tolerance. 2.  Wt/wt change; monitor trends  Reason for Assessment: Low BMI  53 y.o. male  Admitting Dx: ICH  ASSESSMENT: Pt admitted with deconditioning r/t right temporoparietal ICH. RD met with pt who is currently on Dysphagia 3 diet with thin liquids.  Pt has poor intake. Pt reports good appetite and states he is eating per his usual- "I eat when I eat."  Pt is underweight based on BMI. Pt has lost an additional 6 lbs since CVA.   Nutrition Focused Physical Exam: Subcutaneous Fat:  Orbital Region: moderate Upper Arm Region: mild-moderate Thoracic and Lumbar Region: mild-moderate  Muscle:  Temple Region: moderate Clavicle Bone Region: moderate-severe Clavicle and Acromion Bone Region: mild-moderate Scapular Bone Region: moderate Dorsal Hand: mild-moderate Patellar Region: mild-moderate Anterior Thigh Region: moderate-severe  Posterior Calf Region: moderate  Edema: none present  Pt meets criteria for severe MALNUTRITION in the context of acute as evidenced by mild-moderate wasting, wt loss since admission, and PO intake insufficient to meet 50% of estimated needs.  Height: Ht Readings from Last 1 Encounters:  03/02/13 6' (1.829 m)    Weight: Wt Readings from Last 1 Encounters:  03/02/13 132 lb 7.9 oz (60.1 kg)    Ideal Body Weight: 80.9 kg  % Ideal Body Weight: 74%  Wt Readings from Last  10 Encounters:  03/02/13 132 lb 7.9 oz (60.1 kg)  02/26/13 138 lb 3.7 oz (62.7 kg)    Usual Body Weight: 138 lbs  % Usual Body Weight: 95%  BMI:  Body mass index is 17.97 kg/(m^2).  Estimated Nutritional Needs: Kcal: 2100-2280 Protein: 90-110g Fluid: >2.1 L/day  Skin: intact  Diet Order: Dysphagia 3, thin  EDUCATION NEEDS: -Education needs addressed   Intake/Output Summary (Last 24 hours) at 03/04/13 1533 Last data filed at 03/04/13 1510  Gross per 24 hour  Intake    500 ml  Output    550 ml  Net    -50 ml    Last BM: 1/6  Labs:   Recent Labs Lab 02/27/13 0400 03/01/13 0345 03/03/13 0610  NA 139 141 140  K 3.6* 3.6* 3.8  CL 100 99 96  CO2 25 26 29   BUN 14 17 17   CREATININE 0.82 0.87 0.89  CALCIUM 10.0 10.1 10.3  GLUCOSE 104* 99 109*    CBG (last 3)  No results found for this basename: GLUCAP,  in the last 72 hours  Scheduled Meds: . amLODipine  10 mg Oral Daily  . antiseptic oral rinse  15 mL Mouth Rinse BID  . folic acid  1 mg Oral Daily  . hydrochlorothiazide  25 mg Oral Daily  . levETIRAcetam  500 mg Oral BID  . multivitamin with minerals  1 tablet Oral Daily  . potassium chloride  20 mEq Oral Daily  . propranolol  10 mg Oral BID  . senna-docusate  1 tablet Oral BID  . thiamine  100 mg Oral Daily  Continuous Infusions:   Past Medical History  Diagnosis Date  . Hypertension     No past surgical history on file.  Brynda Greathouse, MS RD LDN Clinical Inpatient Dietitian Pager: [phone] Weekend/After hours pager: [phone]

## 2013-03-04 NOTE — Progress Notes (Signed)
53 y.o. right-handed male with history of hypertension, tobacco and alcohol abuse. Admitted 02/23/2013 when patient developed left-sided numbness and weakness while at work. Patient reports falling at that time due to weakness on the left. Blood pressure 194/117. CT of the head showed acute parenchymal hemorrhage over the right temporal parietal region measuring 4.9 x 3.2 cm with local mass effect and midline shift of 4 mm. Echocardiogram with ejection fraction 65% no valvular abnormalities. Carotid Dopplers with no ICA stenosis. MRI of the brain 02/24/2013 again showed intraparenchymal hematoma 5.2 x 3.7 x 3.3 cm with surrounding vasogenic edema. Neurosurgery Dr. Kathyrn Sheriff consult advise conservative management with repeat serial cranial CT scans. Urine drug screen was positive for cocaine as well as marijuana. Maintained on Cardene drip for blood pressure control.  Diet advance to dysphagia 3 thin liquids 03/01/2013. Keppra for seizure prophylaxis  Subjective/Complaints: Pt sleeping but arouses to voice, reduced alertness, does not respond to questions consistently Left knee pain "don't move knee" Friend notes pt has had pain with straight leg raise which also caused back pain Review of Systems - unable to obtain secondary to reduced level of alertness Objective: Vital Signs: Blood pressure 150/96, pulse 75, temperature 97.6 F (36.4 C), temperature source Oral, resp. rate 18, height 6' (1.829 m), weight 60.1 kg (132 lb 7.9 oz), SpO2 100.00%. Dg Lumbar Spine 2-3 Views  03/03/2013   CLINICAL DATA:  Fall.  Pain.  EXAM: LUMBAR SPINE - 2-3 VIEW  COMPARISON:  CT, 01/08/2009  FINDINGS: No fracture. No spondylolisthesis. No degenerative changes are evident. There is a mild curvature of the upper lumbar spine, convex to the left.  Soft tissues are unremarkable.  IMPRESSION: No fracture or acute finding.   Electronically Signed   By: Lajean Manes M.D.   On: 03/03/2013 13:41   Dg Knee 1-2 Views  Left  03/03/2013   CLINICAL DATA:  Left knee pain.  Fall.  EXAM: LEFT KNEE - 1-2 VIEW  COMPARISON:  None.  FINDINGS: There is no evidence of fracture, dislocation, or joint effusion. There is no evidence of arthropathy or other focal bone abnormality. Soft tissues are unremarkable.  IMPRESSION: Negative.   Electronically Signed   By: Lajean Manes M.D.   On: 03/03/2013 13:43   Results for orders placed during the hospital encounter of 03/02/13 (from the past 72 hour(s))  CBC WITH DIFFERENTIAL     Status: None   Collection Time    03/03/13  6:10 AM      Result Value Range   WBC 8.1  4.0 - 10.5 K/uL   RBC 5.32  4.22 - 5.81 MIL/uL   Hemoglobin 16.4  13.0 - 17.0 g/dL   HCT 48.3  39.0 - 52.0 %   MCV 90.8  78.0 - 100.0 fL   MCH 30.8  26.0 - 34.0 pg   MCHC 34.0  30.0 - 36.0 g/dL   RDW 13.6  11.5 - 15.5 %   Platelets 285  150 - 400 K/uL   Neutrophils Relative % 58  43 - 77 %   Neutro Abs 4.7  1.7 - 7.7 K/uL   Lymphocytes Relative 29  12 - 46 %   Lymphs Abs 2.4  0.7 - 4.0 K/uL   Monocytes Relative 12  3 - 12 %   Monocytes Absolute 0.9  0.1 - 1.0 K/uL   Eosinophils Relative 1  0 - 5 %   Eosinophils Absolute 0.1  0.0 - 0.7 K/uL   Basophils Relative 1  0 -  1 %   Basophils Absolute 0.0  0.0 - 0.1 K/uL  COMPREHENSIVE METABOLIC PANEL     Status: Abnormal   Collection Time    03/03/13  6:10 AM      Result Value Range   Sodium 140  137 - 147 mEq/L   Potassium 3.8  3.7 - 5.3 mEq/L   Chloride 96  96 - 112 mEq/L   CO2 29  19 - 32 mEq/L   Glucose, Bld 109 (*) 70 - 99 mg/dL   BUN 17  6 - 23 mg/dL   Creatinine, Ser 0.89  0.50 - 1.35 mg/dL   Calcium 10.3  8.4 - 10.5 mg/dL   Total Protein 8.0  6.0 - 8.3 g/dL   Albumin 4.0  3.5 - 5.2 g/dL   AST 13  0 - 37 U/L   ALT 12  0 - 53 U/L   Alkaline Phosphatase 93  39 - 117 U/L   Total Bilirubin 0.5  0.3 - 1.2 mg/dL   GFR calc non Af Amer >90  >90 mL/min   GFR calc Af Amer >90  >90 mL/min   Comment: (NOTE)     The eGFR has been calculated using the CKD  EPI equation.     This calculation has not been validated in all clinical situations.     eGFR's persistently <90 mL/min signify possible Chronic Kidney     Disease.      Constitutional: He appears lethargic, no distress. Sleeping upon my entry  HENT: poor dentition, thrush over tongue  Head: Normocephalic.  Eyes:  Pupils EOMI, without nystagmus  Neck: Normal range of motion. Neck supple. No thyromegaly present.  Cardiovascular: Normal rate and regular rhythm. No murmurs  Respiratory: Effort normal and breath sounds normal. No respiratory distress. No wheezes, rales, rhonchi  GI: Soft. Bowel sounds are normal. He exhibits no distension.  Neurological:  Patient is lethargic but arousable. Oriented to name and year but not date or month. He did follow some simple commands. . 1/5 left shoulder but otherwise was 0/5. LLE was 1-2/5 with HF and KE and trace at the ankle. Withdraws to deep pain stimulation in both the arm and the left leg. Speech moderately dysarthric.  Skin: Skin is warm and dry.  Psychiatric:  Flat, lethargic   Assessment/Plan: 1. 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 Physiatrist is providing close team supervision and 24 hour management of active medical problems listed below. Physiatrist and rehab team continue to assess barriers to discharge/monitor patient progress toward functional and medical goals. FIM:             FIM - Bed/Chair Transfer Bed/Chair Transfer: 1: Supine > Sit: Total A (helper does all/Pt. < 25%);1: Two helpers  FIM - Locomotion: Wheelchair Distance: 10 ft Locomotion: Wheelchair: 1: Total Assistance/staff pushes wheelchair (Pt<25%) FIM - Locomotion: Ambulation Locomotion: Ambulation Assistive Devices:  (+2 HHA) Ambulation/Gait Assistance: 1: +2 Total assist Locomotion:  Ambulation: 1: Two helpers  Comprehension Comprehension Mode: Auditory Comprehension: 2-Understands basic 25 - 49% of the time/requires cueing 51 - 75% of the time  Expression Expression Mode: Verbal Expression: 2-Expresses basic 25 - 49% of the time/requires cueing 50 - 75% of the time. Uses single words/gestures.  Social Interaction Social Interaction: 2-Interacts appropriately 25 - 49% of time - Needs frequent redirection.  Problem Solving Problem Solving: 1-Solves basic less than  25% of the time - needs direction nearly all the time or does not effectively solve problems and may need a restraint for safety  Memory Memory: 2-Recognizes or recalls 25 - 49% of the time/requires cueing 51 - 75% of the time  Medical Problem List and Plan:  1. 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 DVT Prophylaxis/Anticoagulation: SCDs. Monitor for any signs of DVT  3. Pain Management: Tylenol as needed  4. Neuropsych: This patient is not capable of making decisions on his own behalf.  5. Seizure prophylaxis. Keppra 500 mg twice a day. Monitor for any seizure activity  6. Hypertension. Norvasc 10 mg daily, hydrochlorothiazide 25 mg daily. Monitor with increased mobility  7. Dysphasia.[name] dysphagia 3 thin liquids. Followup speech therapy  8. History of tobacco alcohol abuse. Urine drug screen positive cocaine and marijuana. Provide counseling as appropriate  9. Thrush: 3 days of diflucan 10.  Left knee pain mainly with SLR, Did have a fall check L knee and back films  LOS (Days) 2 A FACE TO FACE EVALUATION WAS PERFORMED  KIRSTEINS,ANDREW E 03/04/2013, 9:12 AM

## 2013-03-04 NOTE — Progress Notes (Signed)
I have reviewed and I agree with the following treatment note.  [name] Hall, PT, DPT  

## 2013-03-04 NOTE — IPOC Note (Addendum)
Overall Plan of Care [facility](IPOC) Patient Details Name: Eric May MRN: [id number] DOB: December 28, 1960  Admitting Diagnosis: ICH  Hospital Problems: Active Problems:   ICH (intracerebral hemorrhage)     Functional Problem List: Nursing Bladder;Bowel;Medication Management;Safety;Pain  PT Balance;Behavior;Endurance;Motor;Pain;Perception;Safety;Sensory  OT Balance;Cognition;Endurance;Motor;Pain;Perception;Safety;Sensory;Vision  SLP Cognition;Linguistic;Nutrition  TR  activity tolerance, behavior, motor,safety, cognition       Basic ADL's: OT Grooming;Bathing;Dressing;Toileting     Advanced  ADL's: OT       Transfers: PT Bed Mobility;Bed to Chair  OT Toilet;Tub/Shower     Locomotion: PT Ambulation;Wheelchair Mobility     Additional Impairments: OT Fuctional Use of Upper Extremity  SLP Swallowing;Communication;Social Cognition comprehension;expression Social Interaction;Problem Solving;Memory;Attention;Awareness  TR      Anticipated Outcomes Item Anticipated Outcome  Self Feeding mod I  Swallowing  Supervision    Basic self-care  min-mod assist  Toileting  mod assist   Bathroom Transfers mod assist  Bowel/Bladder  Min assist bowel/bladder  Transfers  Mod A  Locomotion  Mod A  Communication  Min assist  Cognition  Mod assist  Pain  Less than 3  Safety/Judgment  Min assist   Therapy Plan: PT Intensity: Minimum of 1-2 x/day ,45 to 90 minutes PT Frequency: 5 out of 7 days PT Duration Estimated Length of Stay: 10-14days OT Intensity: Minimum of 1-2 x/day, 45 to 90 minutes OT Frequency: 5 out of 7 days OT Duration/Estimated Length of Stay: 10-14 days SLP Intensity: Minumum of 1-2 x/day, 30 to 90 minutes SLP Frequency: 5 out of 7 days SLP Duration/Estimated Length of Stay: 10-12 days       Team Interventions: Nursing Interventions Bladder Management;Bowel Management;Pain Management;Medication Management;Disease Management/Prevention;Patient/Family  Education  PT interventions Ambulation/gait training;Balance/vestibular training;Cognitive remediation/compensation;Discharge planning;DME/adaptive equipment instruction;Functional electrical stimulation;Functional mobility training;Neuromuscular re-education;Pain management;Psychosocial support;Splinting/orthotics;Therapeutic Activities;Therapeutic Exercise;UE/LE Strength taining/ROM;UE/LE Coordination activities;Visual/perceptual remediation/compensation;Wheelchair propulsion/positioning  OT Interventions Balance/vestibular training;Cognitive remediation/compensation;Discharge planning;Disease mangement/prevention;DME/adaptive equipment instruction;Functional mobility training;Neuromuscular re-education;Pain management;Patient/family education;Psychosocial support;Self Care/advanced ADL retraining;Therapeutic Activities;Therapeutic Exercise;UE/LE Strength taining/ROM;UE/LE Coordination activities;Visual/perceptual remediation/compensation  SLP Interventions Cognitive remediation/compensation;Cueing hierarchy;Dysphagia/aspiration precaution training;Environmental controls;Functional tasks;Internal/external aids;Patient/family education;Oral motor exercises;Therapeutic Activities  TR Interventions  recreation/leisure participation, therapeutic activities, functional mobility, balance training, adaptive equipment use, discharge planning, psychosocial support, UE/LE strength & coordination,cognition  SW/CM Interventions Discharge Planning;Psychosocial Support;Patient/Family Education    Team Discharge Planning: Destination: PT-Skilled Nursing Facility (SNF) ,OT- Skilled Nursing Facility (SNF) , SLP-Skilled Nursing Facility (SNF) Projected Follow-up: PT-Skilled nursing facility, OT-  Skilled nursing facility;24 hour supervision/assistance, SLP-24 hour supervision/assistance;Skilled Nursing facility Projected Equipment Needs: PT- , OT- To be determined, SLP-None recommended by SLP Equipment Details: PT- ,  OT-  Patient/family involved in discharge planning: PT- Patient,  OT-Patient, SLP-Patient unable/family or caregive not available  MD ELOS: 14-16 days Medical Rehab Prognosis:  Good Assessment: 53 y.o. right-handed male with history of hypertension, tobacco and alcohol abuse. Admitted 02/23/2013 when patient developed left-sided numbness and weakness while at work. Patient reports falling at that time due to weakness on the left. Blood pressure 194/117. CT of the head showed acute parenchymal hemorrhage over the right temporal parietal region measuring 4.9 x 3.2 cm with local mass effect and midline shift of 4 mm. Echocardiogram with ejection fraction 65% no valvular abnormalities. Carotid Dopplers with no ICA stenosis. MRI of the brain 02/24/2013 again showed intraparenchymal hematoma 5.2 x 3.7 x 3.3 cm with surrounding vasogenic edema  Now requiring 24/7 Rehab RN,MD, as well as CIR level PT, OT and SLP.  Treatment team will focus on ADLs and mobility [facility]  See Team Conference Notes for weekly updates to the plan of care

## 2013-03-04 NOTE — Evaluation (Addendum)
Occupational Therapy Assessment and Plan  Patient Details  Name: Eric May MRN: [id number] Date of Birth: 1960/11/27  OT Diagnosis: abnormal posture, ataxia, cognitive deficits, disturbance of vision, hemiplegia affecting non-dominant side and muscle weakness (generalized) Rehab Potential: Rehab Potential: Fair ELOS: 10-14 days   Today's Date: 03/04/2013 Time: 9628-3662 Time Calculation (min): 40 min  Problem List:  Patient Active Problem List   Diagnosis Date Noted  . ICH (intracerebral hemorrhage) 03/02/2013  . Convulsions/seizures 02/24/2013  . Intracerebral hemorrhage 02/23/2013    Past Medical History:  Past Medical History  Diagnosis Date  . Hypertension    Past Surgical History: No past surgical history on file.  Assessment & Plan Clinical Impression: Patient is a 53 y.o. right-handed male with history of hypertension, tobacco and alcohol abuse. Admitted 02/23/2013 when patient developed left-sided numbness and weakness while at work. Patient reports falling at that time due to weakness on the left. Blood pressure 194/117. CT of the head showed acute parenchymal hemorrhage over the right temporal parietal region measuring 4.9 x 3.2 cm with local mass effect and midline shift of 4 mm. Echocardiogram with ejection fraction 65% no valvular abnormalities. Carotid Dopplers with no ICA stenosis. MRI of the brain 02/24/2013 again showed intraparenchymal hematoma 5.2 x 3.7 x 3.3 cm with surrounding vasogenic edema. Neurosurgery Dr. Kathyrn Sheriff consult advise conservative management with repeat serial cranial CT scans. Urine drug screen was positive for cocaine as well as marijuana. Maintained on Cardene drip for blood pressure control.  Diet advance to dysphagia 3 thin liquids 03/01/2013. Keppra for seizure prophylaxis. Physical therapy evaluation completed 02/26/2013 with recommendations for physical medicine rehabilitation consult to consider inpatient rehabilitation services.  Patient was felt to be a good candidate for inpatient rehabilitation services.   Patient transferred to [facility] on 03/02/2013 .    Patient currently requires total with basic self-care skills secondary to muscle weakness, impaired timing and sequencing, unbalanced muscle activation, decreased coordination and decreased motor planning, decreased visual perceptual skills, decreased visual motor skills and Rt gaze preference, decreased attention to left, decreased initiation, decreased attention, decreased awareness, decreased problem solving, decreased safety awareness, decreased memory and delayed processing and decreased sitting balance, decreased standing balance, decreased postural control and hemiplegia.  Prior to hospitalization, patient could complete ADLs with independent .  Patient will benefit from skilled intervention to decrease level of assist with basic self-care skills prior to discharge to SNF.  Anticipate patient will require 24 hour supervision and moderate physical assestance and follow up OT in SNF.  OT - End of Session Activity Tolerance: Tolerates 30+ min activity with multiple rests Endurance Deficit: Yes Endurance Deficit Description: Decreased endurance secondary to LLE pain and c/o dizziness OT Assessment Rehab Potential: Fair Barriers to Discharge: Decreased caregiver support Barriers to Discharge Comments: Pt SWK and chart review, mother reports unable to provide min assist OT Patient demonstrates impairments in the following area(s): Balance;Cognition;Endurance;Motor;Pain;Perception;Safety;Sensory;Vision OT Basic ADL's Functional Problem(s): Grooming;Bathing;Dressing;Toileting OT Transfers Functional Problem(s): Toilet;Tub/Shower OT Additional Impairment(s): Fuctional Use of Upper Extremity OT Plan OT Intensity: Minimum of 1-2 x/day, 45 to 90 minutes OT Frequency: 5 out of 7 days OT Duration/Estimated Length of Stay: 10-14 days OT Treatment/Interventions:  Balance/vestibular training;Cognitive remediation/compensation;Discharge planning;Disease mangement/prevention;DME/adaptive equipment instruction;Functional mobility training;Neuromuscular re-education;Pain management;Patient/family education;Psychosocial support;Self Care/advanced ADL retraining;Therapeutic Activities;Therapeutic Exercise;UE/LE Strength taining/ROM;UE/LE Coordination activities;Visual/perceptual remediation/compensation OT Self Feeding Anticipated Outcome(s): mod I OT Basic Self-Care Anticipated Outcome(s): min-mod assist OT Toileting Anticipated Outcome(s): mod assist OT Bathroom Transfers Anticipated Outcome(s): mod assist OT Recommendation Recommendations for Other Services:  Neuropsych consult Patient destination: [place] (SNF) Follow Up Recommendations: Skilled nursing facility;24 hour supervision/assistance Equipment Recommended: To be determined   Skilled Therapeutic Intervention OT eval initiated and ADL assessment completed at sit> stand level at sink.  Pt in bed asleep upon arrival but easily aroused and willing to participate in therapy this session.  Noted pt to have decreased initiation with bed mobility.  Stand pivot transfer max assist with pt with c/o pain in LLE in weight bearing position. Engaged in bathing and dressing at sink.  Pt with decreased initiation, difficulty with motor planning, and easily frustrated secondary to LUE weakness and decreased coordination.  Educated on hemi-dressing technique with UB and LB dressing with pt requiring max verbal and gesturing cues for sequencing. Pt insisted on wearing underwear, informed RN as he has been incontinent.  Discussed with pt need to inform staff if need to toilet. Session abbreviated secondary to meeting to with Medicaid worker.  OT Evaluation Precautions/Restrictions  Precautions Precautions: Fall Restrictions Weight Bearing Restrictions: No General   Vital Signs Therapy Vitals Pulse  Rate: 75 BP: 150/96 mmHg Patient Position, if appropriate: Lying Pain Pain Assessment Pain Assessment: Faces Faces Pain Scale: Hurts little more Pain Location: Knee Pain Orientation: Left Pain Descriptors / Indicators: Sharp;Stabbing;Tender Pain Onset: With Activity Pain Intervention(s): RN made aware;Repositioned Home Living/Prior Functioning Home Living Living Arrangements: Spouse/significant other;Parent Available Help at Discharge: Available PRN/intermittently;Family Type of Home: House Home Access: Stairs to enter [employer] of Steps: Pt unable to recall Home Layout: One level Additional Comments: Due to decreased attention, pt unable to report on varoius aspects of home setup and prior level of function. Per chart review, mother reports unable to care for pt even at min assist level.    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  Required head turns and inability to track item on Lt even with head turn Perception Perception: Impaired Inattention/Neglect: Does not attend to left visual field;Does not attend to left side of body Praxis Praxis: Impaired Praxis Impairment Details: Initiation;Perseveration  Cognition Overall Cognitive Status: Impaired/Different from baseline Arousal/Alertness: Lethargic Attention: Focused;Sustained Sustained Attention: Impaired Sustained Attention Impairment: Verbal basic;Functional basic Memory: Impaired Memory Impairment: Storage deficit;Retrieval  deficit;Decreased recall of new information;Decreased short term memory Awareness: Impaired Awareness Impairment: Intellectual impairment Problem Solving: Impaired Problem Solving Impairment: Verbal basic;Functional basic Behaviors: Restless;Impulsive;Verbal agitation;Poor frustration tolerance Safety/Judgment: Impaired Sensation Sensation Light Touch: Impaired Detail Light Touch Impaired Details: Impaired LUE;Impaired LLE (difficult to assess due to pt's cognitive function) Stereognosis: Not tested Hot/Cold: Not tested Proprioception: Impaired by gross assessment Coordination Gross Motor Movements are Fluid and Coordinated: No Fine Motor Movements are Fluid and Coordinated: No Finger Nose Finger Test: Decreased coordination RUE, unable to assess LUE Extremity/Trunk Assessment RUE Assessment RUE Assessment: Within Functional Limits LUE Assessment LUE Assessment: Exceptions to WFL LUE AROM (degrees) LUE Overall AROM Comments: Pt with shoulder flexion approx 60 degrees, abduction 60 degrees, elbow flexion/extension with functional tasks but unable to elicit during assessment, thumb opposition to first digit only LUE Strength LUE Overall Strength Comments: stength grossly 2-/5  FIM:  FIM - Grooming Grooming Steps: Wash, rinse,  dry face;Oral care, brush teeth, clean dentures Grooming: 2: Patient completes 1 of 4 or 2 of 5 steps FIM - Bathing Bathing Steps Patient Completed: Chest;Left Arm;Abdomen;Front perineal area;Right upper leg;Left upper leg Bathing: 3: Mod-Patient completes 5-7 36f10 parts or 50-74% FIM - Upper Body Dressing/Undressing Upper body dressing/undressing steps patient completed: Thread/unthread right sleeve of pullover shirt/dresss;Put head through opening of pull over shirt/dress Upper body dressing/undressing: 3: Mod-Patient completed 50-74% of tasks FIM - Lower Body Dressing/Undressing Lower body dressing/undressing: 1: Total-Patient completed less than 25% of  tasks FIM - Bed/Chair Transfer Bed/Chair Transfer: 2: Bed > Chair or W/C: Max A (lift and lower assist);2: Supine > Sit: Max A (lifting assist/Pt. 25-49%) FIM - TAir cabin crewTransfers: 0-Activity did not occur FIM - Tub/Shower Transfers Tub/shower Transfers: 0-Activity did not occur or was simulated   Refer to Care Plan for Long Term Goals  Recommendations for other services: Neuropsych  Discharge Criteria: Patient will be discharged from OT if patient refuses treatment 3 consecutive times without medical reason, if treatment goals not met, if there is a change in medical status, if patient makes no progress towards goals or if patient is discharged from hospital.  The above assessment, treatment plan, treatment alternatives and goals were discussed and mutually agreed upon: by patient  HSimonne Come1/10/2013, 9:49 AM

## 2013-03-04 NOTE — Progress Notes (Signed)
Occupational Therapy Note  Patient Details  Name: Eric May MRN: [id number] Date of Birth: [birth year] Today's Date: 03/04/2013  Pt missed 30 minute skilled OT treatment session this PM. Pt asleep in bed, aroused to name but refusing therapy with pt reporting "I already got up today" and "I just need to be alone". Notified RN who reported that he refused lunch today and was agitated earlier.  Will follow up as able.  [name], Rigdon Macomber 03/04/2013, 2:06 PM

## 2013-03-04 NOTE — Progress Notes (Signed)
Speech Language Pathology Daily Session Note  Patient Details  Name: Eric May MRN: [id number] Date of Birth: 09-29-1960  Today's Date: 03/04/2013 Time: 1200-1215 Time Calculation (min): 15 min  Short Term Goals: Week 1: SLP Short Term Goal 1 (Week 1): Patient will sustain attention to basic self-care task for 1 minute with Max clinician cues. SLP Short Term Goal 2 (Week 1): Patient will solve basic problems with Max verbal cues. SLP Short Term Goal 3 (Week 1): Patient will label 2 physical and 1 cognitive deficit with Max questions cues. SLP Short Term Goal 4 (Week 1): Patient will consume Dys.3 textures and thin liquids with Mod cues for safety to prevent overt s/s of aspiration.  SLP Short Term Goal 5 (Week 1): Patient will utilize speech intelligibility strategies with Max clinician cues.  Skilled Therapeutic Interventions: Skilled treatment session focused on addressing cognition.  Patient initially refused therapy session but agreed to rest for frist 30 minutes of session and then work with SLP for second thirty minutes.  Upon SLP returning to room patient was unable to recall "deal" and refused SLP again.  Phone rang and patient required Max cues to problem solve use of phone.  During phone call patient often repeated the caller and had limited spontaneous verbal expressions.  SLP sat patient up in bed and set up lunch tray patient consumed on bite after SLP handed it to him and then refused all other p.o.  Patient stated that he was uncomfortable sitting up and required Total assist to problem solve lowering head of bed.  Session was then ended early.    FIM:  Comprehension Comprehension Mode: Auditory Comprehension: 2-Understands basic 25 - 49% of the time/requires cueing 51 - 75% of the time Expression Expression Mode: Verbal Expression: 2-Expresses basic 25 - 49% of the time/requires cueing 50 - 75% of the time. Uses single words/gestures. Social Interaction Social  Interaction: 2-Interacts appropriately 25 - 49% of time - Needs frequent redirection. Problem Solving Problem Solving: 1-Solves basic less than 25% of the time - needs direction nearly all the time or does not effectively solve problems and may need a restraint for safety Memory Memory: 2-Recognizes or recalls 25 - 49% of the time/requires cueing 51 - 75% of the time FIM - Eating Eating Activity: 4: Helper checks for pocketed food;4: Help with picking up utensils;4: Help with managing cup/glass;4: Helper occasionally scoops food on utensil  Pain Pain Assessment Pain Assessment: No/denies pain  Therapy/Group: Individual Therapy  Charlane FerrettiMelissa Bea Duren, M.A., CCC-SLP [phone]  Tremayne Sheldon 03/04/2013, 12:34 PM

## 2013-03-04 NOTE — Progress Notes (Signed)
Social Work Patient ID: Eric May, male   DOB: 1960-06-08, 53 y.o.   MRN: [id number] Spoke with Medicaid-Teresa who was taking the application with pt, Mom and [name] are not here to assist with this application. Rosey Batheresa reports there is a food stamp application in the system so some of the information is in.  She will do her best and contact Mom if further information is needed.

## 2013-03-04 NOTE — Progress Notes (Addendum)
53 y.o. right-handed male with history of hypertension, tobacco and alcohol abuse. Admitted 02/23/2013 when patient developed left-sided numbness and weakness while at work. Patient reports falling at that time due to weakness on the left. Blood pressure 194/117. CT of the head showed acute parenchymal hemorrhage over the right temporal parietal region measuring 4.9 x 3.2 cm with local mass effect and midline shift of 4 mm. Echocardiogram with ejection fraction 65% no valvular abnormalities. Carotid Dopplers with no ICA stenosis. MRI of the brain 02/24/2013 again showed intraparenchymal hematoma 5.2 x 3.7 x 3.3 cm with surrounding vasogenic edema. Neurosurgery Dr. Kathyrn Sheriff consult advise conservative management with repeat serial cranial CT scans. Urine drug screen was positive for cocaine as well as marijuana. Maintained on Cardene drip for blood pressure control.  Diet advance to dysphagia 3 thin liquids 03/01/2013. Keppra for seizure prophylaxis  Subjective/Complaints: Pt sleeping but arouses to voice, reduced alertness, does not respond to questions consistently Left knee pain "don't move knee" Reviewed Xray results with pt PT reported refusal of pm therapy with agitation Review of Systems - unable to obtain secondary to reduced level of alertness Objective: Vital Signs: Blood pressure 150/96, pulse 75, temperature 97.6 F (36.4 C), temperature source Oral, resp. rate 18, height 6' (1.829 m), weight 60.1 kg (132 lb 7.9 oz), SpO2 100.00%. Dg Lumbar Spine 2-3 Views  03/03/2013   CLINICAL DATA:  Fall.  Pain.  EXAM: LUMBAR SPINE - 2-3 VIEW  COMPARISON:  CT, 01/08/2009  FINDINGS: No fracture. No spondylolisthesis. No degenerative changes are evident. There is a mild curvature of the upper lumbar spine, convex to the left.  Soft tissues are unremarkable.  IMPRESSION: No fracture or acute finding.   Electronically Signed   By: Lajean Manes M.D.   On: 03/03/2013 13:41   Dg Knee 1-2 Views  Left  03/03/2013   CLINICAL DATA:  Left knee pain.  Fall.  EXAM: LEFT KNEE - 1-2 VIEW  COMPARISON:  None.  FINDINGS: There is no evidence of fracture, dislocation, or joint effusion. There is no evidence of arthropathy or other focal bone abnormality. Soft tissues are unremarkable.  IMPRESSION: Negative.   Electronically Signed   By: Lajean Manes M.D.   On: 03/03/2013 13:43   Results for orders placed during the hospital encounter of 03/02/13 (from the past 72 hour(s))  CBC WITH DIFFERENTIAL     Status: None   Collection Time    03/03/13  6:10 AM      Result Value Range   WBC 8.1  4.0 - 10.5 K/uL   RBC 5.32  4.22 - 5.81 MIL/uL   Hemoglobin 16.4  13.0 - 17.0 g/dL   HCT 48.3  39.0 - 52.0 %   MCV 90.8  78.0 - 100.0 fL   MCH 30.8  26.0 - 34.0 pg   MCHC 34.0  30.0 - 36.0 g/dL   RDW 13.6  11.5 - 15.5 %   Platelets 285  150 - 400 K/uL   Neutrophils Relative % 58  43 - 77 %   Neutro Abs 4.7  1.7 - 7.7 K/uL   Lymphocytes Relative 29  12 - 46 %   Lymphs Abs 2.4  0.7 - 4.0 K/uL   Monocytes Relative 12  3 - 12 %   Monocytes Absolute 0.9  0.1 - 1.0 K/uL   Eosinophils Relative 1  0 - 5 %   Eosinophils Absolute 0.1  0.0 - 0.7 K/uL   Basophils Relative 1  0 - 1 %  Basophils Absolute 0.0  0.0 - 0.1 K/uL  COMPREHENSIVE METABOLIC PANEL     Status: Abnormal   Collection Time    03/03/13  6:10 AM      Result Value Range   Sodium 140  137 - 147 mEq/L   Potassium 3.8  3.7 - 5.3 mEq/L   Chloride 96  96 - 112 mEq/L   CO2 29  19 - 32 mEq/L   Glucose, Bld 109 (*) 70 - 99 mg/dL   BUN 17  6 - 23 mg/dL   Creatinine, Ser 0.89  0.50 - 1.35 mg/dL   Calcium 10.3  8.4 - 10.5 mg/dL   Total Protein 8.0  6.0 - 8.3 g/dL   Albumin 4.0  3.5 - 5.2 g/dL   AST 13  0 - 37 U/L   ALT 12  0 - 53 U/L   Alkaline Phosphatase 93  39 - 117 U/L   Total Bilirubin 0.5  0.3 - 1.2 mg/dL   GFR calc non Af Amer >90  >90 mL/min   GFR calc Af Amer >90  >90 mL/min   Comment: (NOTE)     The eGFR has been calculated using the CKD  EPI equation.     This calculation has not been validated in all clinical situations.     eGFR's persistently <90 mL/min signify possible Chronic Kidney     Disease.      Constitutional: He appears lethargic, no distress. Sleeping upon my entry  HENT: poor dentition, thrush over tongue  Head: Normocephalic.  Eyes:  Pupils EOMI, without nystagmus  Neck: Normal range of motion. Neck supple. No thyromegaly present.  Cardiovascular: Normal rate and regular rhythm. No murmurs  Respiratory: Effort normal and breath sounds normal. No respiratory distress. No wheezes, rales, rhonchi  GI: Soft. Bowel sounds are normal. He exhibits no distension.  Neurological:  Patient is lethargic but arousable. Oriented to name and year but not date or month. He did follow some simple commands. . 1/5 left shoulder but otherwise was 0/5. LLE was 1-2/5 with HF and KE and trace at the ankle. Withdraws to deep pain stimulation in both the arm and the left leg. Speech moderately dysarthric.  Skin: Skin is warm and dry.  Psychiatric:  Flat, lethargic MSK:  + SLR on left only  Assessment/Plan: 1. 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 Physiatrist is providing close team supervision and 24 hour management of active medical problems listed below. Physiatrist and rehab team continue to assess barriers to discharge/monitor patient progress toward functional and medical goals. FIM: FIM - Bathing Bathing Steps Patient Completed: Chest;Left Arm;Abdomen;Front perineal area;Right upper leg;Left upper leg Bathing: 3: Mod-Patient completes 5-7 34f10 parts or 50-74%  FIM - Upper Body Dressing/Undressing Upper body dressing/undressing steps patient completed: Thread/unthread right sleeve of pullover shirt/dresss;Put head through opening of pull over  shirt/dress Upper body dressing/undressing: 3: Mod-Patient completed 50-74% of tasks FIM - Lower Body Dressing/Undressing Lower body dressing/undressing: 1: Total-Patient completed less than 25% of tasks     FIM - TAir cabin crewTransfers: 0-Activity did not occur  FIM - BIT sales professionalTransfer: 2: Bed > Chair or W/C: Max A (lift and lower assist);2: Supine > Sit: Max A (lifting assist/Pt. 25-49%)  FIM - Locomotion: Wheelchair Distance: 10 ft Locomotion: Wheelchair: 1: Total Assistance/staff pushes wheelchair (Pt<25%) FIM - Locomotion: Ambulation Locomotion:  Ambulation Assistive Devices:  (+2 HHA) Ambulation/Gait Assistance: 1: +2 Total assist Locomotion: Ambulation: 1: Two helpers  Comprehension Comprehension Mode: Auditory Comprehension: 2-Understands basic 25 - 49% of the time/requires cueing 51 - 75% of the time  Expression Expression Mode: Verbal Expression: 2-Expresses basic 25 - 49% of the time/requires cueing 50 - 75% of the time. Uses single words/gestures.  Social Interaction Social Interaction: 2-Interacts appropriately 25 - 49% of time - Needs frequent redirection.  Problem Solving Problem Solving: 1-Solves basic less than 25% of the time - needs direction nearly all the time or does not effectively solve problems and may need a restraint for safety  Memory Memory: 2-Recognizes or recalls 25 - 49% of the time/requires cueing 51 - 75% of the time  Medical Problem List and Plan:  1. 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 DVT Prophylaxis/Anticoagulation: SCDs. Monitor for any signs of DVT  3. Pain Management: Tylenol as needed  4. Neuropsych: This patient is not capable of making decisions on his own behalf.  5. Seizure prophylaxis. Keppra 500 mg twice a day. Monitor for any seizure activity  6. Hypertension.  Norvasc 10 mg daily, hydrochlorothiazide 25 mg daily. Monitor with increased mobility  7. Dysphasia.[name] dysphagia 3 thin liquids. Followup speech therapy  8. History of tobacco alcohol abuse. Urine drug screen positive cocaine and marijuana. Provide counseling as appropriate  9. Thrush: 3 days of diflucan 10.  Left knee pain mainly with SLR, Did have a fall  L knee and back films unremarkable will check MRI lumbar   LOS (Days) 2 A FACE TO FACE EVALUATION WAS PERFORMED  KIRSTEINS,ANDREW E 03/04/2013, 10:01 AM

## 2013-03-05 ENCOUNTER — Inpatient Hospital Stay (HOSPITAL_COMMUNITY): Payer: Medicaid Other | Admitting: Speech Pathology

## 2013-03-05 ENCOUNTER — Inpatient Hospital Stay (HOSPITAL_COMMUNITY): Payer: Medicaid Other | Admitting: Physical Therapy

## 2013-03-05 ENCOUNTER — Encounter (HOSPITAL_COMMUNITY): Payer: Self-pay | Admitting: Occupational Therapy

## 2013-03-05 DIAGNOSIS — R569 Unspecified convulsions: Secondary | ICD-10-CM

## 2013-03-05 DIAGNOSIS — I619 Nontraumatic intracerebral hemorrhage, unspecified: Secondary | ICD-10-CM

## 2013-03-05 NOTE — Progress Notes (Signed)
Speech Language Pathology Daily Session Note  Patient Details  Name: Eric May MRN: [id number] Date of Birth: Jun 27, 1960  Today's Date: 03/05/2013 Time: 1130-1205 Time Calculation (min): 35 min  Short Term Goals: Week 1: SLP Short Term Goal 1 (Week 1): Patient will sustain attention to basic self-care task for 1 minute with Max clinician cues. SLP Short Term Goal 2 (Week 1): Patient will solve basic problems with Max verbal cues. SLP Short Term Goal 3 (Week 1): Patient will label 2 physical and 1 cognitive deficit with Max questions cues. SLP Short Term Goal 4 (Week 1): Patient will consume Dys.3 textures and thin liquids with Mod cues for safety to prevent overt s/s of aspiration.  SLP Short Term Goal 5 (Week 1): Patient will utilize speech intelligibility strategies with Max clinician cues.  Skilled Therapeutic Interventions: Treatment focus on cognitive goals. SLP facilitated session by providing extra time and Max verbal cues for initiation and sequencing for transfer from bed to wheelchair for lunch. Pt also independently requested sprite with meal. Pt consumed Dys. 3 textures with thin liquids without overt s/s of aspiration but required encouragement to eat. Pt verbalized he "likes to take his time" and has always been "a slow eater." Pt then requested to use the bathroom and was transferred with +2 assistance for safety. Pt then transferred back to wheelchair to finish lunch meal with NT. Pt demonstrated emergent awareness into decreased frustration tolerance and need for increased participation in therapy. Continue plan of care.    FIM:  Comprehension Comprehension Mode: Auditory Comprehension: 4-Understands basic 75 - 89% of the time/requires cueing 10 - 24% of the time Expression Expression Mode: Verbal Expression: 3-Expresses basic 50 - 74% of the time/requires cueing 25 - 50% of the time. Needs to repeat parts of sentences. Social Interaction Social Interaction:  4-Interacts appropriately 75 - 89% of the time - Needs redirection for appropriate language or to initiate interaction. Problem Solving Problem Solving: 1-Solves basic less than 25% of the time - needs direction nearly all the time or does not effectively solve problems and may need a restraint for safety Memory Memory: 2-Recognizes or recalls 25 - 49% of the time/requires cueing 51 - 75% of the time FIM - Eating Eating Activity: 0: Activity did not occur  Pain Pain Assessment Pain Assessment: 0-10 Pain Score: Asleep Pain Type: Acute pain Pain Location: Leg Pain Orientation: Left Pain Descriptors / Indicators: Sore Pain Frequency: Intermittent Pain Intervention(s): Medication (See eMAR)  Therapy/Group: Individual Therapy  Eric May 03/05/2013, 3:14 PM

## 2013-03-05 NOTE — Progress Notes (Signed)
Patient ID: Eric May, male   DOB: [birth year], 53 y.o.   MRN: [id number]  03/05/13. 53 y.o. right-handed male. Admitted 02/23/2013 when patient developed left-sided numbness and weakness while at work. Patient reports falling at that time due to weakness on the left. Blood pressure 194/117. CT of the head showed acute parenchymal hemorrhage over the right temporal parietal region measuring 4.9 x 3.2 cm with local mass effect and midline shift of 4 mm. Echocardiogram with ejection fraction 65% no valvular abnormalities. Carotid Dopplers with no ICA stenosis. MRI of the brain 02/24/2013 again showed intraparenchymal hematoma 5.2 x 3.7 x 3.3 cm with surrounding vasogenic edema. Neurosurgery Dr. Kathyrn Sheriff consult advise conservative management with repeat serial cranial CT scans. Urine drug screen was positive for cocaine as well as marijuana. Maintained on Cardene drip for blood pressure control.  Diet advance to dysphagia 3 thin liquids 03/01/2013. Keppra for seizure prophylaxis  Subjective/Complaints: Still c/o L leg pain;  L knee x rays and lumber MRI reviewed   Objective: Vital Signs: Blood pressure 140/92, pulse 70, temperature 98.5 F (36.9 C), temperature source Oral, resp. rate 18, height 6' (1.829 m), weight 60.1 kg (132 lb 7.9 oz), SpO2 100.00%. Dg Lumbar Spine 2-3 Views  03/03/2013   CLINICAL DATA:  Fall.  Pain.  EXAM: LUMBAR SPINE - 2-3 VIEW  COMPARISON:  CT, 01/08/2009  FINDINGS: No fracture. No spondylolisthesis. No degenerative changes are evident. There is a mild curvature of the upper lumbar spine, convex to the left.  Soft tissues are unremarkable.  IMPRESSION: No fracture or acute finding.   Electronically Signed   By: Lajean Manes M.D.   On: 03/03/2013 13:41   Dg Knee 1-2 Views Left  03/03/2013   CLINICAL DATA:  Left knee pain.  Fall.  EXAM: LEFT KNEE - 1-2 VIEW  COMPARISON:  None.  FINDINGS: There is no evidence of fracture, dislocation, or joint effusion. There is no evidence of  arthropathy or other focal bone abnormality. Soft tissues are unremarkable.  IMPRESSION: Negative.   Electronically Signed   By: Lajean Manes M.D.   On: 03/03/2013 13:43   Mr Lumbar Spine Wo Contrast  03/04/2013   CLINICAL DATA:  Back pain and left lower extremity pain.  EXAM: MRI LUMBAR SPINE WITHOUT CONTRAST  TECHNIQUE: Multiplanar, multisequence MR imaging was performed. No intravenous contrast was administered.  COMPARISON:  Radiography 03/03/2013.  FINDINGS: There is curvature convex to the left with the apex at L3. The intervertebral discs are normal without degeneration, bulge or herniation. There is no evidence of fracture. No facet arthropathy. No pars defects. Wide patency of the canal and foramina. The distal cord and conus are normal.  There is a fluid level in the tip of the thecal sac. Most commonly, this would be due to some hemorrhage into the spinal fluid, but I do not see the site of origin. The patient is known have intracranial hemorrhage with intraventricular extension. There is no contained hematoma in the spinal region. The differential diagnosis also includes layering proteinaceous material or white cells, but the history does not suggest fulminant meningitis.  IMPRESSION: No disc abnormality.  Wide patency of the canal and foramina.  Fluid level at the tip of the thecal sac usually indicating intrathecal hemorrhage. This patient has a known intracranial hemorrhage with intraventricular penetration so this is not surprising.   Electronically Signed   By: Nelson Chimes M.D.   On: 03/04/2013 18:50   Results for orders placed during the hospital encounter of  03/02/13 (from the past 72 hour(s))  CBC WITH DIFFERENTIAL     Status: None   Collection Time    03/03/13  6:10 AM      Result Value Range   WBC 8.1  4.0 - 10.5 K/uL   RBC 5.32  4.22 - 5.81 MIL/uL   Hemoglobin 16.4  13.0 - 17.0 g/dL   HCT 48.3  39.0 - 52.0 %   MCV 90.8  78.0 - 100.0 fL   MCH 30.8  26.0 - 34.0 pg   MCHC 34.0   30.0 - 36.0 g/dL   RDW 13.6  11.5 - 15.5 %   Platelets 285  150 - 400 K/uL   Neutrophils Relative % 58  43 - 77 %   Neutro Abs 4.7  1.7 - 7.7 K/uL   Lymphocytes Relative 29  12 - 46 %   Lymphs Abs 2.4  0.7 - 4.0 K/uL   Monocytes Relative 12  3 - 12 %   Monocytes Absolute 0.9  0.1 - 1.0 K/uL   Eosinophils Relative 1  0 - 5 %   Eosinophils Absolute 0.1  0.0 - 0.7 K/uL   Basophils Relative 1  0 - 1 %   Basophils Absolute 0.0  0.0 - 0.1 K/uL  COMPREHENSIVE METABOLIC PANEL     Status: Abnormal   Collection Time    03/03/13  6:10 AM      Result Value Range   Sodium 140  137 - 147 mEq/L   Potassium 3.8  3.7 - 5.3 mEq/L   Chloride 96  96 - 112 mEq/L   CO2 29  19 - 32 mEq/L   Glucose, Bld 109 (*) 70 - 99 mg/dL   BUN 17  6 - 23 mg/dL   Creatinine, Ser 0.89  0.50 - 1.35 mg/dL   Calcium 10.3  8.4 - 10.5 mg/dL   Total Protein 8.0  6.0 - 8.3 g/dL   Albumin 4.0  3.5 - 5.2 g/dL   AST 13  0 - 37 U/L   ALT 12  0 - 53 U/L   Alkaline Phosphatase 93  39 - 117 U/L   Total Bilirubin 0.5  0.3 - 1.2 mg/dL   GFR calc non Af Amer >90  >90 mL/min   GFR calc Af Amer >90  >90 mL/min   Comment: (NOTE)     The eGFR has been calculated using the CKD EPI equation.     This calculation has not been validated in all clinical situations.     eGFR's persistently <90 mL/min signify possible Chronic Kidney     Disease.      Constitutional: , no distress.  HENT: poor dentition,  Head: Normocephalic.  Eyes:  Pupils EOMI, without nystagmus  Neck: Normal range of motion. Neck supple. No thyromegaly present.  Cardiovascular: Normal rate and regular rhythm. No murmurs  Respiratory: Effort normal and breath sounds normal. No respiratory distress. No wheezes, rales, rhonchi  GI: Soft. Bowel sounds are normal. He exhibits no distension.  Neurological:  Alert with L sided weakness Skin: Skin is warm and dry.  Extremities-no edema   Assessment/Plan: 1. 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 2. Seizure prophylaxis. Keppra 500 mg twice a day. Monitor for any seizure activity  3. Hypertension. Norvasc 10 mg daily, hydrochlorothiazide 25 mg daily. Monitor with increased mobility  4. Dysphasia.[name]  dysphagia 3 thin liquids. Followup speech therapy    LOS (Days) 3 A FACE TO FACE EVALUATION WAS PERFORMED  Nyoka Cowden 03/05/2013, 9:51 AM

## 2013-03-05 NOTE — Progress Notes (Signed)
Occupational Therapy Note  Patient Details  Name: Eric May MRN: [id number] Date of Birth: 23-Sep-1960 Today's Date: 03/05/2013  Time In:  0830  Time Out: 0930.  Individual session, patient complained of pain in LLE especially in weight bearing.  Noted bony knot on lateral side of leg right below his knee. MD and RN aware.  ADL retraining at sink level with focus on bed mobility, bed to wheelchair transfers, following two step commands, sit to stand, standing balance, safety, sequencing, problem solving, organization, initiation.  Patient at times would start to become frustrated but it appeared this was often because the patient wasn't sure exactly what to do.  Re-clarification, re-direction and encouragement appeared to help in this session but may not always be effective.  Patient very willing to work in therapy today.  See FIM for status.  Norton Pastelulaski, Karen Halliday 03/05/2013, 12:59 PM

## 2013-03-05 NOTE — Progress Notes (Signed)
Physical Therapy Session Note  Patient Details  Name: Eric May MRN: [id number] Date of Birth: 31-Jul-1960  Today's Date: 03/05/2013 Time: [id number] Time Calculation (min): 45 min  Therapy Documentation Precautions:  Precautions: Fall Restrictions Weight Bearing Restrictions: No Pain: Pain Assessment: 0-10 Pain Score: 9 with weight bearing Pain Type: Acute pain Pain Location: Leg Pain Orientation: Left Pain Descriptors / Indicators: Sore Pain Frequency: Intermittent Pain Intervention(s): Medication (See eMAR)  Therapeutic Activity:(30') Transfer training w/c<->bed with min-assist and guidance/instructions for limited (TTWB) weightbearing on L LE due to c/o pain, bed mobility with Mod-Assist Therapeutic Exercise:(15') B LE's in supine and in sitting ( L LE AAROM and pain-free)  Therapy/Group: Individual Therapy  [name] J 03/05/2013, 1:50 PM

## 2013-03-05 NOTE — Progress Notes (Signed)
Physical Therapy Session Note  Patient Details  Name: Eric May MRN: [id number] Date of Birth: 12-15-1960  Today's Date: 03/05/2013 Time: 0830-0930 Time Calculation (min): 60 min   Therapy Documentation Precautions:  Precautions Precautions: Fall Restrictions Weight Bearing Restrictions: No Pain: Left Leg "a lot of pain" but unable to rate and only responds to pain in L LE when knee extended with hip flexed but no pain with supine passive hyperextension   OT reported to PT prior to tx session that MD to have imaging studies ordered for L LE to r/o any fx's.   Homan's Test; Negative on L   Lachman's, Varus, Valgus tests: Negative on L knee   Pelvic compression test; Negative    Thigh Thrust;(gentle) is Negative on L   Palp; Negative throughout L LE including ankle  Therapeutic Activity:(30') Transfers sit<->stand with S/min-assist, HHA w/c->bed via stand pivot transfer with patient responding in severe pain with any attempt at weight bearing on L LE.  Bed mobility sit to supine with Max-assist for B LE's into bed. [facility] verbal and tactile cues for R hand placement and grip for assist on bed rails and able to assist with min/Mod-assist for Left side from therapist.  Scooting in supine to the right with min-assist and verbal cues for sequencing.  Therapeutic Exercise:(30') B LE exercises performed painfree in sitting and in supine. L LE with AAROM and no c/o pain except with any neural stretch on L.  Difficult to assess pain generator on L LE due to Left inattention.   Therapy/Group: Individual Therapy  Olly Shiner J 03/05/2013, 8:51 AM

## 2013-03-06 ENCOUNTER — Inpatient Hospital Stay (HOSPITAL_COMMUNITY): Payer: Medicaid Other | Admitting: Physical Therapy

## 2013-03-06 MED ORDER — PROPRANOLOL HCL 20 MG PO TABS
20.0000 mg | ORAL_TABLET | Freq: Two times a day (BID) | ORAL | Status: DC
  Administered 2013-03-06 – 2013-03-07 (×2): 20 mg via ORAL
  Filled 2013-03-06 (×4): qty 1

## 2013-03-06 MED ORDER — HYDROCODONE-ACETAMINOPHEN 5-325 MG PO TABS
1.0000 | ORAL_TABLET | Freq: Four times a day (QID) | ORAL | Status: DC | PRN
  Administered 2013-03-06 – 2013-03-10 (×8): 1 via ORAL
  Filled 2013-03-06 (×9): qty 1

## 2013-03-06 NOTE — Progress Notes (Signed)
Patient ID: Eric May, male   DOB: 03-Jan-1961, 53 y.o.   MRN: [id number]  Patient ID: Eric May, male   DOB: 27-Apr-1960, 53 y.o.   MRN: [id number]  03/06/13. 53 y.o. right-handed male. Admitted 02/23/2013 when patient developed left-sided numbness and weakness while at work. Patient reports falling at that time due to weakness on the left. Blood pressure 194/117. CT of the head showed acute parenchymal hemorrhage over the right temporal parietal region measuring 4.9 x 3.2 cm with local mass effect and midline shift of 4 mm. Echocardiogram with ejection fraction 65% no valvular abnormalities. Carotid Dopplers with no ICA stenosis. MRI of the brain 02/24/2013 again showed intraparenchymal hematoma 5.2 x 3.7 x 3.3 cm with surrounding vasogenic edema. Neurosurgery Dr. Conchita Paris consult advise conservative management with repeat serial cranial CT scans. Urine drug screen was positive for cocaine as well as marijuana. Maintained on Cardene drip for blood pressure control.  Diet advance to dysphagia 3 thin liquids 03/01/2013. Keppra for seizure prophylaxis  Subjective/Complaints: Still c/o L leg pain;  L knee x rays and lumber MRI reviewed.  Also c/o headache and L shoulder pain; has been on tylenol only which he states is inadequate  Past Medical History  Diagnosis Date  . Hypertension    Patient Vitals for the past 24 hrs:  BP Temp Temp src Pulse Resp SpO2  03/06/13 0648 147/104 mmHg 97 F (36.1 C) Axillary 77 18 97 %  03/05/13 2024 144/108 mmHg - - 81 - -  03/05/13 1640 137/105 mmHg 98 F (36.7 C) Oral 88 18 98 %    Intake/Output Summary (Last 24 hours) at 03/06/13 0915 Last data filed at 03/06/13 0648  Gross per 24 hour  Intake    120 ml  Output    250 ml  Net   -130 ml       Objective: Vital Signs: Blood pressure 147/104, pulse 77, temperature 97 F (36.1 C), temperature source Axillary, resp. rate 18, height 6' (1.829 m), weight 60.1 kg (132 lb 7.9 oz), SpO2 97.00%. Mr  Lumbar Spine Wo Contrast  03/04/2013   CLINICAL DATA:  Back pain and left lower extremity pain.  EXAM: MRI LUMBAR SPINE WITHOUT CONTRAST  TECHNIQUE: Multiplanar, multisequence MR imaging was performed. No intravenous contrast was administered.  COMPARISON:  Radiography 03/03/2013.  FINDINGS: There is curvature convex to the left with the apex at L3. The intervertebral discs are normal without degeneration, bulge or herniation. There is no evidence of fracture. No facet arthropathy. No pars defects. Wide patency of the canal and foramina. The distal cord and conus are normal.  There is a fluid level in the tip of the thecal sac. Most commonly, this would be due to some hemorrhage into the spinal fluid, but I do not see the site of origin. The patient is known have intracranial hemorrhage with intraventricular extension. There is no contained hematoma in the spinal region. The differential diagnosis also includes layering proteinaceous material or white cells, but the history does not suggest fulminant meningitis.  IMPRESSION: No disc abnormality.  Wide patency of the canal and foramina.  Fluid level at the tip of the thecal sac usually indicating intrathecal hemorrhage. This patient has a known intracranial hemorrhage with intraventricular penetration so this is not surprising.   Electronically Signed   By: Paulina Fusi M.D.   On: 03/04/2013 18:50   No results found for this or any previous visit (from the past 72 hour(s)).    Constitutional: , no distress.  HENT: poor dentition,  Head: Normocephalic.  Eyes:  Pupils EOMI, without nystagmus  Neck: Normal range of motion. Neck supple. No thyromegaly present.  Cardiovascular: Normal rate and regular rhythm. No murmurs  Respiratory: Effort normal and breath sounds normal. No respiratory distress. No wheezes, rales, rhonchi  GI: Soft. Bowel sounds are normal. He exhibits no distension.  Neurological:  Alert with L sided weakness Skin: Skin is warm and dry.   Extremities-no edema   Assessment/Plan: 1. 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 2. Seizure prophylaxis. Keppra 500 mg twice a day. Monitor for any seizure activity  3. Hypertension. Norvasc 10 mg daily, hydrochlorothiazide 25 mg daily. Remains elevated- will uptitrate inderal and intensify pain management  4. Dysphasia.[name]. dysphagia 3 thin liquids. Followup speech therapy    LOS (Days) 4 A FACE TO FACE EVALUATION WAS PERFORMED  [name],PETER FRANK 03/06/2013, 9:11 AM

## 2013-03-06 NOTE — Plan of Care (Signed)
Problem: RH BLADDER ELIMINATION Goal: RH STG MANAGE BLADDER WITH ASSISTANCE STG Manage Bladder With supervision  Outcome: Not Progressing incont x1

## 2013-03-06 NOTE — Progress Notes (Addendum)
Physical Therapy Note  Patient Details  Name: Eric May MRN: [id number] Date of Birth: 1960/11/24 Today's Date: 03/06/2013  [id number] (35 minutes) individual (pt missed 25 minutes secondary to continued refusal to participate ("I'm too cold")  Pain : Pt reports 6/10 pain left LE/ premedicated Focus of treatment : transfer training; Neuro re-ed LE LE Treatment: Pt sitting at nurses station complaining of being cold (pt has no additional clothes available); attempted sit to stand in parallel bars to facilitate weight bearing Lt LE with max encouragement mod assist ; pt stood with min assist for approximately one minute ( no complaint of pain left LE)  before sitting with continued c/o being too cold to participate and demanding to return to bed; transfer stand/turn wc to bed mod/max assist with max encouragement for patient to assist; sit to supine min assist left LE; pt covered with blanket; neuro re-ed left LE - AA hip flexion/extension, hip abduction with no complaint of pain; bed alarm activated.    Emaline Karnes,JIM 03/06/2013, 9:25 AM

## 2013-03-07 ENCOUNTER — Inpatient Hospital Stay (HOSPITAL_COMMUNITY): Payer: Medicaid Other | Admitting: Physical Therapy

## 2013-03-07 ENCOUNTER — Inpatient Hospital Stay (HOSPITAL_COMMUNITY): Payer: Medicaid Other | Admitting: Occupational Therapy

## 2013-03-07 ENCOUNTER — Inpatient Hospital Stay (HOSPITAL_COMMUNITY): Payer: Medicaid Other | Admitting: Speech Pathology

## 2013-03-07 MED ORDER — PROPRANOLOL HCL 20 MG PO TABS
20.0000 mg | ORAL_TABLET | Freq: Two times a day (BID) | ORAL | Status: DC
  Administered 2013-03-07 – 2013-03-10 (×6): 20 mg via ORAL
  Filled 2013-03-07 (×10): qty 1

## 2013-03-07 MED ORDER — LISINOPRIL 2.5 MG PO TABS
2.5000 mg | ORAL_TABLET | Freq: Every day | ORAL | Status: DC
  Administered 2013-03-07 – 2013-03-15 (×6): 2.5 mg via ORAL
  Filled 2013-03-07 (×10): qty 1

## 2013-03-07 MED ORDER — PANTOPRAZOLE SODIUM 40 MG PO TBEC
40.0000 mg | DELAYED_RELEASE_TABLET | Freq: Every day | ORAL | Status: DC
  Administered 2013-03-07 – 2013-04-14 (×38): 40 mg via ORAL
  Filled 2013-03-07 (×38): qty 1

## 2013-03-07 MED ORDER — PROPRANOLOL HCL 20 MG PO TABS
20.0000 mg | ORAL_TABLET | Freq: Two times a day (BID) | ORAL | Status: DC
  Filled 2013-03-07 (×2): qty 1

## 2013-03-07 MED ORDER — FLEET ENEMA 7-19 GM/118ML RE ENEM
1.0000 | ENEMA | Freq: Every day | RECTAL | Status: DC | PRN
  Administered 2013-03-07: 1 via RECTAL
  Filled 2013-03-07: qty 1

## 2013-03-07 MED ORDER — SENNOSIDES-DOCUSATE SODIUM 8.6-50 MG PO TABS
2.0000 | ORAL_TABLET | Freq: Two times a day (BID) | ORAL | Status: DC
  Administered 2013-03-07 – 2013-03-13 (×9): 2 via ORAL
  Filled 2013-03-07 (×9): qty 2

## 2013-03-07 NOTE — Progress Notes (Signed)
Occupational Therapy Session Note  Patient Details  Name: Eric May MRN: [id number] Date of Birth: 09/17/1960  Today's Date: 03/07/2013 Time: [id number] and [id number]  Time Calculation (min): 55 min and 25 min  Short Term Goals: Week 1:  OT Short Term Goal 1 (Week 1): Pt will complete toilet transfer with max assist 3 consecutive sessions OT Short Term Goal 2 (Week 1): Pt will complete bathing with mod assist  OT Short Term Goal 3 (Week 1): Pt will complete UB dressing with min assist OT Short Term Goal 4 (Week 1): Pt will complete LB dressing with max assist OT Short Term Goal 5 (Week 1): Pt will scan to Lt environment to locate items for self-care with mod verbal cues  Skilled Therapeutic Interventions/Progress Updates:    1) Pt seen for ADL retraining with focus on bed mobility, sit <> stand, and attention to Lt during self-care tasks.  Pt in bed upon arrival reporting "too cold" but therapist located clothes and pt willing to get OOB to wash up and don clean clothes. Provided assist to advance LLE to EOB and mod assist with supine to sit and max assist squat pivot to w/c with pt with increased initiation this session, however continuing to require max assist due to LLE weakness. Completed bathing and dressing at sink with focus on sequencing, problem solving, initiation, and standing balance.  Educated on hemi-dressing technique with pt requiring increased cues and assist due to decreased problem solving and initiation.  Pt easily frustrated and would curse at LUE to assist.  Encouragement provided throughout session which appeared to decrease frustration at this time.  Max assist sit > stand for perineal hygiene and pulling up pants with pt requiring total assist to complete LB dressing.  Setup for breakfast with pt dropping warm food on LUE and Lt lap with no awareness.  Adequate food intake with cues for pace and to check for pocketing.  Pt left with nurse tech to complete  breakfast.  2) Pt seen for 1:1 OT with focus on sustained attention, scanning to Lt, and functional transfers.  Pt received seated in w/c with mother and girlfriend present.  Attempted to engage in card activity with pt scanning to Lt to identify cards, identifying larger card, and use of LUE to assist in flipping cards.  Pt so distracted by constipation and heart burn that he was unable to sustain attention to task > 5 seconds without cues.  Attempted toileting as pt continuing to perseverate on need to go.  Sit > stand min assist with use of grab bar and mod assist pivot.  Pt unable to complete any clothing management pre or post attempt on toilet however maintained standing min assist while therapist completed clothing management.  Pt adamant about returning to bed after toileting attempt.  Stand pivot mod assist with therapist advancing LLE (attempted squat pivot however pt unable to redirect post standing).  Pt left in bed in chair position with mother and girlfriend at bedside.  Therapy Documentation Precautions:  Precautions Precautions: Fall Restrictions Weight Bearing Restrictions: No General: General Missed Time Reason: Patient unwilling/refused to participate without medical reason Vital Signs: Therapy Vitals Pulse Rate: 74 BP: 137/100 mmHg Patient Position, if appropriate: Lying Pain: Pain Assessment Pain Score: 7   See FIM for current functional status  Therapy/Group: Individual Therapy  [name], Davan Hark 03/07/2013, 12:13 PM

## 2013-03-07 NOTE — Progress Notes (Signed)
Dr. Wynn BankerKirsteins on unit.  Notified of patient's BP 143/101, HR 61; due Hydrodiuril 25 mg and Norvasc 10 mg at 0800.  Order received:  Okay to give Norvasc and Hydrodiuril now.

## 2013-03-07 NOTE — Progress Notes (Signed)
53 y.o. right-handed male with history of hypertension, tobacco and alcohol abuse. Admitted 02/23/2013 when patient developed left-sided numbness and weakness while at work. Patient reports falling at that time due to weakness on the left. Blood pressure 194/117. CT of the head showed acute parenchymal hemorrhage over the right temporal parietal region measuring 4.9 x 3.2 cm with local mass effect and midline shift of 4 mm. Echocardiogram with ejection fraction 65% no valvular abnormalities. Carotid Dopplers with no ICA stenosis. MRI of the brain 02/24/2013 again showed intraparenchymal hematoma 5.2 x 3.7 x 3.3 cm with surrounding vasogenic edema. Neurosurgery Dr. Conchita Paris consult advise conservative management with repeat serial cranial CT scans. Urine drug screen was positive for cocaine as well as marijuana. Maintained on Cardene drip for blood pressure control.  Diet advance to dysphagia 3 thin liquids 03/01/2013. Keppra for seizure prophylaxis  Subjective/Complaints: Pt sleepy but arousable, elevated diastolic, HR down to 61 this am but rebounded No agitation this am Review of Systems - unable to obtain secondary to reduced level of alertness Objective: Vital Signs: Blood pressure 130/108, pulse 91, temperature 98 F (36.7 C), temperature source Oral, resp. rate 20, height 6' (1.829 m), weight 60.1 kg (132 lb 7.9 oz), SpO2 90.00%. No results found. No results found for this or any previous visit (from the past 72 hour(s)).    Constitutional: He appears lethargic, no distress. Sleeping upon my entry  HENT: poor dentition, t Head: Normocephalic.  Eyes:  Pupils EOMI, without nystagmus  Neck: Normal range of motion. Neck supple. No thyromegaly present.  Cardiovascular: Normal rate and regular rhythm. No murmurs  Respiratory: Effort normal and breath sounds normal. No respiratory distress. No wheezes, rales, rhonchi  GI: Soft. Bowel sounds are normal. He exhibits no distension.   Neurological:  Patient is lethargic but arousable. Oriented to name and year but not date or month. He did follow some simple commands. . 1/5 left shoulder but otherwise was 0/5. LLE was 1-2/5 with HF and KE and trace at the ankle. Withdraws to deep pain stimulation in both the arm and the left leg. Speech moderately dysarthric.  RUE 4/5 RLE 5/5 Skin: Skin is warm and dry.  Psychiatric:  Flat, lethargic MSK:  + SLR on left only  Assessment/Plan: 1. 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 Physiatrist is providing close team supervision and 24 hour management of active medical problems listed below. Physiatrist and rehab team continue to assess barriers to discharge/monitor patient progress toward functional and medical goals. FIM: FIM - Bathing Bathing Steps Patient Completed: Chest;Abdomen;Front perineal area;Buttocks;Right upper leg;Left upper leg;Left Arm Bathing: 3: Mod-Patient completes 5-7 6f 10 parts or 50-74%  FIM - Upper Body Dressing/Undressing Upper body dressing/undressing steps patient completed: Thread/unthread right sleeve of pullover shirt/dresss;Put head through opening of pull over shirt/dress;Pull shirt over trunk Upper body dressing/undressing: 4: Min-Patient completed 75 plus % of tasks FIM - Lower Body Dressing/Undressing Lower body dressing/undressing steps patient completed:  (wears brief) Lower body dressing/undressing: 1: Total-Patient completed less than 25% of tasks  FIM - Toileting Toileting: 0: Activity did not occur  FIM - Archivist Transfers: 0-Activity did not occur  FIM - Banker Devices: Bed rails;Arm rests Bed/Chair Transfer: 2: Bed > Chair or W/C: Max A (lift and lower assist);2: Chair or W/C > Bed: Max A (lift and lower  assist)  FIM - Locomotion: Wheelchair Distance: 100 Locomotion: Wheelchair: 2: Travels 50 - 149 ft with minimal assistance (Pt.>75%) FIM - Locomotion: Ambulation Locomotion: Ambulation Assistive Devices:  (+2 HHA) Ambulation/Gait Assistance: 1: +2 Total assist Locomotion: Ambulation: 1: Two helpers  Comprehension Comprehension Mode: Auditory Comprehension: 4-Understands basic 75 - 89% of the time/requires cueing 10 - 24% of the time  Expression Expression Mode: Verbal Expression: 3-Expresses basic 50 - 74% of the time/requires cueing 25 - 50% of the time. Needs to repeat parts of sentences.  Social Interaction Social Interaction: 4-Interacts appropriately 75 - 89% of the time - Needs redirection for appropriate language or to initiate interaction.  Problem Solving Problem Solving: 3-Solves basic 50 - 74% of the time/requires cueing 25 - 49% of the time  Memory Memory: 3-Recognizes or recalls 50 - 74% of the time/requires cueing 25 - 49% of the time  Medical Problem List and Plan:  1. 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 DVT Prophylaxis/Anticoagulation: SCDs. Monitor for any signs of DVT  3. Pain Management: Tylenol as needed  4. Neuropsych: This patient is not capable of making decisions on his own behalf.  5. Seizure prophylaxis. Keppra 500 mg twice a day. Monitor for any seizure activity  6. Hypertension. Norvasc 10 mg daily, hydrochlorothiazide 25 mg daily. Monitor with increased mobility  7. Dysphasia.[name]. dysphagia 3 thin liquids. Followup speech therapy  8. History of tobacco alcohol abuse. Urine drug screen positive cocaine and marijuana. Provide counseling as appropriate  9. Thrush: 3 days of diflucan 10.  Left knee pain mainly with SLR, Did have a fall  L knee and back films unremarkable  MRI lumbar without compressive lesions some intrathecal  blood from ICH. No further w/u necessary unless symptoms change  LOS (Days) 5 A FACE TO FACE EVALUATION WAS PERFORMED  Eric May 03/07/2013, 8:34 AM

## 2013-03-07 NOTE — Progress Notes (Signed)
Speech Language Pathology Daily Session Note  Patient Details  Name: Eric May MRN: [id number] Date of Birth: 11/05/1960  Today's Date: 03/07/2013 Time: 1305-1400 Time Calculation (min): 55 min  Short Term Goals: Week 1: SLP Short Term Goal 1 (Week 1): Patient will sustain attention to basic self-care task for 1 minute with Max clinician cues. SLP Short Term Goal 2 (Week 1): Patient will solve basic problems with Max verbal cues. SLP Short Term Goal 3 (Week 1): Patient will label 2 physical and 1 cognitive deficit with Max questions cues. SLP Short Term Goal 4 (Week 1): Patient will consume Dys.3 textures and thin liquids with Mod cues for safety to prevent overt s/s of aspiration.  SLP Short Term Goal 5 (Week 1): Patient will utilize speech intelligibility strategies with Max clinician cues.  Skilled Therapeutic Interventions: Skilled treatment session focused on addressing dysphagia and cognitive-linguistic goals.  Upon SLP entering room patient with family members at bedside and reporting chest pains.  RN and PA notified and vitals were taken patient found to be ok.  Patient required 2+ assist transfer to chair and following several sips of Sprite followed by burps reported feeling better.  Patient required Total faded to Max assist to initiate and problem solve self-feeding with intermittent throat clears.  Overall patient appears to be tolerating current diet well.  Verbal perseveration on previous pain impacted ability to attend to self-feeding task.  SLP also facilitated session with scanning and problem solving task with Max cues to locate greater value card then the SLPs.  Mother and girlfriend present for session and SLP initiated education regarding role of SLP and patient's current deficits.    FIM:  Comprehension Comprehension Mode: Auditory Comprehension: 3-Understands basic 50 - 74% of the time/requires cueing 25 - 50%  of the time Expression Expression: 3-Expresses basic  50 - 74% of the time/requires cueing 25 - 50% of the time. Needs to repeat parts of sentences. Social Interaction Social Interaction: 4-Interacts appropriately 75 - 89% of the time - Needs redirection for appropriate language or to initiate interaction. Problem Solving Problem Solving: 1-Solves basic less than 25% of the time - needs direction nearly all the time or does not effectively solve problems and may need a restraint for safety Memory Memory: 2-Recognizes or recalls 25 - 49% of the time/requires cueing 51 - 75% of the time FIM - Eating Eating Activity: 3: Helper scoops food on utensil every scoop;4: Help with managing cup/glass;4: Help with picking up utensils;4: Helper checks for pocketed food;5: Needs verbal cues/supervision;5: Set-up assist for cut food;5: Set-up assist for open containers  Pain Pain Assessment Pain Assessment: 0-10 Pain Score: 6  Pain Type: Acute pain Pain Location: Knee Pain Orientation: Right Pain Intervention(s): Medication (See eMAR)  Therapy/Group: Individual Therapy  Charlane FerrettiMelissa Jaliana Medellin, M.A., CCC-SLP [phone]  Eric May 03/07/2013, 5:05 PM

## 2013-03-07 NOTE — Progress Notes (Signed)
I have reviewed and I agree with the following treatment note.  Annaleia Pence Hall, PT, DPT  

## 2013-03-07 NOTE — Progress Notes (Signed)
Physical Therapy Session Note  Patient Details  Name: Eric May MRN: [id number] Date of Birth: 15-May-1960  Today's Date: 03/07/2013 Time: 1004-1050 Time Calculation (min): 46 min  Skilled Therapeutic Interventions/Progress Updates:    Pt received seated at RN station. Pt transferred w/c>mat min A with verbal cues for technique and increased time for initiation and motor planning. Engaged pt in kicking ball seated edge of mat, alternating LE. Pt required max verbal cues for initiation and use of correct LE to kick. Pt refused further participation, stating "I've done too much today. Let me lie down." Gave pt 2 minute rest break on mat, pt somewhat agreeable to participate in rest of session. With max encouragement pt ambulated 15 feet with RW +2A. Pt propelled w/c with RUE and RLE to 75 ft to room min A with verbal cues for sequencing and max encouragement to continue. Pt transferred w/c>mat min A and sit>supine min A. Pt demonstrated increased confusion and difficulty answering simple questions this session. Pt able to use bed controls to raise [facility] as well as identify RN call button with max verbal cues. Pt left in bed with needs in reach.    Therapy Documentation Precautions:  Precautions Precautions: Fall Restrictions Weight Bearing Restrictions: No General: Amount of Missed PT Time (min): 14 Minutes Missed Time Reason: Patient unwilling/refused to participate without medical reason Pain: Pt c/o of pain, unable to describe pain location or intensity. Pt stated he had received medication. Repositioned/provided distraction.  See FIM for current functional status  Therapy/Group: Individual Therapy  Kimo Bancroft 03/07/2013, 12:04 PM

## 2013-03-08 ENCOUNTER — Inpatient Hospital Stay (HOSPITAL_COMMUNITY): Payer: Medicaid Other | Admitting: Occupational Therapy

## 2013-03-08 ENCOUNTER — Inpatient Hospital Stay (HOSPITAL_COMMUNITY): Payer: Medicaid Other | Admitting: Physical Therapy

## 2013-03-08 ENCOUNTER — Inpatient Hospital Stay (HOSPITAL_COMMUNITY): Payer: Medicaid Other | Admitting: Speech Pathology

## 2013-03-08 DIAGNOSIS — R209 Unspecified disturbances of skin sensation: Secondary | ICD-10-CM

## 2013-03-08 DIAGNOSIS — G8110 Spastic hemiplegia affecting unspecified side: Secondary | ICD-10-CM

## 2013-03-08 DIAGNOSIS — I69998 Other sequelae following unspecified cerebrovascular disease: Secondary | ICD-10-CM

## 2013-03-08 DIAGNOSIS — I619 Nontraumatic intracerebral hemorrhage, unspecified: Secondary | ICD-10-CM

## 2013-03-08 MED ORDER — GABAPENTIN 100 MG PO CAPS
100.0000 mg | ORAL_CAPSULE | Freq: Three times a day (TID) | ORAL | Status: DC
  Administered 2013-03-08 – 2013-04-14 (×110): 100 mg via ORAL
  Filled 2013-03-08 (×117): qty 1

## 2013-03-08 NOTE — Progress Notes (Signed)
Physical Therapy Session Note  Patient Details  Name: Eric May MRN: [id number] Date of Birth: 05-18-1960  Today's Date: 03/08/2013 Time: 1000-1055 Time Calculation (min): 55 min   Skilled Therapeutic Interventions/Progress Updates:    Pt received lying in bed. Pt initially refusing therapy stating "just let me sleep" but with redirection began to participate. Pt performed supine>sit mod A and transferred bed>w/c min A. Pt propelled w/c to therapy gym min A . Pt negotiated 3 steps x2 with +2A and max verbal cues for sequencing and technique. Pt continued to perseverate on being sleepy, difficult to redirect. Provided pt with 2 minute rest break on mat. Engaged pt in ball toss seated edge of mat x10 with max verbal cues to use LUE and continue participation. Pt stated need to use the bathroom, transferred mat>w/c mod A as pt attempted stand pivot vs scoot transfer. Returned pt to room, attempted to assist pt with transfer to toilet. Pt stated he did not want to use the bathroom. Rec therapist present to discuss hobbies/leisure interests, pt demonstrated difficulty answering basic questions. Max verbal cues for redirection and identification of impairments/situation. Pt agreeable to using bathroom before returning to bed, transferred w/c<>toilet mod A. Pt did not void or have BM, stated he only had gas. Pt transferred w/c>bed mod A, again attempting stand pivot vs scoot transfer. Pt left lying in bed with needs in reach.   Therapy Documentation Precautions:  Precautions Precautions: Fall Restrictions Weight Bearing Restrictions: No Pain: Pt denies pain    Locomotion : Wheelchair Mobility Distance: 120   See FIM for current functional status  Therapy/Group: Individual Therapy  [name] 03/08/2013, 12:18 PM

## 2013-03-08 NOTE — Progress Notes (Signed)
I have reviewed and I agree with the following treatment note.  Ilhan Debenedetto Hall, PT, DPT  

## 2013-03-08 NOTE — Progress Notes (Signed)
53 y.o. right-handed male with history of hypertension, tobacco and alcohol abuse. Admitted 02/23/2013 when patient developed left-sided numbness and weakness while at work. Patient reports falling at that time due to weakness on the left. Blood pressure 194/117. CT of the head showed acute parenchymal hemorrhage over the right temporal parietal region measuring 4.9 x 3.2 cm with local mass effect and midline shift of 4 mm. Echocardiogram with ejection fraction 65% no valvular abnormalities. Carotid Dopplers with no ICA stenosis. MRI of the brain 02/24/2013 again showed intraparenchymal hematoma 5.2 x 3.7 x 3.3 cm with surrounding vasogenic edema. Neurosurgery Dr. Conchita Paris consult advise conservative management with repeat serial cranial CT scans. Urine drug screen was positive for cocaine as well as marijuana. Maintained on Cardene drip for blood pressure control.  Diet advance to dysphagia 3 thin liquids 03/01/2013. Keppra for seizure prophylaxis  Subjective/Complaints: Pain on entire Left side of body Lumbar MRI no compressive lesions Review of Systems - unable to obtain secondary to reduced level of alertness Objective: Vital Signs: Blood pressure 133/88, pulse 79, temperature 98 F (36.7 C), temperature source Oral, resp. rate 18, height 6' (1.829 m), weight 60.1 kg (132 lb 7.9 oz), SpO2 100.00%. No results found. No results found for this or any previous visit (from the past 72 hour(s)).    Constitutional: He appears lethargic, no distress. Sleeping upon my entry  HENT: poor dentition, t Head: Normocephalic.  Eyes:  Pupils EOMI, without nystagmus  Neck: Normal range of motion. Neck supple. No thyromegaly present.  Cardiovascular: Normal rate and regular rhythm. No murmurs  Respiratory: Effort normal and breath sounds normal. No respiratory distress. No wheezes, rales, rhonchi  GI: Soft. Bowel sounds are normal. He exhibits no distension.  Neurological:  Patient is lethargic but  arousable. Oriented to name and year but not date or month. He did follow some simple commands.Loses attention quickly . 1/5 left shoulder but otherwise was 0/5. LLE was 1-2/5 with HF and KE and trace at the ankle. Withdraws to deep pain stimulation in both the arm and the left leg. Speech moderately dysarthric.  RUE 4/5 RLE 5/5 Skin: Skin is warm and dry.  Psychiatric:  Flat, lethargic MSK:  + SLR on left only  Assessment/Plan: 1. 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 Physiatrist is providing close team supervision and 24 hour management of active medical problems listed below. Physiatrist and rehab team continue to assess barriers to discharge/monitor patient progress toward functional and medical goals. FIM: FIM - Bathing Bathing Steps Patient Completed: Chest;Abdomen;Front perineal area;Right upper leg;Left upper leg;Left Arm Bathing: 3: Mod-Patient completes 5-7 41f 10 parts or 50-74%  FIM - Upper Body Dressing/Undressing Upper body dressing/undressing steps patient completed: Thread/unthread right sleeve of pullover shirt/dresss;Put head through opening of pull over shirt/dress Upper body dressing/undressing: 3: Mod-Patient completed 50-74% of tasks FIM - Lower Body Dressing/Undressing Lower body dressing/undressing steps patient completed:  (wears brief) Lower body dressing/undressing: 1: Total-Patient completed less than 25% of tasks  FIM - Toileting Toileting: 1: Total-Patient completed zero steps, helper did all 3  FIM - Diplomatic Services operational officer Devices: Grab bars Toilet Transfers: 3-To toilet/BSC: Mod A (lift or lower assist);3-From toilet/BSC: Mod A (lift or lower assist)  FIM - Bed/Chair Transfer Bed/Chair Transfer Assistive Devices: Bed rails;Arm rests Bed/Chair Transfer: 3: Chair or  W/C > Bed: Mod A (lift or lower assist);4:  Sit > Supine: Min A (steadying pt. > 75%/lift 1 leg)  FIM - Locomotion: Wheelchair Distance: 75 Locomotion: Wheelchair: 2: Travels 50 - 149 ft with minimal assistance (Pt.>75%) FIM - Locomotion: Ambulation Locomotion: Ambulation Assistive Devices: Designer, industrial/productWalker - Rolling Ambulation/Gait Assistance: 1: +2 Total assist Locomotion: Ambulation: 1: Two helpers  Comprehension Comprehension Mode: Auditory Comprehension: 4-Understands basic 75 - 89% of the time/requires cueing 10 - 24% of the time  Expression Expression Mode: Verbal Expression: 3-Expresses basic 50 - 74% of the time/requires cueing 25 - 50% of the time. Needs to repeat parts of sentences.  Social Interaction Social Interaction: 4-Interacts appropriately 75 - 89% of the time - Needs redirection for appropriate language or to initiate interaction.  Problem Solving Problem Solving: 3-Solves basic 50 - 74% of the time/requires cueing 25 - 49% of the time  Memory Memory: 3-Recognizes or recalls 50 - 74% of the time/requires cueing 25 - 49% of the time  Medical Problem List and Plan:  1. 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 DVT Prophylaxis/Anticoagulation: SCDs. Monitor for any signs of DVT  3. 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 Neuropsych: This patient is not capable of making decisions on his own behalf.  5. Seizure prophylaxis. Keppra 500 mg twice a day. Monitor for any seizure activity  6. Hypertension. Norvasc 10 mg daily, hydrochlorothiazide 25 mg daily. Monitor with increased mobility  7. Dysphasia.[name]. dysphagia 3 thin liquids. Followup speech therapy  8. History of tobacco alcohol abuse. Urine drug screen positive cocaine and marijuana. Provide counseling as appropriate    LOS  (Days) 6 A FACE TO FACE EVALUATION WAS PERFORMED  KIRSTEINS,ANDREW E 03/08/2013, 9:00 AM

## 2013-03-08 NOTE — Progress Notes (Signed)
Speech Language Pathology Daily Session Note  Patient Details  Name: Eric May MRN: [id number] Date of Birth: 09-Nov-1960  Today's Date: 03/08/2013 Time: 1303-1400 Time Calculation (min): 57 min  Short Term Goals: Week 1: SLP Short Term Goal 1 (Week 1): Patient will sustain attention to basic self-care task for 1 minute with Max clinician cues. SLP Short Term Goal 2 (Week 1): Patient will solve basic problems with Max verbal cues. SLP Short Term Goal 3 (Week 1): Patient will label 2 physical and 1 cognitive deficit with Max questions cues. SLP Short Term Goal 4 (Week 1): Patient will consume Dys.3 textures and thin liquids with Mod cues for safety to prevent overt s/s of aspiration.  SLP Short Term Goal 5 (Week 1): Patient will utilize speech intelligibility strategies with Max clinician cues.  Skilled Therapeutic Interventions: Skilled treatment session focused on addressing dysphagia and cognitive-linguistic goals.  Patient required 2+ assist to transfer from bed to chair.  Patient required Total faded to Max assist to initiate and problem solve self-feeding; no overt s/s were observed.  Patient perseverated on overall difficulty of tasks throughout session.  SLP also facilitated session with card game that required him to sustain attention to task, scan left of midline and identify greatest value when comparing two cards; patient required Mod cues for initial 5 minutes and then due to attention, pain and perseveration cues were increased to Max assist for next 5 minutes.   Continue with current plan of care.    FIM:  Comprehension Comprehension Mode: Auditory Comprehension: 3-Understands basic 50 - 74% of the time/requires cueing 25 - 50%  of the time Expression Expression: 3-Expresses basic 50 - 74% of the time/requires cueing 25 - 50% of the time. Needs to repeat parts of sentences. Social Interaction Social Interaction: 4-Interacts appropriately 75 - 89% of the time - Needs  redirection for appropriate language or to initiate interaction. Problem Solving Problem Solving: 1-Solves basic less than 25% of the time - needs direction nearly all the time or does not effectively solve problems and may need a restraint for safety Memory Memory: 2-Recognizes or recalls 25 - 49% of the time/requires cueing 51 - 75% of the time FIM - Eating Eating Activity: 3: Helper scoops food on utensil every scoop;4: Help with managing cup/glass;4: Help with picking up utensils;4: Helper checks for pocketed food;5: Needs verbal cues/supervision;5: Set-up assist for cut food;5: Set-up assist for open containers;1: Helper feeds patient  Pain Pain Assessment Pain Assessment: No/denies pain  Therapy/Group: Individual Therapy  Charlane FerrettiMelissa Brianda Beitler, M.A., CCC-SLP [phone]  Llesenia Fogal 03/08/2013, 4:10 PM

## 2013-03-08 NOTE — Progress Notes (Signed)
Occupational Therapy Session Note  Patient Details  Name: Eric May MRN: [id number] Date of Birth: 1960-09-21  Today's Date: 03/08/2013 Time: 0830-0900 and [id number] Time Calculation (min): 30 min and 17 min  Short Term Goals: Week 1:  OT Short Term Goal 1 (Week 1): Pt will complete toilet transfer with max assist 3 consecutive sessions OT Short Term Goal 2 (Week 1): Pt will complete bathing with mod assist  OT Short Term Goal 3 (Week 1): Pt will complete UB dressing with min assist OT Short Term Goal 4 (Week 1): Pt will complete LB dressing with max assist OT Short Term Goal 5 (Week 1): Pt will scan to Lt environment to locate items for self-care with mod verbal cues  Skilled Therapeutic Interventions/Progress Updates:    1) Pt seen for ADL retraining with focus on increased participation in self-care tasks.  Pt's girlfriend, [name], present during session.  Pt c/o pain throughout Lt side of body and requesting to return to bed.  Educated pt on purpose of therapy and increasing participation, pt able to be redirected for short periods of time with focus on getting dressed.  Focus on sequencing, problem solving, initiation, and standing balance. Educated on hemi-dressing technique with pt requiring increased cues and assist due to decreased problem solving and initiation with UB dressing.  Total assist for LB dressing.  Pt min assist sit <> stand with therapist pulling pants over hips.  Pt again requesting to return to bed once dressed, engaged in squat pivot to bed with mod assist with pt with increased control. With girlfriend, attempted to re-educate on pain management and participation in therapy with pt adamant about returning to bed.  2) Pt seen for 1:1 OT with focus on functional use of LUE.  Pt received from SLP who reports pt perseverating on returning to bed.  Attempted to redirect pt to participate in treatment session.  Discussed purpose of rehab and focus of therapy, with pt  reporting "I understand, but I need to go back to bed".  Focus on LUE functional use and 3 jaw chuck/pinch with grasping checkers to return to box.  Pt unable to pick checkers from table, however able to pinch to stabilize checker when taking it from therapist.  Pt with decreased coordination when bringing LUE across midline to drop in box and with decreased grasp.  Attempted support at elbow to decrease effects of gravity, pt easily frustrated throughout session insisting on returning to bed.  Pt began to escalate with repeatedly insisting on returning to bed.  Performed squat pivot transfer mod assist to bed, attempted to educate on proper positioning in bed with pt turning head away from therapist and continuing to curse.  Therapy Documentation Precautions:  Precautions Precautions: Fall Restrictions Weight Bearing Restrictions: No General: General Amount of Missed OT Time (min): 43 Minutes Pain:  Pt with reports of pain throughout Lt side of body but unable to rate.  Attempted education regarding med schedule and repositioned.  See FIM for current functional status  Therapy/Group: Individual Therapy  [name], Nicosha Struve 03/08/2013, 9:17 AM

## 2013-03-08 NOTE — Progress Notes (Signed)
Recreational Therapy Assessment and Plan  Patient Details  Name: Eric May MRN: [id number] Date of Birth: [birth year] Today's Date: 03/08/2013  Rehab Potential: Good ELOS: 3 weeks   Assessment Clinical Impression:Problem List:  Patient Active Problem List    Diagnosis  Date Noted   .  ICH (intracerebral hemorrhage)  03/02/2013   .  Convulsions/seizures  02/24/2013   .  Intracerebral hemorrhage  02/23/2013    Past Medical History:  Past Medical History   Diagnosis  Date   .  Hypertension     Past Surgical History: No past surgical history on file.  Assessment & Plan  Clinical Impression: Patient is a 53 y.o. right-handed male with history of hypertension, tobacco and alcohol abuse. Admitted 02/23/2013 when patient developed left-sided numbness and weakness while at work. Patient reports falling at that time due to weakness on the left. Blood pressure 194/117. CT of the head showed acute parenchymal hemorrhage over the right temporal parietal region measuring 4.9 x 3.2 cm with local mass effect and midline shift of 4 mm. Echocardiogram with ejection fraction 65% no valvular abnormalities. Carotid Dopplers with no ICA stenosis. MRI of the brain 02/24/2013 again showed intraparenchymal hematoma 5.2 x 3.7 x 3.3 cm with surrounding vasogenic edema. Neurosurgery Dr. Kathyrn Sheriff consult advise conservative management with repeat serial cranial CT scans. Urine drug screen was positive for cocaine as well as marijuana. Maintained on Cardene drip for blood pressure control. Diet advance to dysphagia 3 thin liquids 03/01/2013. Keppra for seizure prophylaxis. Physical therapy evaluation completed 02/26/2013 with recommendations for physical medicine rehabilitation consult to consider inpatient rehabilitation services. Patient was felt to be a good candidate for inpatient rehabilitation services.  Patient transferred to CIR on 03/02/2013.   Pt presents with decreased activity tolerance, decreased  functional mobility, decreased balance, decreased coordination, left inattention, left sided weakness, decreased initiation, decreased safety, decreased attention, decreased problem solving, decreased memory limiting pt's independence with leisure/community pursuits.  Leisure History/Participation Premorbid leisure interest/current participation: Games - Cards Other Leisure Interests: Television Psychosocial / Spiritual Social interaction - Mood/Behavior: Restless Community Reintegration Appropriate for Education?: Yes Strengths/Weaknesses Patient weaknesses: Physical limitations TR Patient demonstrates impairments in the following area(s): Behavior;Endurance;Motor;Pain;Safety  Plan Rec Therapy Plan Is patient appropriate for Therapeutic Recreation?: Yes Rehab Potential: Good Treatment times per week: Min 1 time per week >20 mintues Estimated Length of Stay: 3 weeks TR Treatment/Interventions: Adaptive equipment instruction;1:1 session;Balance/vestibular training;Functional mobility training;Community reintegration;Cognitive remediation/compensation;Patient/family education;Therapeutic activities;Recreation/leisure participation;Therapeutic exercise;UE/LE Coordination activities  Recommendations for other services: Neuropsych  Discharge Criteria: Patient will be discharged from TR if patient refuses treatment 3 consecutive times without medical reason.  If treatment goals not met, if there is a change in medical status, if patient makes no progress towards goals or if patient is discharged from hospital.  The above assessment, treatment plan, treatment alternatives and goals were discussed and mutually agreed upon: by patient  [place] 03/08/2013, 11:19 AM

## 2013-03-09 ENCOUNTER — Inpatient Hospital Stay (HOSPITAL_COMMUNITY): Payer: Medicaid Other | Admitting: Speech Pathology

## 2013-03-09 ENCOUNTER — Inpatient Hospital Stay (HOSPITAL_COMMUNITY): Payer: Medicaid Other | Admitting: Occupational Therapy

## 2013-03-09 ENCOUNTER — Ambulatory Visit (HOSPITAL_COMMUNITY): Payer: Self-pay | Admitting: Physical Therapy

## 2013-03-09 NOTE — Progress Notes (Signed)
Physical Therapy Weekly Progress Note  Patient Details  Name: Eric May MRN: [id number] Date of Birth: September 25, 1960  Today's Date: 03/09/2013 Time: 8550-1586 Time Calculation (min): 45 min  Patient has met 1 of 7 long term goals.  Short term goals not set due to estimated length of stay.  Pt demonstrates improvement with w/c mobility, requiring min A for propulsion and navigation. Pt is inconsistent with basic transfers and fluctuates between min-mod A depending on level of motivation and attention. Pt requires +2A for standing and ambulation. Pt is significantly limited by decreased motivation, pain, and perseveration on bowels and participation in therapy.   Patient continues to demonstrate the following deficits: L hemiparesis, impaired balance, impaired coordination, impaired gait, L inattention, impaired safety awareness, impaired attention, cognitive deficits and therefore will continue to benefit from skilled PT intervention to enhance overall performance with activity tolerance, balance, postural control, ability to compensate for deficits, functional use of  left upper extremity and left lower extremity, attention, awareness and coordination.  See Patient's Care Plan for progression toward long term goals.  Patient progressing toward long term goals..  Continue plan of care.  Skilled Therapeutic Interventions/Progress Updates:    Pt received from SLP session with family present. Pt more calm and agreeable to participation this session. Pt stated need to use bathroom, transported pt to room. Pt transferred w/c<>toilet using grab bar mod A. Pt unable to have BM, but agreeable to continuing session. Pt propelled w/c 50 ft with hemi technique min A and verbal cues to utilize RLE to assist with propulsion. Pt ambulated 39 ft +2A with verbal cues for sequencing and manual facilitation for weight shifting, blocking of L knee buckling, and advancement of LLE. Engaged pt in card sorting task using  LUE. Pt able to maintain attention to task to accurately sort 8 cards. Attempted second ambulation trial, pt began to perseverate on need to bathroom again and unable to focus on ambulation. Returned pt to room, transferred w/c<>toilet mod A. Pt again unsuccessful using bathroom. Pt left in bed with needs in reach.   Therapy Documentation Precautions:  Precautions Precautions: Fall Restrictions Weight Bearing Restrictions: No Pain: Pt c/o of bowel discomfort, provided distraction/increased activity. RN aware of bowel concerns.  See FIM for current functional status  Therapy/Group: Individual Therapy  Randon Somera, [place] 03/09/2013, 8:50 AM

## 2013-03-09 NOTE — Progress Notes (Signed)
Occupational Therapy Session Note  Patient Details  Name: Eric May MRN: [id number] Date of Birth: 1960/09/11  Today's Date: 03/09/2013 Time: [id number] Time Calculation (min): 26 min  Short Term Goals: Week 1:  OT Short Term Goal 1 (Week 1): Pt will complete toilet transfer with max assist 3 consecutive sessions OT Short Term Goal 2 (Week 1): Pt will complete bathing with mod assist  OT Short Term Goal 3 (Week 1): Pt will complete UB dressing with min assist OT Short Term Goal 4 (Week 1): Pt will complete LB dressing with max assist OT Short Term Goal 5 (Week 1): Pt will scan to Lt environment to locate items for self-care with mod verbal cues  Skilled Therapeutic Interventions/Progress Updates:  Patient resting in bed with HOB elevated upon arrival performing LUE SROM exercises.  Patient reports concerns related to bowel pain therefore, he agreed to get out of bed and perform commode transfer.  Patient performs 1/2 squat and 1/2 stand pivot transfers with trunk extension which requires more assistance and makes the transfer unsafe.  Patient too occupied with bowel pain that he did not want to focus on transfer training.  RN present and performed digital stimulation.  Reviewed need to be up and moving which will promote movement of bowels however, patient insisted back to bed due to pain.  Assisted patient back to bed and on his left side with all items in place.  Therapy Documentation Precautions:  Precautions Precautions: Fall Restrictions Weight Bearing Restrictions: No Pain: C/o pain related to constipation, not rated, attempted toileting to relieve pain with no results, RN aware  ADL: See FIM for current functional status  Therapy/Group: Individual Therapy  [name] 03/09/2013, 10:14 AM

## 2013-03-09 NOTE — Progress Notes (Signed)
I have reviewed and I agree with the following treatment note.  Sherriann Szuch Hall, PT, DPT  

## 2013-03-09 NOTE — Progress Notes (Signed)
53 y.o. right-handed male with history of hypertension, tobacco and alcohol abuse. Admitted 02/23/2013 when patient developed left-sided numbness and weakness while at work. Patient reports falling at that time due to weakness on the left. Blood pressure 194/117. CT of the head showed acute parenchymal hemorrhage over the right temporal parietal region measuring 4.9 x 3.2 cm with local mass effect and midline shift of 4 mm. Echocardiogram with ejection fraction 65% no valvular abnormalities. Carotid Dopplers with no ICA stenosis. MRI of the brain 02/24/2013 again showed intraparenchymal hematoma 5.2 x 3.7 x 3.3 cm with surrounding vasogenic edema. Neurosurgery Dr. Kathyrn Sheriff consult advise conservative management with repeat serial cranial CT scans. Urine drug screen was positive for cocaine as well as marijuana. Maintained on Cardene drip for blood pressure control.  Diet advance to dysphagia 3 thin liquids 03/01/2013. Keppra for seizure prophylaxis  Subjective/Complaints: No pain c/os C/o bowel problems Review of Systems - unable to obtain secondary to reduced level of alertness Objective: Vital Signs: Blood pressure 116/87, pulse 73, temperature 98 F (36.7 C), temperature source Oral, resp. rate 18, height 6' (1.829 m), weight 60.1 kg (132 lb 7.9 oz), SpO2 99.00%. No results found. No results found for this or any previous visit (from the past 72 hour(s)).    Constitutional: He appears lethargic, no distress. Sleeping upon my entry  HENT: poor dentition, t Head: Normocephalic.  Eyes:  Pupils EOMI, without nystagmus  Neck: Normal range of motion. Neck supple. No thyromegaly present.  Cardiovascular: Normal rate and regular rhythm. No murmurs  Respiratory: Effort normal and breath sounds normal. No respiratory distress. No wheezes, rales, rhonchi  GI: Soft. Bowel sounds are normal. He exhibits no distension.  Neurological:  Patient is lethargic but arousable. Oriented to name and year but  not date or month. He did follow some simple commands.Loses attention quickly . 1/5 left shoulder but otherwise was 0/5. LLE was 1-2/5 with HF and KE and trace at the ankle.  Speech moderately dysarthric.  RUE 4/5 RLE 5/5 Skin: Skin is warm and dry.  Psychiatric:  Flat, lethargic MSK:  + SLR on left only  Assessment/Plan: 1. 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 Physiatrist is providing close team supervision and 24 hour management of active medical problems listed below. Physiatrist and rehab team continue to assess barriers to discharge/monitor patient progress toward functional and medical goals. Team conference today please see physician documentation under team conference tab, met with team face-to-face to discuss problems,progress, and goals. Formulized individual treatment plan based on medical history, underlying problem and comorbidities. FIM: FIM - Bathing Bathing Steps Patient Completed: Chest;Abdomen;Front perineal area;Right upper leg;Left upper leg;Left Arm Bathing: 3: Mod-Patient completes 5-7 54f 10 parts or 50-74%  FIM - Upper Body Dressing/Undressing Upper body dressing/undressing steps patient completed: Thread/unthread right sleeve of pullover shirt/dresss;Put head through opening of pull over shirt/dress Upper body dressing/undressing: 3: Mod-Patient completed 50-74% of tasks FIM - Lower Body Dressing/Undressing Lower body dressing/undressing steps patient completed:  (wears brief) Lower body dressing/undressing: 1: Total-Patient completed less than 25% of tasks  FIM - Toileting Toileting: 1: Total-Patient completed zero steps, helper did all 3  FIM - Radio producer Devices: Grab bars Toilet Transfers: 3-To toilet/BSC: Mod A (lift or lower assist);3-From toilet/BSC: Mod A  (lift or lower assist)  FIM - Engineer, site Assistive Devices: Arm rests Bed/Chair  Transfer: 3: Supine > Sit: Mod A (lifting assist/Pt. 50-74%/lift 2 legs;4: Sit > Supine: Min A (steadying pt. > 75%/lift 1 leg);4: Bed > Chair or W/C: Min A (steadying Pt. > 75%);3: Chair or W/C > Bed: Mod A (lift or lower assist)  FIM - Locomotion: Wheelchair Distance: 120 Locomotion: Wheelchair: 1: Travels less than 50 ft with minimal assistance (Pt.>75%) FIM - Locomotion: Ambulation Locomotion: Ambulation Assistive Devices: Administrator Ambulation/Gait Assistance: 1: +2 Total assist Locomotion: Ambulation: 0: Activity did not occur  Comprehension Comprehension Mode: Auditory Comprehension: 3-Understands basic 50 - 74% of the time/requires cueing 25 - 50%  of the time  Expression Expression Mode: Verbal Expression: 3-Expresses basic 50 - 74% of the time/requires cueing 25 - 50% of the time. Needs to repeat parts of sentences.  Social Interaction Social Interaction: 4-Interacts appropriately 75 - 89% of the time - Needs redirection for appropriate language or to initiate interaction.  Problem Solving Problem Solving: 1-Solves basic less than 25% of the time - needs direction nearly all the time or does not effectively solve problems and may need a restraint for safety  Memory Memory: 2-Recognizes or recalls 25 - 49% of the time/requires cueing 51 - 75% of the time  Medical Problem List and Plan:  1. 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 DVT Prophylaxis/Anticoagulation: SCDs. Monitor for any signs of DVT  3. 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 Neuropsych: This patient is not capable of making decisions on his own behalf.  5. Seizure prophylaxis. Keppra 500 mg twice  a day. Monitor for any seizure activity  6. Hypertension. Norvasc 10 mg daily, hydrochlorothiazide 25 mg daily. Monitor with increased mobility  7. Dysphasia.[name] dysphagia 3 thin liquids. Followup speech therapy  8. History of tobacco alcohol abuse. Urine drug screen positive cocaine and marijuana. Provide counseling as appropriate    LOS (Days) 7 A FACE TO FACE EVALUATION WAS PERFORMED  Emmabelle Fear E 03/09/2013, 11:08 AM

## 2013-03-09 NOTE — Progress Notes (Signed)
Speech Language Pathology Daily Session Note  Patient Details  Name: Eric May MRN: [id number] Date of Birth: Jun 12, 1960  Today's Date: 03/09/2013 Time: 1333-1430 Time Calculation (min): 57 min  Short Term Goals: Week 1: SLP Short Term Goal 1 (Week 1): Patient will sustain attention to basic self-care task for 1 minute with Max clinician cues. SLP Short Term Goal 2 (Week 1): Patient will solve basic problems with Max verbal cues. SLP Short Term Goal 3 (Week 1): Patient will label 2 physical and 1 cognitive deficit with Max questions cues. SLP Short Term Goal 4 (Week 1): Patient will consume Dys.3 textures and thin liquids with Mod cues for safety to prevent overt s/s of aspiration.  SLP Short Term Goal 5 (Week 1): Patient will utilize speech intelligibility strategies with Max clinician cues.  Skilled Therapeutic Interventions: Skilled treatment session focused on addressing dysphagia and cognitive-linguistic goals.  Patient required 2+ assist to transfer from bed to chair.  Patient required Total faded to Max assist to initiate and problem solve self-feeding; no overt s/s were observed.  Patient perseverated on pain and needing to use the bathroom throughout session today.  SLP also facilitated session with card game that required him to mildly select attention to scanning task with Max verbal cues.  Patient participated for 5 minutes before refusing to further participate, with Max encouragement patient able to complete 2 more trials.  Pain medicine administered during session appeared to impact arousal.  SLP also initiated education regarding rehab philosophy, CVA recovery and need for 24/7 Supervision following discharge with father and step-mother.   Continue with current plan of care.   FIM:  Comprehension Comprehension Mode: Auditory Comprehension: 3-Understands basic 50 - 74% of the time/requires cueing 25 - 50%  of the time Expression Expression: 3-Expresses basic 50 - 74% of  the time/requires cueing 25 - 50% of the time. Needs to repeat parts of sentences. Social Interaction Social Interaction: 4-Interacts appropriately 75 - 89% of the time - Needs redirection for appropriate language or to initiate interaction. Problem Solving Problem Solving: 1-Solves basic less than 25% of the time - needs direction nearly all the time or does not effectively solve problems and may need a restraint for safety Memory Memory: 1-Recognizes or recalls less than 25% of the time/requires cueing greater than 75% of the time FIM - Eating Eating Activity: 3: Helper scoops food on utensil every scoop;4: Help with managing cup/glass;4: Help with picking up utensils;4: Helper checks for pocketed food;5: Needs verbal cues/supervision;5: Set-up assist for cut food;5: Set-up assist for open containers;1: Helper feeds patient  Pain Pain Assessment Pain Assessment: Yes Faces Pain Scale: Hurts little more Pain Type: Acute pain Pain Location: Generalized Pain Descriptors / Indicators: Aching Pain Frequency: Intermittent Pain Intervention(s): Medication (See eMAR)  Therapy/Group: Individual Therapy  Charlane FerrettiMelissa Truman Aceituno, M.A., CCC-SLP [phone]  Elenor Wildes 03/09/2013, 5:01 PM

## 2013-03-09 NOTE — Progress Notes (Signed)
Recreational Therapy Session Note  Patient Details  Name: Eric May MRN: [id number] Date of Birth: 09-13-1960 Today's Date: 03/09/2013  Pain: no c/o Skilled Therapeutic Interventions/Progress Updates: Met with pt's family to obtain leisure interest information.  Lengthy discussion with girlfriend about hobbies,etc.  Confirmed activities of interest with pt in which he stated agreement.  Pt then ambulated with +2 assist ~39 feet.  Pt's family (girlfriend, father & step mother) present & observing.  Zaide Mcclenahan 03/09/2013, 4:00 PM

## 2013-03-09 NOTE — Progress Notes (Signed)
Occupational Therapy Session Note  Patient Details  Name: Eric May MRN: [id number] Date of Birth: [birth year]  Today's Date: 03/09/2013 Time: [id number] Time Calculation (min): 37 min  Short Term Goals: Week 1:  OT Short Term Goal 1 (Week 1): Pt will complete toilet transfer with max assist 3 consecutive sessions OT Short Term Goal 2 (Week 1): Pt will complete bathing with mod assist  OT Short Term Goal 3 (Week 1): Pt will complete UB dressing with min assist OT Short Term Goal 4 (Week 1): Pt will complete LB dressing with max assist OT Short Term Goal 5 (Week 1): Pt will scan to Lt environment to locate items for self-care with mod verbal cues  Skilled Therapeutic Interventions/Progress Updates:    Pt seen for ADL retraining with focus on transfers, sit <> stand, and increased sustained attention to self-care tasks.  Pt in bed upon arrival with RN providing meds and encouraging food intake.  Pt willing to get OOB to don clothes this session.  While up in w/c pt reports need to toilet and began perseverating on his medication and how it was causing him pain with attempts to have a BM.  Attempted to educate on need for medication for pain and the medication to assist with BM, pt perseverating on "you just don't understand" and "why do they keep giving me medicine".  Performed stand pivot transfer to toilet with use of grab bar and mod assist.  Pt with no success on toilet, continuing to perseverate on pain and medications.  Pt completed modified bathing and UB dressing with mod verbal cues and assist to thread LUE due to decreased attention to task.  Pt stood x2 to pull up pants, total assist LB dressing.  Pt adamantly requesting to return to bed once dressed, performed squat pivot to bed with min-mod assist with max cues for safety and sequencing.    Therapy Documentation Precautions:  Precautions Precautions: Fall Restrictions Weight Bearing Restrictions: No General: General Amount of  Missed OT Time (min): 8 Minutes Pain:  Pt with c/o stomach and chest pain, unrated.  RN having just premedicated pt.  See FIM for current functional status  Therapy/Group: Individual Therapy  [name], Anitha Kreiser 03/09/2013, 11:40 AM

## 2013-03-10 ENCOUNTER — Inpatient Hospital Stay (HOSPITAL_COMMUNITY): Payer: Medicaid Other | Admitting: Occupational Therapy

## 2013-03-10 ENCOUNTER — Inpatient Hospital Stay (HOSPITAL_COMMUNITY): Payer: Medicaid Other

## 2013-03-10 ENCOUNTER — Inpatient Hospital Stay (HOSPITAL_COMMUNITY): Payer: Medicaid Other | Admitting: Speech Pathology

## 2013-03-10 MED ORDER — MEGESTROL ACETATE 400 MG/10ML PO SUSP
400.0000 mg | Freq: Two times a day (BID) | ORAL | Status: DC
  Administered 2013-03-10 – 2013-03-17 (×13): 400 mg via ORAL
  Filled 2013-03-10 (×18): qty 10

## 2013-03-10 MED ORDER — TRAMADOL HCL 50 MG PO TABS
50.0000 mg | ORAL_TABLET | Freq: Four times a day (QID) | ORAL | Status: DC | PRN
  Administered 2013-03-11 – 2013-04-14 (×65): 50 mg via ORAL
  Filled 2013-03-10 (×66): qty 1

## 2013-03-10 MED ORDER — BOOST / RESOURCE BREEZE PO LIQD
1.0000 | Freq: Three times a day (TID) | ORAL | Status: DC
  Administered 2013-03-10 – 2013-04-14 (×87): 1 via ORAL

## 2013-03-10 MED ORDER — QUETIAPINE FUMARATE 25 MG PO TABS
25.0000 mg | ORAL_TABLET | Freq: Every day | ORAL | Status: DC
  Administered 2013-03-10: 25 mg via ORAL
  Filled 2013-03-10 (×2): qty 1

## 2013-03-10 NOTE — Progress Notes (Signed)
Occupational Therapy Session Note  Patient Details  Name: Eric May MRN: [id number] Date of Birth: 08-19-1960  Today's Date: 03/10/2013 Time: [id number] Time Calculation (min): 39 min  Short Term Goals: Week 1:  OT Short Term Goal 1 (Week 1): Pt will complete toilet transfer with max assist 3 consecutive sessions OT Short Term Goal 2 (Week 1): Pt will complete bathing with mod assist  OT Short Term Goal 3 (Week 1): Pt will complete UB dressing with min assist OT Short Term Goal 4 (Week 1): Pt will complete LB dressing with max assist OT Short Term Goal 5 (Week 1): Pt will scan to Lt environment to locate items for self-care with mod verbal cues  Skilled Therapeutic Interventions/Progress Updates:    Pt seen for ADL retraining with focus on sit <> stand, standing balance, and functional use of LUE during self-care tasks of bathing and dressing.  Pt received sitting up in w/c with mother and girlfriend present.  Pt and family report that he did not sleep much last night as he was up with constipation followed by multiple BMs.  Pt engaged in bathing and dressing at sink with focus on sequencing, problem solving, and initiation with bathing and dressing.  Pt with increased participation this session.  Increased ability to follow one step directions with sequencing of UB dressing.  Engaged in sit <> stand x5 for LB bathing and dressing with pt min-mod assist for sit <> stand with tactile cues at Lt knee as it tends to hyperextend when in standing.  Engaged in oral hygiene in sitting with setup assist.  Pt willing to stay seated in w/c to visit with family.  Therapy Documentation Precautions:  Precautions Precautions: Fall Restrictions Weight Bearing Restrictions: No General: General Amount of Missed OT Time (min): 6 Minutes Pain:  Pt with no c/o pain this session.  See FIM for current functional status  Therapy/Group: Individual Therapy  [name], Heath Badon 03/10/2013, 10:39 AM

## 2013-03-10 NOTE — Progress Notes (Signed)
Physical Therapy Session Note  Patient Details  Name: Eric May MRN: [id number] Date of Birth: August 28, 1960  Today's Date: 03/10/2013 Time: 1030-1110 Time Calculation (min): 40 min  Skilled Therapeutic Interventions/Progress Updates:    Pt received seated in w/c with mother present. Pt agreeable to session and stated "I am ready to do my therapy." Pt propelled w/c 50 ft with min A and verbal cues to use RLE to assist with propulsion. Pt ambulated 810ft x1, 15 ftx1, and 30 ftx1 with RW +2A. Manual facilitation for weight shift, advancement of LLE, and blocking L knee from buckling. Applied L toe-off AFO for second and third ambulation trial. Improved L foot clearance and slight improvement in knee stability noted with AFO. Pt required max encouragement to perform third gait trial and continued to perseverate on going to the bathroom. Pt moaned throughout ambulation but no c/o of pain. Returned pt to room, asked pt if he needed to use the bathroom before returning to bed. Pt became agitated, loudly vocalizing he had already gone. Total A verbal cues to problem solve need to perform hygiene and change brief. Pt transferred stand pivot w/c<>toilet mod A. Pt brief noted to be clean. Pt did not have BM while on toilet, some blood noted on toilet paper. Returned pt to bed, pt left in bed with needs in reach.    Therapy Documentation Precautions:  Precautions Precautions: Fall Restrictions Weight Bearing Restrictions: No General: Amount of Missed PT Time (min): 5 Minutes Pain:  Pt denies pain Locomotion : Ambulation Ambulation/Gait Assistance: 1: +2 Total assist Wheelchair Mobility Distance: 50   See FIM for current functional status  Therapy/Group: Individual Therapy  Nesta Scaturro 03/10/2013, 12:36 PM

## 2013-03-10 NOTE — Progress Notes (Signed)
Pt. Woke up c/o chest pain. Called on call Deatra Inaan Angiulli, PA received order for EKG no cardiac enyzmes at this time. EKG states NSR. MD to review in AM. Will continue to monitor.

## 2013-03-10 NOTE — Progress Notes (Signed)
Social Work Patient ID: Eric May, male   DOB: 29-Apr-1960, 53 y.o.   MRN: [id number] Met with pt and girlfriend-[name] to inform of team conference progression toward goals and will begin bed search.  [name] has not heard from [facility] regarding Applying for SSD.  This worker will [facility] regarding the hold up.  [name] is trying to order pt's meals for him so he eats better.  Await of number and will call to order From kitchen for pt.  Will begin bed search, pt will be difficult to place due to self pay and young age with his drug history.

## 2013-03-10 NOTE — Progress Notes (Signed)
Speech Language Pathology Weekly Progress Note  Patient Details  Name: Eric May MRN: [id number] Date of Birth: [birth year]  Today's Date: 03/10/2013  Short Term Goals: Week 1: SLP Short Term Goal 1 (Week 1): Patient will sustain attention to basic self-care task for 1 minute with Max clinician cues. SLP Short Term Goal 1 - Progress (Week 1): Met SLP Short Term Goal 2 (Week 1): Patient will solve basic problems with Max verbal cues. SLP Short Term Goal 2 - Progress (Week 1): Progressing toward goal SLP Short Term Goal 3 (Week 1): Patient will label 2 physical and 1 cognitive deficit with Max questions cues. SLP Short Term Goal 3 - Progress (Week 1): Progressing toward goal SLP Short Term Goal 4 (Week 1): Patient will consume Dys.3 textures and thin liquids with Mod cues for safety to prevent overt s/s of aspiration.  SLP Short Term Goal 4 - Progress (Week 1): Met SLP Short Term Goal 5 (Week 1): Patient will utilize speech intelligibility strategies with Max clinician cues. SLP Short Term Goal 5 - Progress (Week 1): Met  Week 2: SLP Short Term Goal 1 (Week 2): Patient will sustain attention to basic self-care task for 5-8 minutes with Max clinician cues. SLP Short Term Goal 2 (Week 2): Patient will solve basic problems with Max verbal cues. SLP Short Term Goal 3 (Week 2): Patient will label 2 physical and 1 cognitive deficit with Max questions cues. SLP Short Term Goal 4 (Week 2): Patient will consume regular textures and thin liquids with Mod cues for safety to prevent overt s/s of aspiration.  SLP Short Term Goal 5 (Week 2): Patient will maintain topic of conversation for 3 turns with Max assist clinician cues  Weekly Progress Updates: Patient 3 out of 5 short term goals this reporting period due to gains in diet toleration, ability to clearly produce verbal expression, as well as his ability to sustain attention to basic self-care tasks for 1 minute increments.  Patient is progressing  toward solving basic problems, verbalizing awareness of current deficits and maintaining topic of conversation.  While he is making slow gains he continues to demonstrate significantly impaired cognitive-linguistic skills as well as dysphagia which continue to require Max-Total assist and as a result, skilled SLP services to maximize functional independence and reduce burden of care prior to discharge to SNF.      SLP Intensity: Minumum of 1-2 x/day, 30 to 90 minutes SLP Frequency: 5 out of 7 days SLP Duration/Estimated Length of Stay: SNF SLP Treatment/Interventions: Cognitive remediation/compensation;Cueing hierarchy;Dysphagia/aspiration precaution training;Environmental controls;Functional tasks;Internal/external aids;Patient/family education;Oral motor exercises;Therapeutic Activities  Carmelia Roller., CCC-SLP [phone]  [place] 03/10/2013, 8:31 AM

## 2013-03-10 NOTE — Progress Notes (Signed)
Speech Language Pathology Daily Session Note  Patient Details  Name: Gerre Pebblesnthony Oviatt MRN: [id number] Date of Birth: 30-Jan-1961  Today's Date: 03/10/2013 Time: 0105-0145 Time Calculation (min): 40 min  Short Term Goals: Week 2: SLP Short Term Goal 1 (Week 2): Patient will sustain attention to basic self-care task for 5-8 minutes with Max clinician cues. SLP Short Term Goal 2 (Week 2): Patient will solve basic problems with Max verbal cues. SLP Short Term Goal 3 (Week 2): Patient will label 2 physical and 1 cognitive deficit with Max questions cues. SLP Short Term Goal 4 (Week 2): Patient will consume regular textures and thin liquids with Mod cues for safety to prevent overt s/s of aspiration.  SLP Short Term Goal 5 (Week 2): Patient will maintain topic of conversation for 3 turns with Max assist clinician cues  Skilled Therapeutic Interventions: Skilled intervention focused on trials for regular diet using fried chicken brought in by family. Patient had prolonged mastication indicating and significant oral residue that he should remain on a D3 diet. No s/s of aspiration. Patient was verbally and physically agitated which impacted his participation.  He needed MAX verbal and tactile cues to sustain attention on his meal and total assist for safety.    FIM:  Comprehension Comprehension Mode: Auditory Comprehension: 2-Understands basic 25 - 49% of the time/requires cueing 51 - 75% of the time Expression Expression Mode: Verbal Expression: 2-Expresses basic 25 - 49% of the time/requires cueing 50 - 75% of the time. Uses single words/gestures. Social Interaction Social Interaction: 1-Interacts appropriately less than 25% of the time. May be withdrawn or combative. Problem Solving Problem Solving: 1-Solves basic less than 25% of the time - needs direction nearly all the time or does not effectively solve problems and may need a restraint for safety Memory Memory: 1-Recognizes or recalls less  than 25% of the time/requires cueing greater than 75% of the time FIM - Eating Eating Activity: 4: Helper occasionally scoops food on utensil  Pain Pain Assessment Pain Assessment: No/denies pain  Therapy/Group: Individual Therapy  Kirra Verga 03/10/2013, 4:08 PM

## 2013-03-10 NOTE — Progress Notes (Signed)
NUTRITION FOLLOW UP  Intervention:   1. Supplements; Resource Breeze po TID, each supplement provides 250 kcal and 9 grams of protein 2. General healthful diet; encourage intake of foods and beverages as able. RD to follow and assess for nutritional adequacy.   NUTRITION DIAGNOSIS:  Inadequate oral intake related to poor appetite as evidenced by PO intake 0-25% of meals.   Monitor:  1. Food/Beverage; pt meeting >/=90% estimated needs with tolerance.  2. Wt/wt change; monitor trends  Assessment:   Pt admitted with deconditioning r/t right temporoparietal ICH. Pt's intake has not improved.  Remains poor at 15% of meals.  MD started Megace today. Pt is refusing supplements.  Will d/c ensure and order Breeze.  Met with pt who is uncomfortable during visit.  States he is having pain "everytime I go to the bathroom".  Pt reports he doesn't like Ensure, but unable to state why. Encouraged intake.   Height: Ht Readings from Last 1 Encounters:  03/02/13 6' (1.829 m)    Weight Status:   Wt Readings from Last 1 Encounters:  03/02/13 132 lb 7.9 oz (60.1 kg)    Re-estimated needs:  Kcal: 2100-2280  Protein: 90-110g  Fluid: >2.1 L/day  Skin: intact  Diet Order: Dysphagia 3, thin   Intake/Output Summary (Last 24 hours) at 03/10/13 1626 Last data filed at 03/10/13 1300  Gross per 24 hour  Intake    560 ml  Output    470 ml  Net     90 ml    Last BM: 1/14   Labs:  No results found for this basename: NA, K, CL, CO2, BUN, CREATININE, CALCIUM, MG, PHOS, GLUCOSE,  in the last 168 hours  CBG (last 3)  No results found for this basename: GLUCAP,  in the last 72 hours  Scheduled Meds: . amLODipine  10 mg Oral Daily  . antiseptic oral rinse  15 mL Mouth Rinse BID  . feeding supplement (ENSURE COMPLETE)  237 mL Oral BID BM  . folic acid  1 mg Oral Daily  . gabapentin  100 mg Oral TID  . hydrochlorothiazide  25 mg Oral Daily  . levETIRAcetam  500 mg Oral BID  . lisinopril  2.5 mg  Oral QHS  . megestrol  400 mg Oral BID  . multivitamin with minerals  1 tablet Oral Daily  . pantoprazole  40 mg Oral Daily  . potassium chloride  20 mEq Oral Daily  . propranolol  20 mg Oral BID  . QUEtiapine  25 mg Oral QHS  . senna-docusate  2 tablet Oral BID  . thiamine  100 mg Oral Daily    Continuous Infusions:   Brynda Greathouse, MS RD LDN Clinical Inpatient Dietitian Pager: [phone] Weekend/After hours pager: [phone]

## 2013-03-10 NOTE — Patient Care Conference (Signed)
Inpatient RehabilitationTeam Conference and Plan of Care Update Date: 03/09/2013   Time: 11;00 Am    Patient Name: Eric May      Medical Record Number: [id number]  Date of Birth: 1960/04/08 Sex: Male         Room/Bed: 4W11C/4W11C-01 Payor Info: Payor: MEDICAID PENDING / Plan: MEDICAID PENDING / Product Type: *No Product type* /    Admitting Diagnosis: ICH  Admit Date/Time:  03/02/2013  6:21 PM Admission Comments: No comment available   Primary Diagnosis:  <principal problem not specified> Principal Problem: <principal problem not specified>  Patient Active Problem List   Diagnosis Date Noted  . ICH (intracerebral hemorrhage) 03/02/2013  . Convulsions/seizures 02/24/2013  . Intracerebral hemorrhage 02/23/2013    Expected Discharge Date:    Team Members Present: Physician leading conference: Dr. Claudette LawsAndrew Kirsteins Social Worker Present: Dossie DerBecky Rollin Kotowski, LCSW Nurse Present: Kennon PortelaJeanna Hicks, RN PT Present: Edman CircleAudra Hall, PT;Emily Marya AmslerParcell, PT OT Present: Mackie PaiKaren [place]ask[place], Suszanne ConnersT;Sarah Hoxie, OT SLP Present: Fae PippinMelissa Bowie, SLP PPS Coordinator present : Tora DuckMarie Noel, RN, CRRN     Current Status/Progress Goal Weekly Team Focus  Medical   condom cath at noc, intermittent  maintain medical stability  reduce perseveration   Bowel/Bladder   Incont. of bowel LBM 03/07/13, Condom cath   to be continent of bladder and bowel  Timed toileting q 2hrs   Swallow/Nutrition/ Hydration   Dys.3 textures and thin liquids   least erstrictive p.o. intake   increase partcipation and intake   ADL's   mod assist bathing, mod assist UB dressing, total assist LB dressing, min-max assist transfers.  Participation fluctuates based on pain and fatigue.  mod assist LB self-cares and transfers, min assist UB and grooming  participation in treatment sessions, LUE NM re-ed, self-care retraining   Mobility   Mod A bed mobility, min A transfers, +2 gait and stairs  Min A transfers and w/c propulsion, max A gait   Participation and attention, gait, standing balance   Communication   Mod assist   Min assist   increase attention and awareness   Safety/Cognition/ Behavioral Observations  Max-Total assist   Mod assist   increase attention, participation, awareness    Pain   No c/o pain, Vicodin 1 tab prn ordered if needed   <3  Assess for and treat pain with prn pain meds   Skin   No skin breakdown   No skin breakdown  Assess skin q shift for breakdown      *See Care Plan and progress notes for long and short-term goals.  Barriers to Discharge: no family    Possible Resolutions to Barriers:  ? SNF    Discharge Planning/Teaching Needs:  Pt's Mom and girlfreind are not able to provide 24 hr care-pt is NHP.  Work on disability and Medicaid applications      Team Discussion:  Participation inconsistent- pain and fatigue issues.  Becomes fixated on one issue of the day. QD his therapies, since not participating in three hours anyway  Revisions to Treatment Plan:  NHP-QD therapies   Continued Need for Acute Rehabilitation Level of Care: The patient requires daily medical management by a physician with specialized training in physical medicine and rehabilitation for the following conditions: Daily direction of a multidisciplinary physical rehabilitation program to ensure safe treatment while eliciting the highest outcome that is of practical value to the patient.: Yes Daily medical management of patient stability for increased activity during participation in an intensive rehabilitation regime.: Yes Daily analysis of laboratory  values and/or radiology reports with any subsequent need for medication adjustment of medical intervention for : Neurological problems;Other  Lucy Chris 03/10/2013, 8:51 AM

## 2013-03-10 NOTE — Progress Notes (Signed)
The skilled treatment note has been reviewed and SLP is in agreement. Rodricus Candelaria, M.A., CCC-SLP [phone]  

## 2013-03-10 NOTE — Progress Notes (Signed)
I have read and agree with attached treatment note.  Becky Earl Zellmer, PT 

## 2013-03-10 NOTE — Progress Notes (Signed)
53 y.o. right-handed male with history of hypertension, tobacco and alcohol abuse. Admitted 02/23/2013 when patient developed left-sided numbness and weakness while at work. Patient reports falling at that time due to weakness on the left. Blood pressure 194/117. CT of the head showed acute parenchymal hemorrhage over the right temporal parietal region measuring 4.9 x 3.2 cm with local mass effect and midline shift of 4 mm. Echocardiogram with ejection fraction 65% no valvular abnormalities. Carotid Dopplers with no ICA stenosis. MRI of the brain 02/24/2013 again showed intraparenchymal hematoma 5.2 x 3.7 x 3.3 cm with surrounding vasogenic edema. Neurosurgery Dr. Conchita ParisNundkumar consult advise conservative management with repeat serial cranial CT scans. Urine drug screen was positive for cocaine as well as marijuana. Maintained on Cardene drip for blood pressure control.  Diet advance to dysphagia 3 thin liquids 03/01/2013. Keppra for seizure prophylaxis  Subjective/Complaints: Poor appetite, family visiting, starting to assist with food choices.  Perseverates on bowel problems but RN notes indicate continent BM qam for the last 2 mornings Review of Systems - unable to obtain secondary to reduced level of alertness Objective: Vital Signs: Blood pressure 140/100, pulse 82, temperature 98.3 F (36.8 C), temperature source Oral, resp. rate 18, height 6' (1.829 m), weight 60.1 kg (132 lb 7.9 oz), SpO2 100.00%. No results found. No results found for this or any previous visit (from the past 72 hour(s)).    Constitutional: He appears lethargic, no distress. Sleeping upon my entry  HENT: poor dentition,  Head: Normocephalic. atraumatic Eyes:  Pupils EOMI, without nystagmus  Neck: Normal range of motion. Neck supple. No thyromegaly present.  Cardiovascular: Normal rate and regular rhythm. No murmurs  Respiratory: Effort normal and breath sounds normal. No respiratory distress. No wheezes, rales, rhonchi   GI: Soft. Bowel sounds are normal. He exhibits no distension.  Neurological:  Patient is lethargic but arousable. Oriented to name and year but not date or month. He did follow some simple commands.Loses attention quickly . 1/5 left shoulder but otherwise was 0/5. LLE was 2/5 with HF and KE and trace at the ankle.  Speech moderately dysarthric.  RUE 4/5 RLE 5/5 Skin: Skin is warm and dry.  Psychiatric:  Flat, lethargic MSK:  - SLR   Assessment/Plan: 1. 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 Physiatrist is providing close team supervision and 24 hour management of active medical problems listed below. Physiatrist and rehab team continue to assess barriers to discharge/monitor patient progress toward functional and medical goals. Discussed with family concern about intake FIM: FIM - Bathing Bathing Steps Patient Completed: Chest;Left Arm;Abdomen;Front perineal area;Right upper leg;Left upper leg Bathing: 3: Mod-Patient completes 5-7 [id number] 10 parts or 50-74%  FIM - Upper Body Dressing/Undressing Upper body dressing/undressing steps patient completed: Thread/unthread right sleeve of pullover shirt/dresss;Put head through opening of pull over shirt/dress;Thread/unthread right sleeve of front closure shirt/dress;Pull shirt over trunk Upper body dressing/undressing: 3: Mod-Patient completed 50-74% of tasks FIM - Lower Body Dressing/Undressing Lower body dressing/undressing steps patient completed:  (wears brief) Lower body dressing/undressing: 1: Total-Patient completed less than 25% of tasks  FIM - Toileting Toileting steps completed by patient: Performs perineal hygiene Toileting Assistive Devices: Grab bar or rail for support Toileting: 2: Max-Patient completed 1 of 3 steps  FIM - Diplomatic Services operational officerToilet Transfers Toilet Transfers Assistive  Devices: Grab bars Toilet Transfers: 3-To toilet/BSC: Mod A (lift or lower assist);3-From toilet/BSC: Mod  A (lift or lower assist)  FIM - Bed/Chair Transfer Bed/Chair Transfer Assistive Devices: Arm rests Bed/Chair Transfer: 4: Sit > Supine: Min A (steadying pt. > 75%/lift 1 leg);3: Chair or W/C > Bed: Mod A (lift or lower assist)  FIM - Locomotion: Wheelchair Distance: 50 Locomotion: Wheelchair: 2: Travels 50 - 149 ft with minimal assistance (Pt.>75%) FIM - Locomotion: Ambulation Locomotion: Ambulation Assistive Devices: Designer, industrial/product Ambulation/Gait Assistance: 1: +2 Total assist Locomotion: Ambulation: 1: Two helpers  Comprehension Comprehension Mode: Auditory Comprehension: 3-Understands basic 50 - 74% of the time/requires cueing 25 - 50%  of the time  Expression Expression Mode: Verbal Expression: 3-Expresses basic 50 - 74% of the time/requires cueing 25 - 50% of the time. Needs to repeat parts of sentences.  Social Interaction Social Interaction: 4-Interacts appropriately 75 - 89% of the time - Needs redirection for appropriate language or to initiate interaction.  Problem Solving Problem Solving: 1-Solves basic less than 25% of the time - needs direction nearly all the time or does not effectively solve problems and may need a restraint for safety  Memory Memory: 1-Recognizes or recalls less than 25% of the time/requires cueing greater than 75% of the time  Medical Problem List and Plan:  1. 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 DVT Prophylaxis/Anticoagulation: SCDs. Monitor for any signs of DVT  3. 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 Neuropsych: This patient is not capable of making decisions on his own behalf.  5. Seizure prophylaxis. Keppra 500 mg twice a day.  Monitor for any seizure activity  6. Hypertension. Norvasc 10 mg daily, hydrochlorothiazide 25 mg daily. Monitor with increased mobility  7. Dysphagia.[name] dysphagia 3 thin liquids. Followup speech therapy , trial megace for appetite also on Remeron 8. History of tobacco alcohol abuse. Urine drug screen positive cocaine and marijuana. Provide counseling as appropriate    LOS (Days) 8 A FACE TO FACE EVALUATION WAS PERFORMED  KIRSTEINS,ANDREW E 03/10/2013, 12:41 PM

## 2013-03-11 ENCOUNTER — Encounter (HOSPITAL_COMMUNITY): Payer: Self-pay

## 2013-03-11 ENCOUNTER — Inpatient Hospital Stay (HOSPITAL_COMMUNITY): Payer: Medicaid Other | Admitting: Occupational Therapy

## 2013-03-11 ENCOUNTER — Inpatient Hospital Stay (HOSPITAL_COMMUNITY): Payer: Medicaid Other | Admitting: Physical Therapy

## 2013-03-11 DIAGNOSIS — I69998 Other sequelae following unspecified cerebrovascular disease: Secondary | ICD-10-CM

## 2013-03-11 DIAGNOSIS — G8110 Spastic hemiplegia affecting unspecified side: Secondary | ICD-10-CM

## 2013-03-11 DIAGNOSIS — I619 Nontraumatic intracerebral hemorrhage, unspecified: Secondary | ICD-10-CM

## 2013-03-11 DIAGNOSIS — R209 Unspecified disturbances of skin sensation: Secondary | ICD-10-CM

## 2013-03-11 MED ORDER — PROPRANOLOL HCL 20 MG PO TABS
20.0000 mg | ORAL_TABLET | Freq: Three times a day (TID) | ORAL | Status: DC
Start: 2013-03-11 — End: 2013-03-12
  Administered 2013-03-11 – 2013-03-12 (×4): 20 mg via ORAL
  Filled 2013-03-11 (×6): qty 1

## 2013-03-11 MED ORDER — MIRTAZAPINE 7.5 MG PO TABS
7.5000 mg | ORAL_TABLET | Freq: Every day | ORAL | Status: DC
  Administered 2013-03-11: 7.5 mg via ORAL
  Filled 2013-03-11 (×3): qty 1

## 2013-03-11 MED ORDER — QUETIAPINE FUMARATE 50 MG PO TABS
50.0000 mg | ORAL_TABLET | Freq: Every day | ORAL | Status: DC
  Administered 2013-03-11 – 2013-04-09 (×28): 50 mg via ORAL
  Filled 2013-03-11 (×31): qty 1

## 2013-03-11 NOTE — Progress Notes (Signed)
SLP Cancellation Note  Patient Details Name: Eric May MRN: [id number] DOB: [birth year]   Cancelled treatment:        Patient missed 30 minutes of scheduled co-treatment with SLP/OT in [facility] due to refusal. Will continue plan of care as able.   Maxcine HamLaura Paiewonsky, M.A. CCC-SLP [phone]    Maxcine Hamaiewonsky, Atom Solivan 03/11/2013, 4:16 PM

## 2013-03-11 NOTE — Progress Notes (Signed)
Occupational Therapy Note  Patient Details  Name: Eric May MRN: [id number] Date of Birth: 1960-06-30 Today's Date: 03/11/2013  Pt missed 45 mins skilled OT treatment session secondary to refusal.  Pt in bed upon arrival with c/o fatigue and stomach pain.  Encouraged pt to attempt to get OOB to toilet or engage in familiar self-care tasks, however pt adamant about not getting OOB at this time.  Pt reports frustration with medications and his stomach being upset.  Reports "I just can't do it right now".  RN notified of refusal.  Will follow up as able.  [name], Eric May 03/11/2013, 9:57 AM

## 2013-03-11 NOTE — Progress Notes (Signed)
53 y.o. right-handed male with history of hypertension, tobacco and alcohol abuse. Admitted 02/23/2013 when patient developed left-sided numbness and weakness while at work. Patient reports falling at that time due to weakness on the left. Blood pressure 194/117. CT of the head showed acute parenchymal hemorrhage over the right temporal parietal region measuring 4.9 x 3.2 cm with local mass effect and midline shift of 4 mm. Echocardiogram with ejection fraction 65% no valvular abnormalities. Carotid Dopplers with no ICA stenosis. MRI of the brain 02/24/2013 again showed intraparenchymal hematoma 5.2 x 3.7 x 3.3 cm with surrounding vasogenic edema. Neurosurgery Dr. Conchita ParisNundkumar consult advise conservative management with repeat serial cranial CT scans. Urine drug screen was positive for cocaine as well as marijuana. Maintained on Cardene drip for blood pressure control.  Diet advance to dysphagia 3 thin liquids 03/01/2013. Keppra for seizure prophylaxis  Subjective/Complaints: "I can't do this"   Perseverates on bowel problems but RN notes indicate continent BM qam for the last 2 mornings Oriented to person and place Review of Systems - unable to obtain secondary to reduced level of alertness Objective: Vital Signs: Blood pressure 137/99, pulse 77, temperature 99 F (37.2 C), temperature source Oral, resp. rate 18, height 6' (1.829 m), weight 60.1 kg (132 lb 7.9 oz), SpO2 99.00%. No results found. No results found for this or any previous visit (from the past 72 hour(s)).    Constitutional: He appears lethargic, no distress. Sleeping upon my entry  HENT: poor dentition,  Head: Normocephalic. atraumatic Eyes:  Pupils EOMI, without nystagmus  Neck: Normal range of motion. Neck supple. No thyromegaly present.  Cardiovascular: Normal rate and regular rhythm. No murmurs  Respiratory: Effort normal and breath sounds normal. No respiratory distress. No wheezes, rales, rhonchi  GI: Soft. Bowel sounds are  normal. He exhibits no distension.  Neurological:  Patient is lethargic but arousable. Oriented to name and year but not date or month. He did follow some simple commands.Loses attention quickly . 1/5 left shoulder but otherwise was 0/5. LLE was 2/5 with HF and KE and trace at the ankle.  Speech moderately dysarthric.  RUE 4/5 RLE 5/5 Skin: Skin is warm and dry.  Psychiatric:  Flat, lethargic MSK:  - SLR   Assessment/Plan: 1. 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 Physiatrist is providing close team supervision and 24 hour management of active medical problems listed below. Physiatrist and rehab team continue to assess barriers to discharge/monitor patient progress toward functional and medical goals.  FIM: FIM - Bathing Bathing Steps Patient Completed: Chest;Left Arm;Abdomen;Front perineal area;Right upper leg;Left upper leg Bathing: 3: Mod-Patient completes 5-7 187f 10 parts or 50-74%  FIM - Upper Body Dressing/Undressing Upper body dressing/undressing steps patient completed: Thread/unthread right sleeve of pullover shirt/dresss;Put head through opening of pull over shirt/dress;Thread/unthread right sleeve of front closure shirt/dress;Pull shirt over trunk Upper body dressing/undressing: 3: Mod-Patient completed 50-74% of tasks FIM - Lower Body Dressing/Undressing Lower body dressing/undressing steps patient completed:  (wears brief) Lower body dressing/undressing: 1: Total-Patient completed less than 25% of tasks  FIM - Toileting Toileting steps completed by patient: Performs perineal hygiene Toileting Assistive Devices: Grab bar or rail for support Toileting: 2: Max-Patient completed 1 of 3 steps  FIM - Diplomatic Services operational officerToilet Transfers Toilet Transfers Assistive Devices: Grab bars Toilet Transfers: 3-To toilet/BSC: Mod A (lift or  lower assist);3-From toilet/BSC: Mod A (lift or lower assist)  FIM - Banker Devices: Arm rests Bed/Chair Transfer: 4: Sit > Supine: Min A (steadying pt. > 75%/lift 1 leg);3: Chair or W/C > Bed: Mod A (lift or lower assist)  FIM - Locomotion: Wheelchair Distance: 50 Locomotion: Wheelchair: 2: Travels 50 - 149 ft with minimal assistance (Pt.>75%) FIM - Locomotion: Ambulation Locomotion: Ambulation Assistive Devices: Designer, industrial/product Ambulation/Gait Assistance: 1: +2 Total assist Locomotion: Ambulation: 1: Two helpers  Comprehension Comprehension Mode: Auditory Comprehension: 3-Understands basic 50 - 74% of the time/requires cueing 25 - 50%  of the time  Expression Expression Mode: Verbal Expression: 3-Expresses basic 50 - 74% of the time/requires cueing 25 - 50% of the time. Needs to repeat parts of sentences.  Social Interaction Social Interaction: 3-Interacts appropriately 50 - 74% of the time - May be physically or verbally inappropriate.  Problem Solving Problem Solving: 2-Solves basic 25 - 49% of the time - needs direction more than half the time to initiate, plan or complete simple activities  Memory Memory: 2-Recognizes or recalls 25 - 49% of the time/requires cueing 51 - 75% of the time  Medical Problem List and Plan:  1. 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 DVT Prophylaxis/Anticoagulation: SCDs. Monitor for any signs of DVT  3. 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 Neuropsych: This patient is not capable of making decisions on his own behalf.  5. Seizure prophylaxis. Keppra 500 mg twice a day. Monitor for any seizure activity  6. Hypertension. Norvasc 10 mg daily, hydrochlorothiazide 25 mg daily. Monitor with increased mobility  7.  Dysphagia.[name] dysphagia 3 thin liquids. Followup speech therapy , trial megace for appetite also on Remeron 8. History of tobacco alcohol abuse. Urine drug screen positive cocaine and marijuana. Provide counseling as appropriate    LOS (Days) 9 A FACE TO FACE EVALUATION WAS PERFORMED  Graelyn Bihl E 03/11/2013, 8:29 AM

## 2013-03-11 NOTE — Progress Notes (Signed)
Occupational Therapy Weekly Progress Note  Patient Details  Name: Eric May MRN: [id number] Date of Birth: 01/20/1961  Today's Date: 03/11/2013  Patient has met 3 of 5 short term goals.  Pt is making slow but steady progress towards goals.  Pt is significantly limited by decreased motivation, pain, and perseveration on bowels and participation in therapy.  Pt is currently min-mod assist with basic transfers depending on level of motivation and attention. Pt has movement in LUE but decreased attention and awareness.  Patient continues to demonstrate the following deficits: Lt hemiparesis, impaired balance, impaired coordination, Lt inattention, impaired safety awareness, impaired attention, cognitive deficits and therefore will continue to benefit from skilled OT intervention to enhance overall performance with BADL and Reduce care partner burden.  Patient progressing toward long term goals..  Continue plan of care.  OT Short Term Goals Week 1:  OT Short Term Goal 1 (Week 1): Pt will complete toilet transfer with max assist 3 consecutive sessions OT Short Term Goal 1 - Progress (Week 1): Met OT Short Term Goal 2 (Week 1): Pt will complete bathing with mod assist  OT Short Term Goal 2 - Progress (Week 1): Met OT Short Term Goal 3 (Week 1): Pt will complete UB dressing with min assist OT Short Term Goal 3 - Progress (Week 1): Progressing toward goal OT Short Term Goal 4 (Week 1): Pt will complete LB dressing with max assist OT Short Term Goal 4 - Progress (Week 1): Progressing toward goal OT Short Term Goal 5 (Week 1): Pt will scan to Lt environment to locate items for self-care with mod verbal cues OT Short Term Goal 5 - Progress (Week 1): Met Week 2:  OT Short Term Goal 1 (Week 2): Pt will complete UB dressing with min assist OT Short Term Goal 2 (Week 2): Pt will complete LB dressing with max assist OT Short Term Goal 3 (Week 2): Pt will complete bathing with min assist and min verbal  cues for initiation and sequencing. OT Short Term Goal 4 (Week 2): Pt will complete toilet transfer with mod assist 3 consecutive sessions. OT Short Term Goal 5 (Week 2): Pt will complete shower transfer with mod assist.  Therapy Documentation Precautions:  Precautions Precautions: Fall Restrictions Weight Bearing Restrictions: No  See FIM for current functional status  Eric May, [facility] 03/11/2013, 10:00 AM

## 2013-03-11 NOTE — Progress Notes (Signed)
I have reviewed and I agree with the following treatment note.  Smayan Hackbart Hall, PT, DPT  

## 2013-03-11 NOTE — Progress Notes (Signed)
Physical Therapy Session Note  Patient Details  Name: Gerre Pebblesnthony Blakeney MRN: [id number] Date of Birth: 04-15-1960  Today's Date: 03/11/2013 Time: [id number] Time Calculation (min): 45 min  Skilled Therapeutic Interventions/Progress Updates:    Pt received lying in bed. Pt lethargic this session with slower initiation of movement. Decreased sitting balance as well as loss of saliva through L side of mouth also noted. Pt agreeable to sitting EOB to don shorts and shirt. Pt donned shorts sit to stand level with mod A for balance. Pt able to thread UE through shirt with max verbal cues to use RUE to assist LUE. Pt transferred bed>w/c mod A. Pt began to perseverate on medications and bowels, difficult to refocus. With max encouragement, pt agreeable to continue and selected playing game of checkers out of three options for therapy session. Propelled pt to therapy gym. Sit>stand at high-low table with +2A to maintain standing balance while reaching for checker pieces. Pt able to maintain attention to game ~30 seconds. Transitioned to moving pieces on checker board with LUE. Pt became agitated, stating "I have had enough. I'm through, I'm through." Returned pt to room, pt transferred w/c>bed mod A and sit>supine mod A. Pt left in bed with needs in reach.   Therapy Documentation Precautions:  Precautions Precautions: Fall Restrictions Weight Bearing Restrictions: No Pain: No c/o pain  See FIM for current functional status  Therapy/Group: Individual Therapy  Latrisha Coiro 03/11/2013, 12:17 PM

## 2013-03-12 ENCOUNTER — Inpatient Hospital Stay (HOSPITAL_COMMUNITY): Payer: Medicaid Other | Admitting: Physical Therapy

## 2013-03-12 LAB — BASIC METABOLIC PANEL
BUN: 33 mg/dL — ABNORMAL HIGH (ref 6–23)
CO2: 24 mEq/L (ref 19–32)
Calcium: 10.3 mg/dL (ref 8.4–10.5)
Chloride: 90 mEq/L — ABNORMAL LOW (ref 96–112)
Creatinine, Ser: 1.34 mg/dL (ref 0.50–1.35)
GFR calc Af Amer: 70 mL/min — ABNORMAL LOW (ref >90)
GFR calc non Af Amer: 60 mL/min — ABNORMAL LOW (ref >90)
GLUCOSE: 102 mg/dL — AB (ref 70–99)
POTASSIUM: 4.8 meq/L (ref 3.7–5.3)
SODIUM: 130 meq/L — AB (ref 137–147)

## 2013-03-12 LAB — CBC
HEMATOCRIT: 48.1 % (ref 39.0–52.0)
Hemoglobin: 16.8 g/dL (ref 13.0–17.0)
MCH: 30.5 pg (ref 26.0–34.0)
MCHC: 34.9 g/dL (ref 30.0–36.0)
MCV: 87.5 fL (ref 78.0–100.0)
Platelets: 328 10*3/uL (ref 150–400)
RBC: 5.5 MIL/uL (ref 4.22–5.81)
RDW: 12.9 % (ref 11.5–15.5)
WBC: 9.2 10*3/uL (ref 4.0–10.5)

## 2013-03-12 LAB — GLUCOSE, CAPILLARY: GLUCOSE-CAPILLARY: 99 mg/dL (ref 70–99)

## 2013-03-12 MED ORDER — PROPRANOLOL HCL 10 MG PO TABS
10.0000 mg | ORAL_TABLET | Freq: Three times a day (TID) | ORAL | Status: DC
  Administered 2013-03-13: 10 mg via ORAL
  Filled 2013-03-12 (×6): qty 1

## 2013-03-12 MED ORDER — SODIUM CHLORIDE 0.9 % IV SOLN
INTRAVENOUS | Status: DC
  Administered 2013-03-12 – 2013-03-16 (×2): via INTRAVENOUS

## 2013-03-12 MED ORDER — SODIUM CHLORIDE 0.45 % IV SOLN
INTRAVENOUS | Status: DC
  Administered 2013-03-12: 16:00:00 via INTRAVENOUS

## 2013-03-12 NOTE — Progress Notes (Signed)
PT called RN into room to assess pt. Pt was in bathroom with PT and had BM. According to PT, pt stated he felt cold, and became very sweaty and minimally responsive. Attempted to to check vitals with pt in w/c, unable to obtain manually, systolic sounded like it was in the 60s-70s. Assisted pt back to bed and rapid response came to room to assess. Pt remained minimally responsive for approximately 10 min while IV was started. At this point, pt began to return to his baseline. BP 119/88, HR 71, RR 16, O2 98% on RA, temp 97.8. Notified MD and received orders for IVF and labs. Pt resting at this time, perseverating on BM and pain as usual. Continue to monitor. Mick SellShannon Radwan Cowley RN

## 2013-03-12 NOTE — Plan of Care (Signed)
Problem: RH BLADDER ELIMINATION Goal: RH STG MANAGE BLADDER WITH ASSISTANCE STG Manage Bladder With supervision  Outcome: Not Progressing [facility]

## 2013-03-12 NOTE — Progress Notes (Signed)
53 y.o. right-handed male with history of hypertension, tobacco and alcohol abuse. Admitted 02/23/2013 when patient developed left-sided numbness and weakness while at work. Patient reports falling at that time due to weakness on the left. Blood pressure 194/117. CT of the head showed acute parenchymal hemorrhage over the right temporal parietal region measuring 4.9 x 3.2 cm with local mass effect and midline shift of 4 mm. Echocardiogram with ejection fraction 65% no valvular abnormalities. Carotid Dopplers with no ICA stenosis. MRI of the brain 02/24/2013 again showed intraparenchymal hematoma 5.2 x 3.7 x 3.3 cm with surrounding vasogenic edema. Neurosurgery Dr. Conchita Paris consult advise conservative management with repeat serial cranial CT scans. Urine drug screen was positive for cocaine as well as marijuana. Maintained on Cardene drip for blood pressure control.  Diet advance to dysphagia 3 thin liquids 03/01/2013. Keppra for seizure prophylaxis  Subjective/Complaints: BM recorded qd Appears more calm this am Oriented to person and place Review of Systems - unable to obtain secondary to reduced level of alertness Objective: Vital Signs: Blood pressure 114/82, pulse 82, temperature 98.2 F (36.8 C), temperature source Oral, resp. rate 17, height 6' (1.829 m), weight 60.1 kg (132 lb 7.9 oz), SpO2 97.00%. No results found. No results found for this or any previous visit (from the past 72 hour(s)).    Constitutional: He appears lethargic, no distress. Sleeping upon my entry  HENT: poor dentition,  Head: Normocephalic. atraumatic Eyes:  Pupils EOMI, without nystagmus  Neck: Normal range of motion. Neck supple. No thyromegaly present.  Cardiovascular: Normal rate and regular rhythm. No murmurs  Respiratory: Effort normal and breath sounds normal. No respiratory distress. No wheezes, rales, rhonchi  GI: Soft. Bowel sounds are normal. He exhibits no distension.  Neurological:  Patient is  lethargic but arousable. Oriented to name and year but not date or month. He did follow some simple commands.Loses attention quickly . 1/5 left shoulder ,grip was 2-/5. LLE was 2/5 with HF and KE and trace at the ankle.  Speech moderately dysarthric.  RUE 4/5 RLE 5/5 Skin: Skin is warm and dry.  Psychiatric:  Flat, lethargic MSK:  - SLR   Assessment/Plan: 1. 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 Physiatrist is providing close team supervision and 24 hour management of active medical problems listed below. Physiatrist and rehab team continue to assess barriers to discharge/monitor patient progress toward functional and medical goals.  FIM: FIM - Bathing Bathing Steps Patient Completed: Chest;Left Arm;Abdomen;Front perineal area;Right upper leg;Left upper leg Bathing: 3: Mod-Patient completes 5-7 61f 10 parts or 50-74%  FIM - Upper Body Dressing/Undressing Upper body dressing/undressing steps patient completed: Thread/unthread right sleeve of pullover shirt/dresss;Put head through opening of pull over shirt/dress;Thread/unthread right sleeve of front closure shirt/dress;Pull shirt over trunk Upper body dressing/undressing: 3: Mod-Patient completed 50-74% of tasks FIM - Lower Body Dressing/Undressing Lower body dressing/undressing steps patient completed:  (wears brief) Lower body dressing/undressing: 1: Total-Patient completed less than 25% of tasks  FIM - Toileting Toileting steps completed by patient: Performs perineal hygiene Toileting Assistive Devices: Grab bar or rail for support Toileting: 2: Max-Patient completed 1 of 3 steps  FIM - Diplomatic Services operational officer Devices: Grab bars Toilet Transfers: 3-To toilet/BSC: Mod A (lift or lower assist);3-From toilet/BSC: Mod A (lift or lower assist)  FIM -  Bed/Chair Transfer Bed/Chair Transfer Assistive Devices: Arm rests Bed/Chair Transfer: 4: Supine >  Sit: Min A (steadying Pt. > 75%/lift 1 leg);3: Sit > Supine: Mod A (lifting assist/Pt. 50-74%/lift 2 legs);3: Bed > Chair or W/C: Mod A (lift or lower assist);3: Chair or W/C > Bed: Mod A (lift or lower assist)  FIM - Locomotion: Wheelchair Distance: 50 Locomotion: Wheelchair: 1: Total Assistance/staff pushes wheelchair (Pt<25%) FIM - Locomotion: Ambulation Locomotion: Ambulation Assistive Devices: Designer, industrial/productWalker - Rolling Ambulation/Gait Assistance: 1: +2 Total assist Locomotion: Ambulation: 0: Activity did not occur  Comprehension Comprehension Mode: Auditory Comprehension: 3-Understands basic 50 - 74% of the time/requires cueing 25 - 50%  of the time  Expression Expression Mode: Verbal Expression: 3-Expresses basic 50 - 74% of the time/requires cueing 25 - 50% of the time. Needs to repeat parts of sentences.  Social Interaction Social Interaction: 3-Interacts appropriately 50 - 74% of the time - May be physically or verbally inappropriate.  Problem Solving Problem Solving: 2-Solves basic 25 - 49% of the time - needs direction more than half the time to initiate, plan or complete simple activities  Memory Memory: 2-Recognizes or recalls 25 - 49% of the time/requires cueing 51 - 75% of the time  Medical Problem List and Plan:  1. 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 DVT Prophylaxis/Anticoagulation: SCDs. Monitor for any signs of DVT  3. 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 Neuropsych: This patient is not capable of making decisions on his own behalf.  5. Seizure prophylaxis. Keppra 500 mg twice a day. Monitor for any seizure activity  6. Hypertension. Norvasc 10 mg daily, hydrochlorothiazide  25 mg daily. Monitor with increased mobility  7. Dysphagia.[name]. dysphagia 3 thin liquids. Followup speech therapy , trial megace for appetite also on Remeron 8. History of tobacco alcohol abuse. Urine drug screen positive cocaine and marijuana. Provide counseling as appropriate    LOS (Days) 10 A FACE TO FACE EVALUATION WAS PERFORMED  KIRSTEINS,ANDREW E 03/12/2013, 9:33 AM

## 2013-03-12 NOTE — Significant Event (Signed)
Rapid Response Event Note  Overview: Time Called: 1529 Arrival Time: 1529 Event Type: Neurologic;Hypotension  Initial Focused Assessment: Patient working with PT, assisted to BR.  Patient had a BM Patient became suddenly unresponsive, cold, clamy Initially unable to obtain a BP, then SBP 40. CBG 99   Interventions: Assisted to bed Left AC NSL started Patient became more responsive, neurologically returned to baseline per staff No new neuro deficits noted BP 119/88 HR 82 RR 17  O2 sat 97% RN to call if assistance needed RN notified attending, will start IV fluids  Event Summary: Name of Physician Notified: kirsteins at 1600    at    Outcome: Stayed in room and stabalized  Event End Time: 1620  Eric May, Eric May

## 2013-03-12 NOTE — Progress Notes (Addendum)
Physical Therapy Session Note  Patient Details  Name: Gerre Pebblesnthony Prashad MRN: [id number] Date of Birth: 1960-07-24  Today's Date: 03/12/2013 Time: [id number] Time Calculation (min): 45 min  Therapy Documentation Precautions:  Precautions Precautions: Fall Restrictions Weight Bearing Restrictions: No Pain: c/o pain (unrated) when having BM's  Therapeutic Activity:(45')  Patient resting in bed and states that he needs to use bathroom for BM. Bed mobility rolling Left side with min-assist with patient using bedrail, Left side lying to sitting Max-assist, transfers bed->w/c with min-assist via stand-pivot transfer, toilet transfers with patient using grab bar to assist with min-assist wc<->toilet and dependent for pulling pants down and back up (Rehab tech present to assist throughout tx session) . Patient able to perform hygiene post BM but needed further assist to ensure good cleanup. Patient transferred back to w/c and then reported feeling like he had a cold sweat. Patient was wheeled from bathroom to sink for hand washing and at that point became increasingly lethargic. Nursing and Rapid Response team were notified and vitals were taken (see nursing report for details). Patient was transferred BTB via squat-pivot transfer with PT blocking knees and with Max-to-Total assist.  Patient became increasingly responsive to baseline.  See FIM for current functional status  Therapy/Group: Individual Therapy  Alphonsa Brickle J 03/12/2013, 3:08 PM

## 2013-03-13 ENCOUNTER — Inpatient Hospital Stay (HOSPITAL_COMMUNITY): Payer: Medicaid Other

## 2013-03-13 ENCOUNTER — Ambulatory Visit (HOSPITAL_COMMUNITY): Payer: Self-pay | Admitting: *Deleted

## 2013-03-13 MED ORDER — DOCUSATE SODIUM 100 MG PO CAPS
200.0000 mg | ORAL_CAPSULE | Freq: Two times a day (BID) | ORAL | Status: DC
  Filled 2013-03-13 (×2): qty 2

## 2013-03-13 MED ORDER — PROPRANOLOL HCL 20 MG PO TABS
20.0000 mg | ORAL_TABLET | Freq: Three times a day (TID) | ORAL | Status: DC
  Administered 2013-03-13 – 2013-04-14 (×95): 20 mg via ORAL
  Filled 2013-03-13 (×100): qty 1

## 2013-03-13 MED ORDER — MIRTAZAPINE 15 MG PO TABS
15.0000 mg | ORAL_TABLET | Freq: Every day | ORAL | Status: DC
  Administered 2013-03-14 – 2013-04-13 (×31): 15 mg via ORAL
  Filled 2013-03-13 (×40): qty 1

## 2013-03-13 MED ORDER — ALPRAZOLAM 0.5 MG PO TABS
0.5000 mg | ORAL_TABLET | Freq: Three times a day (TID) | ORAL | Status: DC | PRN
  Administered 2013-03-14 – 2013-04-06 (×13): 0.5 mg via ORAL
  Filled 2013-03-13 (×14): qty 1

## 2013-03-13 MED ORDER — HYDROCORTISONE ACETATE 25 MG RE SUPP
25.0000 mg | Freq: Every day | RECTAL | Status: DC | PRN
  Administered 2013-03-13: 25 mg via RECTAL
  Filled 2013-03-13 (×2): qty 1

## 2013-03-13 MED ORDER — BISACODYL 10 MG RE SUPP
10.0000 mg | Freq: Once | RECTAL | Status: AC
  Administered 2013-03-13: 10 mg via RECTAL
  Filled 2013-03-13: qty 1

## 2013-03-13 MED ORDER — HYDROCORTISONE ACETATE 25 MG RE SUPP: 25.0000 mg | Freq: Two times a day (BID) | RECTAL | Status: DC

## 2013-03-13 MED ORDER — SORBITOL 70 % SOLN
960.0000 mL | TOPICAL_OIL | Freq: Once | ORAL | Status: DC
  Filled 2013-03-13: qty 240

## 2013-03-13 MED ORDER — SENNOSIDES-DOCUSATE SODIUM 8.6-50 MG PO TABS: 2.0000 | ORAL_TABLET | Freq: Two times a day (BID) | ORAL | Status: DC

## 2013-03-13 MED ORDER — SENNOSIDES-DOCUSATE SODIUM 8.6-50 MG PO TABS
3.0000 | ORAL_TABLET | Freq: Two times a day (BID) | ORAL | Status: DC
  Administered 2013-03-14 – 2013-03-28 (×27): 3 via ORAL
  Filled 2013-03-13 (×14): qty 3
  Filled 2013-03-13: qty 1
  Filled 2013-03-13 (×6): qty 3
  Filled 2013-03-13: qty 1
  Filled 2013-03-13 (×7): qty 3

## 2013-03-13 NOTE — Plan of Care (Signed)
Problem: RH COGNITION-NURSING Goal: RH STG ANTICIPATES NEEDS/CALLS FOR ASSIST W/ASSIST/CUES STG Anticipates Needs/Calls for Assist With min Assistance/Cues.  Outcome: Not Progressing Pt states he is in pain but refuses to take pain medication at times (and other meds as well).

## 2013-03-13 NOTE — Plan of Care (Signed)
Problem: RH BOWEL ELIMINATION Goal: RH STG MANAGE BOWEL W/MEDICATION W/ASSISTANCE STG Manage Bowel with Medication with mod Assistance.  Outcome: Not Progressing Incont BM this AM. Constantly complains of needing to have a BM.

## 2013-03-13 NOTE — Progress Notes (Addendum)
Pt assisted to bathroom earlier in shift and a small amount of blood was noted in his diaper. Pt also passed a dime sized blood clot into the toilet. Abd xray results showing a fecal impaction. MD notified and received orders to give a ducolax suppository now, colace 200mg  BID, and CBC in the AM. Explained condition to pt and his girlfriend and pt agreeable to take suppository now, given at 1755. Pt tolerated well. No stool felt in the rectum when suppository was given. Pt positioned on left side and told to call when he feels like he needs to have a BM. Continue to monitor. Mick SellShannon Hanan Moen RN

## 2013-03-13 NOTE — Progress Notes (Signed)
Pt constantly yelling out that he is in pain (in the rectal area), agitated and using profanity. Noted to be incont of small loose stool in brief. Performed perineal hygiene, pt moaning and grimacing when gently wiped around rectal area. Applied barrier cream per pt request. Offered pt pain medicine, pt states "I ain't taking that s**t until I know what the f**k is going on". Attempted to calm pt and explain that I am trying to give him pain medicine to relieve his pain, but pt continues to refuse to take medicine. Continues to use profane language and show no understanding of what we are trying to do. Left room to allow pt to calm down, belongings in reach. Continue to monitor. Mick SellShannon Insiya Oshea RN

## 2013-03-13 NOTE — Progress Notes (Addendum)
53 y.o. right-handed male with history of hypertension, tobacco and alcohol abuse. Admitted 02/23/2013 when patient developed left-sided numbness and weakness while at work. Patient reports falling at that time due to weakness on the left. Blood pressure 194/117. CT of the head showed acute parenchymal hemorrhage over the right temporal parietal region measuring 4.9 x 3.2 cm with local mass effect and midline shift of 4 mm. Echocardiogram with ejection fraction 65% no valvular abnormalities. Carotid Dopplers with no ICA stenosis. MRI of the brain 02/24/2013 again showed intraparenchymal hematoma 5.2 x 3.7 x 3.3 cm with surrounding vasogenic edema. Neurosurgery Dr. Kathyrn Sheriff consult advise conservative management with repeat serial cranial CT scans. Urine drug screen was positive for cocaine as well as marijuana. Maintained on Cardene drip for blood pressure control.  Diet advance to dysphagia 3 thin liquids 03/01/2013. Keppra for seizure prophylaxis  Subjective/Complaints: Pt states "I am in pain, my ass" Pt oriented to self only Review of Systems - unable to obtain secondary to reduced level of alertness Objective: Vital Signs: Blood pressure 123/92, pulse 90, temperature 97.2 F (36.2 C), temperature source Oral, resp. rate 19, height 6' (1.829 m), weight 60.1 kg (132 lb 7.9 oz), SpO2 100.00%. No results found. Results for orders placed during the hospital encounter of 03/02/13 (from the past 72 hour(s))  GLUCOSE, CAPILLARY     Status: None   Collection Time    03/12/13  3:34 PM      Result Value Range   Glucose-Capillary 99  70 - 99 mg/dL   Comment 1 Notify RN    CBC     Status: None   Collection Time    03/12/13  5:09 PM      Result Value Range   WBC 9.2  4.0 - 10.5 K/uL   RBC 5.50  4.22 - 5.81 MIL/uL   Hemoglobin 16.8  13.0 - 17.0 g/dL   HCT 48.1  39.0 - 52.0 %   MCV 87.5  78.0 - 100.0 fL   MCH 30.5  26.0 - 34.0 pg   MCHC 34.9  30.0 - 36.0 g/dL   RDW 12.9  11.5 - 15.5 %    Platelets 328  150 - 400 K/uL  BASIC METABOLIC PANEL     Status: Abnormal   Collection Time    03/12/13  5:09 PM      Result Value Range   Sodium 130 (*) 137 - 147 mEq/L   Potassium 4.8  3.7 - 5.3 mEq/L   Chloride 90 (*) 96 - 112 mEq/L   CO2 24  19 - 32 mEq/L   Glucose, Bld 102 (*) 70 - 99 mg/dL   BUN 33 (*) 6 - 23 mg/dL   Creatinine, Ser 1.34  0.50 - 1.35 mg/dL   Calcium 10.3  8.4 - 10.5 mg/dL   GFR calc non Af Amer 60 (*) >90 mL/min   GFR calc Af Amer 70 (*) >90 mL/min   Comment: (NOTE)     The eGFR has been calculated using the CKD EPI equation.     This calculation has not been validated in all clinical situations.     eGFR's persistently <90 mL/min signify possible Chronic Kidney     Disease.      Constitutional: He appears alert, no distress. Resting in bed quietly until I entered room HENT: poor dentition,  Head: Normocephalic. atraumatic Eyes:  Pupils EOMI, without nystagmus  Neck: Normal range of motion. Neck supple. No thyromegaly present.  Cardiovascular: Normal rate and regular  rhythm. No murmurs  Respiratory: Effort normal and breath sounds normal. No respiratory distress. No wheezes, rales, rhonchi  GI: Soft. Bowel sounds are normal. He exhibits no distension.  Neurological:  . Oriented to name and hosp. He did follow some simple commands.Loses attention quickly . 1/5 left shoulder ,grip was 2-/5. LLE was 2/5 with HF and KE and trace at the ankle.  Speech moderately dysarthric.  RUE 4/5 RLE 5/5 Skin: Skin is warm and dry.  Psychiatric:  Flat, lethargic MSK:  - SLR  Rectal- no external hemorrhoids, good sphincter tone, didn't tolerate rectal exam beyond anal verge, no masses palpated  Assessment/Plan: 1. 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 Physiatrist is providing close team supervision and  24 hour management of active medical problems listed below. Physiatrist and rehab team continue to assess barriers to discharge/monitor patient progress toward functional and medical goals.  FIM: FIM - Bathing Bathing Steps Patient Completed: Chest;Left Arm;Abdomen;Front perineal area;Right upper leg;Left upper leg Bathing: 3: Mod-Patient completes 5-7 1f10 parts or 50-74%  FIM - Upper Body Dressing/Undressing Upper body dressing/undressing steps patient completed: Thread/unthread right sleeve of pullover shirt/dresss;Put head through opening of pull over shirt/dress;Thread/unthread right sleeve of front closure shirt/dress;Pull shirt over trunk Upper body dressing/undressing: 3: Mod-Patient completed 50-74% of tasks FIM - Lower Body Dressing/Undressing Lower body dressing/undressing steps patient completed:  (wears brief) Lower body dressing/undressing: 1: Total-Patient completed less than 25% of tasks  FIM - Toileting Toileting steps completed by patient: Performs perineal hygiene Toileting Assistive Devices: Grab bar or rail for support Toileting: 2: Max-Patient completed 1 of 3 steps  FIM - TRadio producerDevices: Grab bars Toilet Transfers: 3-To toilet/BSC: Mod A (lift or lower assist);3-From toilet/BSC: Mod A (lift or lower assist)  FIM - Bed/Chair Transfer Bed/Chair Transfer Assistive Devices: Arm rests Bed/Chair Transfer: 4: Supine > Sit: Min A (steadying Pt. > 75%/lift 1 leg);3: Sit > Supine: Mod A (lifting assist/Pt. 50-74%/lift 2 legs);3: Bed > Chair or W/C: Mod A (lift or lower assist);3: Chair or W/C > Bed: Mod A (lift or lower assist)  FIM - Locomotion: Wheelchair Distance: 50 Locomotion: Wheelchair: 1: Total Assistance/staff pushes wheelchair (Pt<25%) FIM - Locomotion: Ambulation Locomotion: Ambulation Assistive Devices: WAdministratorAmbulation/Gait Assistance: 1: +2 Total assist Locomotion: Ambulation: 0: Activity did not  occur  Comprehension Comprehension Mode: Auditory Comprehension: 3-Understands basic 50 - 74% of the time/requires cueing 25 - 50%  of the time  Expression Expression Mode: Verbal Expression: 3-Expresses basic 50 - 74% of the time/requires cueing 25 - 50% of the time. Needs to repeat parts of sentences.  Social Interaction Social Interaction: 3-Interacts appropriately 50 - 74% of the time - May be physically or verbally inappropriate.  Problem Solving Problem Solving: 2-Solves basic 25 - 49% of the time - needs direction more than half the time to initiate, plan or complete simple activities  Memory Memory: 2-Recognizes or recalls 25 - 49% of the time/requires cueing 51 - 75% of the time  Medical Problem List and Plan:  1. 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 DVT Prophylaxis/Anticoagulation: SCDs. Monitor for any signs of DVT  3. 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 Neuropsych:  This patient is not capable of making decisions on his own behalf.  5. Seizure prophylaxis. Keppra 500 mg twice a day. Monitor for any seizure activity  6. Hypertension. Norvasc 10 mg daily,lisinopril 2.5, propranolol 58m TID will increase to 231m hydrochlorothiazide 25 mg daily will D/C secondary to poor hydration status. Monitor with increased mobility  7. Dysphagia.. Marland Kitchenysphagia 3 thin liquids. Followup speech therapy , trial megace for appetite also on Remeron 8. History of tobacco alcohol abuse. Urine drug screen positive cocaine and marijuana. Provide counseling as appropriate  9.  Rectal pain, limited exam (secondary to agitation) unremarkable Hgb and WBC normal, no overt rectal bleeding, check stool guaic, check KUB  LOS (Days) 11 A FACE TO FACE EVALUATION WAS PERFORMED  KIRSTEINS,ANDREW E 03/13/2013,  8:16 AM

## 2013-03-13 NOTE — Plan of Care (Signed)
Problem: RH BLADDER ELIMINATION Goal: RH STG MANAGE BLADDER WITH ASSISTANCE STG Manage Bladder With supervision  Outcome: Not Progressing Condom cath in place at all times for now.

## 2013-03-13 NOTE — Progress Notes (Signed)
Patient complains of pain to rectal area, and stool smears noted. Cleaned patient x 2 this shift of smears and wrenches and/or yells in pain when wiping rectal area with washcloth gently. Patient refuses any PO medications at this time. MD notified on-call, orders given for Anusol PR Q day PRN. Patient was compliant with the suppository given at 0027. Patient constantly uses profanity and is upset that he is using the bathroom on hisself all the time and its painful. Patient fell asleep shortly after that until 0300. Patient called out for pain to rectal area again. Reports that the pain did decrease a little since the Anusol was given. Barrier cream ointment was applied and repositioned patient to left side in attempts to decrease pain. Patient appears very upset and continues to repeat himself and uses profanity heavily and raises his voice at times. Encouraged patient to try and rest a while and to let the ointment and repositioning assist him to gain some relief at this point. Cont plan of care.

## 2013-03-14 ENCOUNTER — Inpatient Hospital Stay (HOSPITAL_COMMUNITY): Payer: Medicaid Other | Admitting: Physical Therapy

## 2013-03-14 ENCOUNTER — Inpatient Hospital Stay (HOSPITAL_COMMUNITY): Payer: Medicaid Other | Admitting: Speech Pathology

## 2013-03-14 ENCOUNTER — Encounter (HOSPITAL_COMMUNITY): Payer: Self-pay

## 2013-03-14 ENCOUNTER — Inpatient Hospital Stay (HOSPITAL_COMMUNITY): Payer: Medicaid Other | Admitting: Occupational Therapy

## 2013-03-14 DIAGNOSIS — I69998 Other sequelae following unspecified cerebrovascular disease: Secondary | ICD-10-CM

## 2013-03-14 DIAGNOSIS — G8110 Spastic hemiplegia affecting unspecified side: Secondary | ICD-10-CM

## 2013-03-14 DIAGNOSIS — I619 Nontraumatic intracerebral hemorrhage, unspecified: Secondary | ICD-10-CM

## 2013-03-14 DIAGNOSIS — R209 Unspecified disturbances of skin sensation: Secondary | ICD-10-CM

## 2013-03-14 LAB — CBC
HEMATOCRIT: 41.8 % (ref 39.0–52.0)
HEMOGLOBIN: 14.7 g/dL (ref 13.0–17.0)
MCH: 30.6 pg (ref 26.0–34.0)
MCHC: 35.2 g/dL (ref 30.0–36.0)
MCV: 86.9 fL (ref 78.0–100.0)
Platelets: 287 10*3/uL (ref 150–400)
RBC: 4.81 MIL/uL (ref 4.22–5.81)
RDW: 12.7 % (ref 11.5–15.5)
WBC: 7.5 10*3/uL (ref 4.0–10.5)

## 2013-03-14 NOTE — Progress Notes (Signed)
SLP Cancellation Note  Patient Details Name: Eric May MRN: [id number] DOB: 1960/06/17   Cancelled treatment: Pt missed 30 minutes of skilled co-treatment with OT due to refusal to participate. RN made aware of missed therapy time.   Maxcine HamLaura Paiewonsky, M.A. CCC-SLP [phone]    Maxcine Hamaiewonsky, Kyrie Bun 03/14/2013, 3:12 PM

## 2013-03-14 NOTE — Progress Notes (Signed)
Speech Language Pathology Daily Session Note  Patient Details  Name: Eric May MRN: [id number] Date of Birth: Jun 13, 1960  Today's Date: 03/14/2013 Time: 1530-1600 Time Calculation (min): 30 min  Short Term Goals: Week 2: SLP Short Term Goal 1 (Week 2): Patient will sustain attention to basic self-care task for 5-8 minutes with Max clinician cues. SLP Short Term Goal 2 (Week 2): Patient will solve basic problems with Max verbal cues. SLP Short Term Goal 3 (Week 2): Patient will label 2 physical and 1 cognitive deficit with Max questions cues. SLP Short Term Goal 4 (Week 2): Patient will consume regular textures and thin liquids with Mod cues for safety to prevent overt s/s of aspiration.  SLP Short Term Goal 5 (Week 2): Patient will maintain topic of conversation for 3 turns with Max assist clinician cues  Skilled Therapeutic Interventions: Skilled treatment session focused on addressing cognition goals.  SLP facilitated session with 2+ assist to transfer to bedside commode for attempt at a bowel movement.  SLP also facilitated session with set-up of Dys.3 textures and thin liquids via straw; he required Mod verbal and tactile cues to safely self-feed.     FIM:  Comprehension Comprehension Mode: Auditory Comprehension: 3-Understands basic 50 - 74% of the time/requires cueing 25 - 50%  of the time Expression Expression Mode: Verbal Expression: 3-Expresses basic 50 - 74% of the time/requires cueing 25 - 50% of the time. Needs to repeat parts of sentences. Social Interaction Social Interaction: 3-Interacts appropriately 50 - 74% of the time - May be physically or verbally inappropriate. Problem Solving Problem Solving: 2-Solves basic 25 - 49% of the time - needs direction more than half the time to initiate, plan or complete simple activities Memory Memory: 2-Recognizes or recalls 25 - 49% of the time/requires cueing 51 - 75% of the time FIM - Eating Eating Activity: 1: Helper  performs IV, parenteral, or tube feeding  Pain Pain Assessment Pain Assessment: 0-10 Pain Score: 5  Pain Type: Acute pain Pain Location: Abdomen Pain Onset: Gradual Pain Intervention(s): RN made aware Multiple Pain Sites: No  Therapy/Group: Individual Therapy  Charlane FerrettiMelissa Gerado Nabers, M.A., CCC-SLP [phone]  Eric May 03/14/2013, 5:23 PM

## 2013-03-14 NOTE — Progress Notes (Signed)
Physical Therapy Session Note  Patient Details  Name: Eric May MRN: [id number] Date of Birth: August 16, 1960  Today's Date: 03/14/2013 Time: [id number] Time Calculation (min): 51 min  Skilled Therapeutic Interventions/Progress Updates:   Pt resting in bed; pt reporting need to use bathroom when awakened.  When attempting to sit up pt reporting he is going to the bathroom in the bed.  Pt finally able to transfer to EOB with min-mod A to advance LLE off bed but pt able to sit up with min A.  Performed transfer bed > BSC stand pivot min A; pt maintained standing with min A while performing brief doffing, hygiene and donning of clean brief.  Pt with incontinent urine but no BM.  Transferred to w/c min A and performed oral hygiene and face washing at sink with cues to change tasks secondary to perseveration.  Pt agreeable to gait training.  Performed gait training in hallway with R rail x 25' x 2 reps with max A with LUE around therapist with assistance for full LLE advancement, safe placement and stabilization in LLE during stance for full RLE advancement.  Pt reporting less pain today and more agreeable and willing to work with therapy stating that "I'm going to do this therapy so I can get out of the hospital."    Therapy Documentation Precautions:  Precautions Precautions: Fall Restrictions Weight Bearing Restrictions: No Vital Signs: Therapy Vitals Temp: 98.3 F (36.8 C) Temp src: Oral Pulse Rate: 103 Resp: 18 BP: 120/84 mmHg Patient Position, if appropriate: Sitting Oxygen Therapy SpO2: 99 % O2 Device: None (Room air) Pain:  No reports of pain except intermittent reports of gas discomfort Locomotion : Ambulation Ambulation/Gait Assistance: 2: Max assist   See FIM for current functional status  Therapy/Group: Individual Therapy  Edman CircleHall, Salsabeel Gorelick [facility] 03/14/2013, 3:39 PM

## 2013-03-14 NOTE — Progress Notes (Signed)
53 y.o. right-handed male with history of hypertension, tobacco and alcohol abuse. Admitted 02/23/2013 when patient developed left-sided numbness and weakness while at work. Patient reports falling at that time due to weakness on the left. Blood pressure 194/117. CT of the head showed acute parenchymal hemorrhage over the right temporal parietal region measuring 4.9 x 3.2 cm with local mass effect and midline shift of 4 mm. Echocardiogram with ejection fraction 65% no valvular abnormalities. Carotid Dopplers with no ICA stenosis. MRI of the brain 02/24/2013 again showed intraparenchymal hematoma 5.2 x 3.7 x 3.3 cm with surrounding vasogenic edema. Neurosurgery Dr. Kathyrn Sheriff consult advise conservative management with repeat serial cranial CT scans. Urine drug screen was positive for cocaine as well as marijuana. Maintained on Cardene drip for blood pressure control.  Diet advance to dysphagia 3 thin liquids 03/01/2013. Keppra for seizure prophylaxis  Subjective/Complaints: "I am better", "I apologize for acting" Multiple BMs last noc Received xanax at 3 am Review of Systems - unable to obtain secondary to reduced level of alertness Objective: Vital Signs: Blood pressure 126/90, pulse 70, temperature 98.3 F (36.8 C), temperature source Oral, resp. rate 18, height 6' (1.829 m), weight 60.1 kg (132 lb 7.9 oz), SpO2 94.00%. Dg Abd Portable 1v  03/13/2013   CLINICAL DATA:  Constipation with nausea and vomiting for 2 days  EXAM: PORTABLE ABDOMEN - 1 VIEW  COMPARISON:  None.  FINDINGS: There is a moderate amount of gas within loops of small bowel as well as throughout much of the colon. No massive distention is seen. There is a large amount of stool in the rectum in the appearance of this suggests a fecal impaction. No abnormal soft tissue calcifications are demonstrated. Gentle curvature of the lumbar spine with convexity towards the left may be positional.  IMPRESSION: The bowel gas pattern suggesta fecal  impaction with proximal distention of small and large bowel loops.   Electronically Signed   By: David  Martinique   On: 03/13/2013 16:26   Results for orders placed during the hospital encounter of 03/02/13 (from the past 72 hour(s))  GLUCOSE, CAPILLARY     Status: None   Collection Time    03/12/13  3:34 PM      Result Value Range   Glucose-Capillary 99  70 - 99 mg/dL   Comment 1 Notify RN    CBC     Status: None   Collection Time    03/12/13  5:09 PM      Result Value Range   WBC 9.2  4.0 - 10.5 K/uL   RBC 5.50  4.22 - 5.81 MIL/uL   Hemoglobin 16.8  13.0 - 17.0 g/dL   HCT 48.1  39.0 - 52.0 %   MCV 87.5  78.0 - 100.0 fL   MCH 30.5  26.0 - 34.0 pg   MCHC 34.9  30.0 - 36.0 g/dL   RDW 12.9  11.5 - 15.5 %   Platelets 328  150 - 400 K/uL  BASIC METABOLIC PANEL     Status: Abnormal   Collection Time    03/12/13  5:09 PM      Result Value Range   Sodium 130 (*) 137 - 147 mEq/L   Potassium 4.8  3.7 - 5.3 mEq/L   Chloride 90 (*) 96 - 112 mEq/L   CO2 24  19 - 32 mEq/L   Glucose, Bld 102 (*) 70 - 99 mg/dL   BUN 33 (*) 6 - 23 mg/dL   Creatinine, Ser 1.34  0.50 - 1.35 mg/dL   Calcium 10.3  8.4 - 10.5 mg/dL   GFR calc non Af Amer 60 (*) >90 mL/min   GFR calc Af Amer 70 (*) >90 mL/min   Comment: (NOTE)     The eGFR has been calculated using the CKD EPI equation.     This calculation has not been validated in all clinical situations.     eGFR's persistently <90 mL/min signify possible Chronic Kidney     Disease.  CBC     Status: None   Collection Time    03/14/13  5:45 AM      Result Value Range   WBC 7.5  4.0 - 10.5 K/uL   RBC 4.81  4.22 - 5.81 MIL/uL   Hemoglobin 14.7  13.0 - 17.0 g/dL   HCT 41.8  39.0 - 52.0 %   MCV 86.9  78.0 - 100.0 fL   MCH 30.6  26.0 - 34.0 pg   MCHC 35.2  30.0 - 36.0 g/dL   RDW 12.7  11.5 - 15.5 %   Platelets 287  150 - 400 K/uL      Constitutional: He appears alert, no distress. Resting in bed quietly until I entered room HENT: poor dentition,  Head:  Normocephalic. atraumatic Eyes:  Pupils EOMI, without nystagmus  Neck: Normal range of motion. Neck supple. No thyromegaly present.  Cardiovascular: Normal rate and regular rhythm. No murmurs  Respiratory: Effort normal and breath sounds normal. No respiratory distress. No wheezes, rales, rhonchi  GI: Soft. Bowel sounds are normal. He exhibits no distension.  Neurological:  . Oriented to name and hosp. He did follow some simple commands.Loses attention quickly . 1/5 left shoulder ,grip was 2-/5. LLE was 2/5 with HF and KE and trace at the ankle.  Speech moderately dysarthric.  RUE 4/5 RLE 5/5 Skin: Skin is warm and dry.  Psychiatric:  Flat, lethargic MSK:  - SLR  Rectal- no external hemorrhoids, good sphincter tone, didn't tolerate rectal exam beyond anal verge, no masses palpated  Assessment/Plan: 1. 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 Physiatrist is providing close team supervision and 24 hour management of active medical problems listed below. Physiatrist and rehab team continue to assess barriers to discharge/monitor patient progress toward functional and medical goals.  FIM: FIM - Bathing Bathing Steps Patient Completed: Chest;Left Arm;Abdomen;Front perineal area;Right upper leg;Left upper leg Bathing: 3: Mod-Patient completes 5-7 73f10 parts or 50-74%  FIM - Upper Body Dressing/Undressing Upper body dressing/undressing steps patient completed: Thread/unthread right sleeve of pullover shirt/dresss;Put head through opening of pull over shirt/dress;Thread/unthread right sleeve of front closure shirt/dress;Pull shirt over trunk Upper body dressing/undressing: 3: Mod-Patient completed 50-74% of tasks FIM - Lower Body Dressing/Undressing Lower body dressing/undressing steps patient completed:  (wears brief) Lower  body dressing/undressing: 1: Total-Patient completed less than 25% of tasks  FIM - Toileting Toileting steps completed by patient: Performs perineal hygiene Toileting Assistive Devices: Grab bar or rail for support Toileting: 2: Max-Patient completed 1 of 3 steps  FIM - TRadio producerDevices: Grab bars Toilet Transfers: 3-To toilet/BSC: Mod A (lift or lower assist);3-From toilet/BSC: Mod A (lift or lower assist)  FIM - Bed/Chair Transfer Bed/Chair Transfer Assistive Devices: Arm rests Bed/Chair Transfer: 4: Supine > Sit: Min A (steadying Pt. > 75%/lift 1 leg);3: Sit > Supine: Mod A (lifting assist/Pt. 50-74%/lift 2  legs);3: Bed > Chair or W/C: Mod A (lift or lower assist);3: Chair or W/C > Bed: Mod A (lift or lower assist)  FIM - Locomotion: Wheelchair Distance: 50 Locomotion: Wheelchair: 1: Total Assistance/staff pushes wheelchair (Pt<25%) FIM - Locomotion: Ambulation Locomotion: Ambulation Assistive Devices: Administrator Ambulation/Gait Assistance: 1: +2 Total assist Locomotion: Ambulation: 0: Activity did not occur  Comprehension Comprehension Mode: Auditory Comprehension: 3-Understands basic 50 - 74% of the time/requires cueing 25 - 50%  of the time  Expression Expression Mode: Verbal Expression: 3-Expresses basic 50 - 74% of the time/requires cueing 25 - 50% of the time. Needs to repeat parts of sentences.  Social Interaction Social Interaction: 2-Interacts appropriately 25 - 49% of time - Needs frequent redirection.  Problem Solving Problem Solving: 2-Solves basic 25 - 49% of the time - needs direction more than half the time to initiate, plan or complete simple activities  Memory Memory: 2-Recognizes or recalls 25 - 49% of the time/requires cueing 51 - 75% of the time  Medical Problem List and Plan:  1. 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 DVT Prophylaxis/Anticoagulation: SCDs. Monitor for any signs of DVT  3. 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 Neuropsych: This patient is not capable of making decisions on his own behalf.  5. Seizure prophylaxis. Keppra 500 mg twice a day. Monitor for any seizure activity  6. Hypertension. Norvasc 10 mg daily,lisinopril 2.5, propranolol 82m TID will increase to 268m hydrochlorothiazide 25 mg daily will D/C secondary to poor hydration status. Monitor with increased mobility  7. Dysphagia.. Marland Kitchenysphagia 3 thin liquids. Followup speech therapy , trial megace for appetite also on Remeron 8. History of tobacco alcohol abuse. Urine drug screen positive cocaine and marijuana. Provide counseling as appropriate  9.  Rectal pain,KUB full of stool, sm rectal clot passed but no subsequent bleeding, suspect stercoral ulcer from fecal impaction  LOS (Days) 12 A FACE TO FACE EVALUATION WAS PERFORMED  [place] Segler E 03/14/2013, 6:55 AM

## 2013-03-14 NOTE — Progress Notes (Signed)
Occupational Therapy Session Note  Patient Details  Name: Eric May MRN: [id number] Date of Birth: Sep 12, 1960  Today's Date: 03/14/2013 Time: 1035-1100 Time Calculation (min): 25 min  Short Term Goals: Week 2:  OT Short Term Goal 1 (Week 2): Pt will complete UB dressing with min assist OT Short Term Goal 2 (Week 2): Pt will complete LB dressing with max assist OT Short Term Goal 3 (Week 2): Pt will complete bathing with min assist and min verbal cues for initiation and sequencing. OT Short Term Goal 4 (Week 2): Pt will complete toilet transfer with mod assist 3 consecutive sessions. OT Short Term Goal 5 (Week 2): Pt will complete shower transfer with mod assist.  Skilled Therapeutic Interventions/Progress Updates:    Pt seen for ADL retraining with focus on increased participation in self-care tasks.  Pt missed first 5 mins secondary to IV RN placing IV.  Upon return pt reports having incontinent BM.  Pt participated in rolling Rt and Lt to allow therapist to perform hygiene.  Pt with multiple c/o pain in buttocks and tenderness when wiping with wet wash cloth.  RN present to apply barrier cream to buttocks.  Pt calm throughout session and participatory with rolling and even anticipating commands.  Pt with no clean clothes at this time (placed in laundry) and once clean was unwilling to get OOB to participate further.  RN aware.  Therapy Documentation Precautions:  Precautions Precautions: Fall Restrictions Weight Bearing Restrictions: No General: General Amount of Missed OT Time (min): 20 Minutes Pain: Pain Assessment Pain Assessment: No/denies pain Pain Score: 0-No pain  See FIM for current functional status  Therapy/Group: Individual Therapy  [name], Sonny Anthes 03/14/2013, 12:20 PM

## 2013-03-15 ENCOUNTER — Inpatient Hospital Stay (HOSPITAL_COMMUNITY): Payer: Medicaid Other | Admitting: Speech Pathology

## 2013-03-15 ENCOUNTER — Inpatient Hospital Stay (HOSPITAL_COMMUNITY): Payer: Medicaid Other

## 2013-03-15 ENCOUNTER — Inpatient Hospital Stay (HOSPITAL_COMMUNITY): Payer: Medicaid Other | Admitting: Occupational Therapy

## 2013-03-15 MED ORDER — HYDROCORTISONE ACETATE 25 MG RE SUPP
25.0000 mg | Freq: Two times a day (BID) | RECTAL | Status: AC
  Administered 2013-03-15 – 2013-03-16 (×4): 25 mg via RECTAL
  Filled 2013-03-15 (×7): qty 1

## 2013-03-15 NOTE — Progress Notes (Signed)
Physical Therapy Session Note  Patient Details  Name: Eric May MRN: [id number] Date of Birth: 1960-05-03  Today's Date: 03/15/2013 Time: 1430-1520 Time Calculation (min): 50 min  Skilled Therapeutic Interventions/Progress Updates:  Patient in bed upon entering room. Co-treatment with recreation therapy. Patient reported having to use bathroom. Patient perseverating on pain in stomach and constantly having to use the bathroom. Patient incontinent in brief prior to getting to toilet. Patient transferred with mod assist to and from toilet. Patient assisted with hygiene. Therapist assisted with applying barrier cream. Patient required assist with clothing prior and after toileting task. Patient ambulated with rolling walker and left AFO 25 feet with +2 assist to assist with IV pole. Patient required facilitation of swing on left and assist with knee control on left during stance. Patient also required occasional assist to keep left hand on walker. Patient sit to stand with railing in hallway to work on weight bearing/stance on left while kicking ball with right. Patient transferred wheelchair to bed with min assist. Patient left in bed with 3 side rails up, alarm set, and all items in reach.  Therapy Documentation Precautions:  Precautions Precautions: Fall Restrictions Weight Bearing Restrictions: No Pain: Patient complained of abdominal discomfort - unable to give number.  Locomotion : Ambulation Ambulation/Gait Assistance: 1: +2 Total assist   See FIM for current functional status  Therapy/Group: Individual Therapy  [name], [name] 03/15/2013, 4:18 PM

## 2013-03-15 NOTE — Progress Notes (Signed)
53 y.o. right-handed male with history of hypertension, tobacco and alcohol abuse. Admitted 02/23/2013 when patient developed left-sided numbness and weakness while at work. Patient reports falling at that time due to weakness on the left. Blood pressure 194/117. CT of the head showed acute parenchymal hemorrhage over the right temporal parietal region measuring 4.9 x 3.2 cm with local mass effect and midline shift of 4 mm. Echocardiogram with ejection fraction 65% no valvular abnormalities. Carotid Dopplers with no ICA stenosis. MRI of the brain 02/24/2013 again showed intraparenchymal hematoma 5.2 x 3.7 x 3.3 cm with surrounding vasogenic edema. Neurosurgery Dr. Kathyrn Sheriff consult advise conservative management with repeat serial cranial CT scans. Urine drug screen was positive for cocaine as well as marijuana. Maintained on Cardene drip for blood pressure control.  Diet advance to dysphagia 3 thin liquids 03/01/2013. Keppra for seizure prophylaxis  Subjective/Complaints: Still with intermittent rectal pain, no further bleeding reported by RN, remains confused with poor participation in therapy Received xanax at 3 am Review of Systems - unable to obtain secondary to reduced level of alertness Objective: Vital Signs: Blood pressure 115/81, pulse 68, temperature 97.9 F (36.6 C), temperature source Oral, resp. rate 18, height 6' (1.829 m), weight 60.1 kg (132 lb 7.9 oz), SpO2 99.00%. Dg Abd Portable 1v  03/13/2013   CLINICAL DATA:  Constipation with nausea and vomiting for 2 days  EXAM: PORTABLE ABDOMEN - 1 VIEW  COMPARISON:  None.  FINDINGS: There is a moderate amount of gas within loops of small bowel as well as throughout much of the colon. No massive distention is seen. There is a large amount of stool in the rectum in the appearance of this suggests a fecal impaction. No abnormal soft tissue calcifications are demonstrated. Gentle curvature of the lumbar spine with convexity towards the left may be  positional.  IMPRESSION: The bowel gas pattern suggesta fecal impaction with proximal distention of small and large bowel loops.   Electronically Signed   By: David  Martinique   On: 03/13/2013 16:26   Results for orders placed during the hospital encounter of 03/02/13 (from the past 72 hour(s))  GLUCOSE, CAPILLARY     Status: None   Collection Time    03/12/13  3:34 PM      Result Value Range   Glucose-Capillary 99  70 - 99 mg/dL   Comment 1 Notify RN    CBC     Status: None   Collection Time    03/12/13  5:09 PM      Result Value Range   WBC 9.2  4.0 - 10.5 K/uL   RBC 5.50  4.22 - 5.81 MIL/uL   Hemoglobin 16.8  13.0 - 17.0 g/dL   HCT 48.1  39.0 - 52.0 %   MCV 87.5  78.0 - 100.0 fL   MCH 30.5  26.0 - 34.0 pg   MCHC 34.9  30.0 - 36.0 g/dL   RDW 12.9  11.5 - 15.5 %   Platelets 328  150 - 400 K/uL  BASIC METABOLIC PANEL     Status: Abnormal   Collection Time    03/12/13  5:09 PM      Result Value Range   Sodium 130 (*) 137 - 147 mEq/L   Potassium 4.8  3.7 - 5.3 mEq/L   Chloride 90 (*) 96 - 112 mEq/L   CO2 24  19 - 32 mEq/L   Glucose, Bld 102 (*) 70 - 99 mg/dL   BUN 33 (*) 6 - 23  mg/dL   Creatinine, Ser 1.34  0.50 - 1.35 mg/dL   Calcium 10.3  8.4 - 10.5 mg/dL   GFR calc non Af Amer 60 (*) >90 mL/min   GFR calc Af Amer 70 (*) >90 mL/min   Comment: (NOTE)     The eGFR has been calculated using the CKD EPI equation.     This calculation has not been validated in all clinical situations.     eGFR's persistently <90 mL/min signify possible Chronic Kidney     Disease.  CBC     Status: None   Collection Time    03/14/13  5:45 AM      Result Value Range   WBC 7.5  4.0 - 10.5 K/uL   RBC 4.81  4.22 - 5.81 MIL/uL   Hemoglobin 14.7  13.0 - 17.0 g/dL   HCT 41.8  39.0 - 52.0 %   MCV 86.9  78.0 - 100.0 fL   MCH 30.6  26.0 - 34.0 pg   MCHC 35.2  30.0 - 36.0 g/dL   RDW 12.7  11.5 - 15.5 %   Platelets 287  150 - 400 K/uL      Constitutional: He appears alert, no distress. Resting in  bed quietly until I entered room HENT: poor dentition,  Head: Normocephalic. atraumatic Eyes:  Pupils EOMI, without nystagmus  Neck: Normal range of motion. Neck supple. No thyromegaly present.  Cardiovascular: Normal rate and regular rhythm. No murmurs  Respiratory: Effort normal and breath sounds normal. No respiratory distress. No wheezes, rales, rhonchi  GI: Soft. Bowel sounds are normal. He exhibits no distension.  Neurological:  . Oriented to name and hosp. He did follow some simple commands.Loses attention quickly . 1/5 left shoulder ,grip was 2-/5. LLE was 2/5 with HF and KE and trace at the ankle.  Speech moderately dysarthric.  RUE 4/5 RLE 5/5 Skin: Skin is warm and dry.  Psychiatric:  Flat, lethargic MSK:  - SLR  Rectal- no external hemorrhoids, good sphincter tone, didn't tolerate rectal exam beyond anal verge, no masses palpated  Assessment/Plan: 1. 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 Physiatrist is providing close team supervision and 24 hour management of active medical problems listed below. Physiatrist and rehab team continue to assess barriers to discharge/monitor patient progress toward functional and medical goals.  FIM: FIM - Bathing Bathing Steps Patient Completed: Chest;Left Arm;Abdomen;Front perineal area;Right upper leg;Left upper leg Bathing: 3: Mod-Patient completes 5-7 [id number] parts or 50-74%  FIM - Upper Body Dressing/Undressing Upper body dressing/undressing steps patient completed: Thread/unthread right sleeve of pullover shirt/dresss;Put head through opening of pull over shirt/dress;Thread/unthread right sleeve of front closure shirt/dress;Pull shirt over trunk Upper body dressing/undressing: 3: Mod-Patient completed 50-74% of tasks FIM - Lower Body Dressing/Undressing Lower body  dressing/undressing steps patient completed:  (wears brief) Lower body dressing/undressing: 1: Total-Patient completed less than 25% of tasks  FIM - Toileting Toileting steps completed by patient: Performs perineal hygiene Toileting Assistive Devices: Grab bar or rail for support Toileting: 1: Two helpers  FIM - TRadio producerDevices: BRecruitment consultantTransfers: 4-To toilet/BSC: Min A (steadying Pt. > 75%);4-From toilet/BSC: Min A (steadying Pt. > 75%)  FIM - Bed/Chair Transfer Bed/Chair Transfer Assistive Devices: Arm rests Bed/Chair Transfer: 3: Supine > Sit: Mod A (lifting assist/Pt. 50-74%/lift 2 legs;4: Sit > Supine: Min A (steadying pt. >  75%/lift 1 leg);4: Bed > Chair or W/C: Min A (steadying Pt. > 75%);4: Chair or W/C > Bed: Min A (steadying Pt. > 75%)  FIM - Locomotion: Wheelchair Distance: 50 Locomotion: Wheelchair: 1: Total Assistance/staff pushes wheelchair (Pt<25%) FIM - Locomotion: Ambulation Locomotion: Ambulation Assistive Devices: Other (comment) (wall rail) Ambulation/Gait Assistance: 2: Max assist Locomotion: Ambulation: 1: Travels less than 50 ft with maximal assistance (Pt: 25 - 49%)  Comprehension Comprehension Mode: Auditory Comprehension: 3-Understands basic 50 - 74% of the time/requires cueing 25 - 50%  of the time  Expression Expression Mode: Verbal Expression: 3-Expresses basic 50 - 74% of the time/requires cueing 25 - 50% of the time. Needs to repeat parts of sentences.  Social Interaction Social Interaction: 3-Interacts appropriately 50 - 74% of the time - May be physically or verbally inappropriate.  Problem Solving Problem Solving: 2-Solves basic 25 - 49% of the time - needs direction more than half the time to initiate, plan or complete simple activities  Memory Memory: 2-Recognizes or recalls 25 - 49% of the time/requires cueing 51 - 75% of the time  Medical Problem List and Plan:  1. 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 DVT Prophylaxis/Anticoagulation: SCDs. Monitor for any signs of DVT  3. 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 Neuropsych: This patient is not capable of making decisions on his own behalf.  5. Seizure prophylaxis. Keppra 500 mg twice a day. Monitor for any seizure activity  6. Hypertension. Norvasc 10 mg daily,lisinopril 2.5, propranolol 34m TID will increase to 23m hydrochlorothiazide 25 mg daily will D/C secondary to poor hydration status. Monitor with increased mobility  7. Dysphagia.. Marland Kitchenysphagia 3 thin liquids. Followup speech therapy , trial megace for appetite also on Remeron 8. History of tobacco alcohol abuse. Urine drug screen positive cocaine and marijuana. Provide counseling as appropriate  9.  Rectal pain,KUB full of stool, sm rectal clot passed but no subsequent bleeding, suspect stercoral ulcer or anal fissure from fecal impaction  LOS (Days) 13 A FACE TO FACE EVALUATION WAS PERFORMED  Benny Deutschman E 03/15/2013, 6:37 AM

## 2013-03-15 NOTE — Progress Notes (Signed)
Occupational Therapy Session Note  Patient Details  Name: Eric May MRN: [id number] Date of Birth: 01/15/1961  Today's Date: 03/15/2013 Time: 1030-1105 Time Calculation (min): 35 min  Short Term Goals: Week 2:  OT Short Term Goal 1 (Week 2): Pt will complete UB dressing with min assist OT Short Term Goal 2 (Week 2): Pt will complete LB dressing with max assist OT Short Term Goal 3 (Week 2): Pt will complete bathing with min assist and min verbal cues for initiation and sequencing. OT Short Term Goal 4 (Week 2): Pt will complete toilet transfer with mod assist 3 consecutive sessions. OT Short Term Goal 5 (Week 2): Pt will complete shower transfer with mod assist.  Skilled Therapeutic Interventions/Progress Updates:    Pt seen for ADL retraining with focus on bed mobility, toilet transfer, and standing balance.  Pt in bed upon arrival, reporting discomfort in abdomen and requesting to toilet.  Squat pivot transfer to drop arm BSC where pt attempted to have BM with on success.  Engaged in sit > stand x2 with pt assisting with pulling up brief and pants on Rt side while therapist blocked Lt knee and pulled up pants on Lt.  Once pt completed toileting, reports fatigue and wanting to rest.  Pt requested to sit in recliner, performed bed mobility to roll to opposite side of bed and then perform squat pivot transfer.  Pt with decreased motor control of LLE and decreased proprioception requiring assist with positioning LLE.  Pt left in recliner with legs elevated and quick release belt with call bell in reach.  RN notified of pt's position.  Therapy Documentation Precautions:  Precautions Precautions: Fall Restrictions Weight Bearing Restrictions: No General: General Amount of Missed OT Time (min): 10 Minutes Vital Signs:   Pain: Pain Assessment Pain Assessment: 0-10 Pain Type: Acute pain Pain Location: Abdomen Pain Onset: Gradual Pain Intervention(s): Medication (See eMAR)  See FIM  for current functional status  Therapy/Group: Individual Therapy  [name], Cassundra Mckeever 03/15/2013, 12:09 PM

## 2013-03-15 NOTE — Progress Notes (Signed)
Recreational Therapy Session Note  Patient Details  Name: Gerre Pebblesnthony Knecht MRN: [id number] Date of Birth: Feb 24, 1961 Today's Date: 03/15/2013  Pain: c/o pain in relation to bowels, RN aware Skilled Therapeutic Interventions/Progress Updates: session focused on toiletting, transfers, ambulation, dynamic standing balance, & safety.  Pt stated need to use restroom prior to participating in therapy.  Pt performed toilet transfer with mod assist and toiletting with max assist.  Pt required mod verbal cues for cognition, safety, encouragement.  Pt ambulated with +2 assist using RW ~15'.  Pt stood to kick a ball with RLE and with 1UE support on the rail with mod assist.  Therapy/Group: Co-Treatment   Zamyah Wiesman 03/15/2013, 4:21 PM 5

## 2013-03-15 NOTE — Progress Notes (Signed)
Speech Language Pathology Daily Session Note  Patient Details  Name: Eric May MRN: [id number] Date of Birth: 1960/05/11  Today's Date: 03/15/2013 Time: 1302-1330 Time Calculation (min): 28 min  Short Term Goals: Week 2: SLP Short Term Goal 1 (Week 2): Patient will sustain attention to basic self-care task for 5-8 minutes with Max clinician cues. SLP Short Term Goal 2 (Week 2): Patient will solve basic problems with Max verbal cues. SLP Short Term Goal 3 (Week 2): Patient will label 2 physical and 1 cognitive deficit with Max questions cues. SLP Short Term Goal 4 (Week 2): Patient will consume regular textures and thin liquids with Mod cues for safety to prevent overt s/s of aspiration.  SLP Short Term Goal 5 (Week 2): Patient will maintain topic of conversation for 3 turns with Max assist clinician cues  Skilled Therapeutic Interventions: Skilled treatment session focused on addressing cognition goals.  SLP facilitated session with 2+ assist to transfer to bedside commode for a bowel movement.  While, he perseverated on need throughout session he was able end task after successful attempt and was left in bed resting.      FIM:  Comprehension Comprehension Mode: Auditory Comprehension: 3-Understands basic 50 - 74% of the time/requires cueing 25 - 50%  of the time Expression Expression Mode: Verbal Expression: 3-Expresses basic 50 - 74% of the time/requires cueing 25 - 50% of the time. Needs to repeat parts of sentences. Social Interaction Social Interaction: 3-Interacts appropriately 50 - 74% of the time - May be physically or verbally inappropriate. Problem Solving Problem Solving: 2-Solves basic 25 - 49% of the time - needs direction more than half the time to initiate, plan or complete simple activities Memory Memory: 2-Recognizes or recalls 25 - 49% of the time/requires cueing 51 - 75% of the time FIM - Eating Eating Activity: 1: Helper performs IV, parenteral, or tube  feeding;5: Needs verbal cues/supervision  Pain  on going pain related to bowel issues; RN present for session and aware.   Therapy/Group: Individual Therapy  Charlane FerrettiMelissa Lorne Winkels, M.A., CCC-SLP [phone]  [name] 03/15/2013, 4:07 PM

## 2013-03-15 NOTE — Progress Notes (Signed)
Anusol Supp given at 1158. Patient very tender to rectal area. BM's begin aprox 45 min afterward. Patient has had BM x3 on the BSC, moderate amount each time with scant amount of blood noted due to hemorrhoids. Patient reports relief and no pain on rounds at 1545. Continue plan of care. Brittan Mapel, Phill MutterMelissa Rebecca

## 2013-03-16 ENCOUNTER — Inpatient Hospital Stay (HOSPITAL_COMMUNITY): Payer: Medicaid Other | Admitting: Occupational Therapy

## 2013-03-16 ENCOUNTER — Inpatient Hospital Stay (HOSPITAL_COMMUNITY): Payer: Medicaid Other | Admitting: Physical Therapy

## 2013-03-16 ENCOUNTER — Inpatient Hospital Stay (HOSPITAL_COMMUNITY): Payer: Medicaid Other | Admitting: Speech Pathology

## 2013-03-16 NOTE — Progress Notes (Signed)
Occupational Therapy Session Note  Patient Details  Name: Eric May MRN: [id number] Date of Birth: 1960/08/27  Today's Date: 03/16/2013 Time: [id number] Time Calculation (min): 45 min  Short Term Goals: Week 2:  OT Short Term Goal 1 (Week 2): Pt will complete UB dressing with min assist OT Short Term Goal 2 (Week 2): Pt will complete LB dressing with max assist OT Short Term Goal 3 (Week 2): Pt will complete bathing with min assist and min verbal cues for initiation and sequencing. OT Short Term Goal 4 (Week 2): Pt will complete toilet transfer with mod assist 3 consecutive sessions. OT Short Term Goal 5 (Week 2): Pt will complete shower transfer with mod assist.  Skilled Therapeutic Interventions/Progress Updates:    Pt seen for ADL retraining with focus on transfers, sit > stand, and increased participation in self-care tasks.  Pt in bed upon arrival finishing breakfast.  Pt willing to participate in bathing at shower level this session.  Performed squat pivot to w/c min assist and then stand pivot to shower seat in walk-in shower with use of grab bar with min assist.  Pt completed bathing at seated level with min/steady assist when bending forward to wash feet and with lateral leans to wash buttocks.  Pt more receptive to instruction this session with questions regarding best technique for dressing, educated on hemi-technique with pt requiring assist to thread LUE into shirt.  Pt able to recall need to thread LLE first when donning pants, continues to require total assist due to decreased sequencing.  Min assist sit > stand for therapist to pull pants up pt assisting on Rt hip.  Pt setup for oral hygiene and left in RN to provide meds.  Therapy Documentation Precautions:  Precautions Precautions: Fall Restrictions Weight Bearing Restrictions: No General:   Vital Signs: Therapy Vitals Pulse Rate: 89 BP: 130/79 mmHg Pain: Pain Assessment Pain Assessment: No/denies pain Pain  Score: 0-No pain  See FIM for current functional status  Therapy/Group: Individual Therapy  [name], Avrie Kedzierski 03/16/2013, 10:13 AM

## 2013-03-16 NOTE — Progress Notes (Signed)
Recreational Therapy Session Note  Patient Details  Name: Gerre Pebblesnthony Erman MRN: [id number] Date of Birth: 02-02-1961 Today's Date: 03/16/2013  Pain: no c/o Skilled Therapeutic Interventions/Progress Updates: Session focused on activity tolerance, dynamic sitting & standing balance, LUE use, scanning to the left, and sustained attention.  Pt stood to wash therapy car with mod assist using RUE.  Pt sat using RUE to wash car with close supervision and then with hand over hand assist using LUE.  Pt required max verbal cuing throughout session, but was less argumentative this session.  Pt participated in animal assisted activity/therapy seated EOB with close supervision.  Pt extremely excited to see therapy dog and initiated bed mobility & sitting EOB independently.  Pt performed bed mobility with supervision, got seated EOB with contact guard assist.  Pt using LUE to pet dog given min cues for encouragement.  Pt talkative throughout visit and continuously stating how much he liked dogs and appreciated this visit.  No c/o pain    Kionte Baumgardner 03/16/2013, 12:11 PM

## 2013-03-16 NOTE — Progress Notes (Addendum)
53 y.o. right-handed male with history of hypertension, tobacco and alcohol abuse. Admitted 02/23/2013 when patient developed left-sided numbness and weakness while at work. Patient reports falling at that time due to weakness on the left. Blood pressure 194/117. CT of the head showed acute parenchymal hemorrhage over the right temporal parietal region measuring 4.9 x 3.2 cm with local mass effect and midline shift of 4 mm. Echocardiogram with ejection fraction 65% no valvular abnormalities. Carotid Dopplers with no ICA stenosis. MRI of the brain 02/24/2013 again showed intraparenchymal hematoma 5.2 x 3.7 x 3.3 cm with surrounding vasogenic edema. Neurosurgery Dr. Conchita ParisNundkumar consult advise conservative management with repeat serial cranial CT scans. Urine drug screen was positive for cocaine as well as marijuana. Maintained on Cardene drip for blood pressure control.  Diet advance to dysphagia 3 thin liquids 03/01/2013. Keppra for seizure prophylaxis  Subjective/Complaints: Rectal pain improved, appetite improving Review of Systems - unable to obtain secondary to reduced level of alertness Objective: Vital Signs: Blood pressure 102/71, pulse 70, temperature 98.1 F (36.7 C), temperature source Oral, resp. rate 17, height 6' (1.829 m), weight 59.8 kg (131 lb 13.4 oz), SpO2 100.00%. No results found. Results for orders placed during the hospital encounter of 03/02/13 (from the past 72 hour(s))  CBC     Status: None   Collection Time    03/14/13  5:45 AM      Result Value Range   WBC 7.5  4.0 - 10.5 K/uL   RBC 4.81  4.22 - 5.81 MIL/uL   Hemoglobin 14.7  13.0 - 17.0 g/dL   HCT 16.141.8  09.639.0 - 04.552.0 %   MCV 86.9  78.0 - 100.0 fL   MCH 30.6  26.0 - 34.0 pg   MCHC 35.2  30.0 - 36.0 g/dL   RDW 40.912.7  81.111.5 - 91.415.5 %   Platelets 287  150 - 400 K/uL      Constitutional: He appears alert, no distress. Resting in bed quietly until I entered room HENT: poor dentition,  Head: Normocephalic. atraumatic Eyes:   Pupils EOMI, without nystagmus  Neck: Normal range of motion. Neck supple. No thyromegaly present.  Cardiovascular: Normal rate and regular rhythm. No murmurs  Respiratory: Effort normal and breath sounds normal. No respiratory distress. No wheezes, rales, rhonchi  GI: Soft. Bowel sounds are normal. He exhibits no distension.  Neurological:  . Oriented to name and hosp. He did follow some simple commands.Loses attention quickly . 1/5 left shoulder ,grip was 2-/5. LLE was 2/5 with HF and KE and trace at the ankle.  Speech moderately dysarthric.  RUE 4/5 RLE 5/5 Skin: Skin is warm and dry.  Psychiatric:  Flat, lethargic MSK:  - SLR  Rectal- no external hemorrhoids, good sphincter tone, didn't tolerate rectal exam beyond anal verge, no masses palpated  Assessment/Plan: 1. 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 Physiatrist is providing close team supervision and 24 hour management of active medical problems listed below. Physiatrist and rehab team continue to assess barriers to discharge/monitor patient progress toward functional and medical goals.  FIM: FIM - Bathing Bathing Steps Patient Completed: Chest;Left Arm;Abdomen;Front perineal area;Right upper leg;Left upper leg Bathing: 3: Mod-Patient completes 5-7 7132f 10 parts or 50-74%  FIM - Upper Body Dressing/Undressing Upper body dressing/undressing steps patient completed: Put head through opening of pull over shirt/dress;Thread/unthread right sleeve of front closure shirt/dress;Pull shirt  over trunk Upper body dressing/undressing: 3: Mod-Patient completed 50-74% of tasks FIM - Lower Body Dressing/Undressing Lower body dressing/undressing steps patient completed: Thread/unthread right pants leg Lower body dressing/undressing: 2: Max-Patient completed 25-49% of tasks  FIM  - Toileting Toileting steps completed by patient: Performs perineal hygiene Toileting Assistive Devices: Grab bar or rail for support Toileting: 1: Total-Patient completed zero steps, helper did all 3  FIM - Diplomatic Services operational officer Devices: Grab bars Toilet Transfers: 3-From toilet/BSC: Mod A (lift or lower assist);3-To toilet/BSC: Mod A (lift or lower assist)  FIM - Bed/Chair Transfer Bed/Chair Transfer Assistive Devices: Arm rests Bed/Chair Transfer: 4: Supine > Sit: Min A (steadying Pt. > 75%/lift 1 leg);3: Sit > Supine: Mod A (lifting assist/Pt. 50-74%/lift 2 legs);3: Bed > Chair or W/C: Mod A (lift or lower assist)  FIM - Locomotion: Wheelchair Distance: 50 Locomotion: Wheelchair: 1: Total Assistance/staff pushes wheelchair (Pt<25%) FIM - Locomotion: Ambulation Locomotion: Ambulation Assistive Devices: Designer, industrial/product Ambulation/Gait Assistance: 1: +2 Total assist Locomotion: Ambulation: 1: Two helpers  Comprehension Comprehension Mode: Auditory Comprehension: 3-Understands basic 50 - 74% of the time/requires cueing 25 - 50%  of the time  Expression Expression Mode: Verbal Expression: 3-Expresses basic 50 - 74% of the time/requires cueing 25 - 50% of the time. Needs to repeat parts of sentences.  Social Interaction Social Interaction: 2-Interacts appropriately 25 - 49% of time - Needs frequent redirection.  Problem Solving Problem Solving: 2-Solves basic 25 - 49% of the time - needs direction more than half the time to initiate, plan or complete simple activities  Memory Memory: 2-Recognizes or recalls 25 - 49% of the time/requires cueing 51 - 75% of the time  Medical Problem List and Plan:  1. 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 DVT Prophylaxis/Anticoagulation: SCDs. Monitor for any  signs of DVT  3. 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 Neuropsych: This patient is not capable of making decisions on his own behalf.  5. Seizure prophylaxis. Keppra 500 mg twice a day. Monitor for any seizure activity  6. Hypertension. Norvasc 10 mg daily,D/C lisinopril 2.5, propranolol 10mg  TID will increase to 20mg m7. Dysphagia.[name] dysphagia 3 thin liquids. Followup speech therapy , trial megace for appetite also on Remeron 8. History of tobacco alcohol abuse. Urine drug screen positive cocaine and marijuana. Provide counseling as appropriate  9.  Rectal pain,KUB full of stool, sm rectal clot passed occ blood with BM but no lg amt, no Hgb drop suspect stercoral ulcer, vs int hemmorhoid vs anal fissure from fecal impaction  LOS (Days) 14 A FACE TO FACE EVALUATION WAS PERFORMED  Cerria Randhawa E 03/16/2013, 7:49 AM

## 2013-03-16 NOTE — Progress Notes (Signed)
Social Work Patient ID: Eric May, male   DOB: Jan 14, 1961, 53 y.o.   MRN: [id number] Met with pt and spoke with [name]-girlfriend and Mom via telephone to inform of progress.  Both pleased with his bowel issues resolved, he was focused on this. Aware he is working more with therapies.  Will begin NH bed search now SSD and Medicaid applied for and pending.  Want him placed close by if possible.

## 2013-03-16 NOTE — Progress Notes (Signed)
Physical Therapy Weekly Progress Note  Patient Details  Name: Eric May MRN: [id number] Date of Birth: 1960-06-01  Today's Date: 03/16/2013 Time: 6761-9509 Time Calculation (min): 46 min  Patient has met 2 of 6 long term goals.  Pt is making slow but steady progress towards LTG with improvements in pain and participating in therapy now that bowel issues seem to be resolving.  Pt is currently min A bed mobility in hospital bed, min A bed <> w/c transfers, min A for w/c mobility short distances and total A-+2 A for gait with wall rail or RW.    Patient continues to demonstrate the following deficits: L hemiparesis with absent sensation and proprioception, L inattention, impaired arousal and cognition with impaired sustained attention, impaired awareness, impaired memory, problem solving and sequencing with very poor frustration tolerance and perseveration, impaired balance, gait, activity tolerance and therefore will continue to benefit from skilled PT intervention to enhance overall performance with activity tolerance, balance, postural control, ability to compensate for deficits, functional use of  left upper extremity and left lower extremity, attention, awareness and coordination.  See Patient's Care Plan for progression toward long term goals.  Patient progressing toward long term goals..  Plan of care revisions: goals upgraded secondary to mobility progress.  Skilled Therapeutic Interventions/Progress Updates:   Co-treat with recreation therapy with focus on functional activity that is meaningful to pt from activities that he performed PTA.  Pt not reporting pain but just received suppository for hemorrhoids and perseverating on continued bowel issues and also perseverating on being cold.  Able to distract pt for short periods of time with simulated car washing tasks with pt using wash cloth and towel with RUE to wash and dry doors on car simulator with intermittent attempts to attend to LUE  use to wash and dry car.  While using LUE pt perseverating and easily frustrated with absent sensation in LUE.  Following car washing pt reporting need to have BM. Returned to room and performed transfer w/c <> toilet with grab bar and min A.  Pt performed hygiene but required assistance for clothing management.  Returned to w/c min A and w/c > bed and sit > supine min A.    Therapy Documentation Precautions:  Precautions Precautions: Fall Restrictions Weight Bearing Restrictions: No Vital Signs: Therapy Vitals Pulse Rate: 89 BP: 130/79 mmHg Pain: Pain Assessment Pain Assessment: No/denies pain Pain Score: 0-No pain  See FIM for current functional status  Therapy/Group: Individual Therapy and Co-Treatment with Recreation therapy  Raylene Everts [facility] 03/16/2013, 12:24 PM

## 2013-03-16 NOTE — Progress Notes (Signed)
Speech Language Pathology Daily Session Note  Patient Details  Name: Gerre Pebblesnthony Schuff MRN: [id number] Date of Birth: 03/23/1960  Today's Date: 03/16/2013 Time: [id number] Time Calculation (min): 25 min  Short Term Goals: Week 2: SLP Short Term Goal 1 (Week 2): Patient will sustain attention to basic self-care task for 5-8 minutes with Max clinician cues. SLP Short Term Goal 2 (Week 2): Patient will solve basic problems with Max verbal cues. SLP Short Term Goal 3 (Week 2): Patient will label 2 physical and 1 cognitive deficit with Max questions cues. SLP Short Term Goal 4 (Week 2): Patient will consume regular textures and thin liquids with Mod cues for safety to prevent overt s/s of aspiration.  SLP Short Term Goal 5 (Week 2): Patient will maintain topic of conversation for 3 turns with Max assist clinician cues  Skilled Therapeutic Interventions: Skilled treatment session focused on addressing dysphagia and cognition goals.  SLP facilitated session with set-up assist and Min verbal cues for pacing and portion control and management of left pocketing while consuming Dys.3 textures and thin liquids via cuo.  Despite large boluses patient demonstrated no overt s/s of aspiration. Patient demonstrated improved participation, attention and awareness throughout session. Continue with curren plan of care.[name].      FIM:  Comprehension Comprehension Mode: Auditory Comprehension: 3-Understands basic 50 - 74% of the time/requires cueing 25 - 50%  of the time Expression Expression Mode: Verbal Expression: 3-Expresses basic 50 - 74% of the time/requires cueing 25 - 50% of the time. Needs to repeat parts of sentences. Social Interaction Social Interaction: 3-Interacts appropriately 50 - 74% of the time - May be physically or verbally inappropriate. Problem Solving Problem Solving: 3-Solves basic 50 - 74% of the time/requires cueing 25 - 49% of the time Memory Memory: 3-Recognizes or recalls 50 - 74% of  the time/requires cueing 25 - 49% of the time FIM - Eating Eating Activity: 4: Helper checks for pocketed food;5: Needs verbal cues/supervision;5: Set-up assist for open containers  Pain Pain Assessment Pain Assessment: No/denies pain  Therapy/Group: Individual Therapy  Charlane FerrettiMelissa Katoya Amato, M.A., CCC-SLP [phone]  Adolfo Granieri 03/16/2013, 1:50 PM

## 2013-03-16 NOTE — Patient Care Conference (Signed)
Inpatient RehabilitationTeam Conference and Plan of Care Update Date: 03/16/2013   Time: 11;25 AM    Patient Name: Eric May      Medical Record Number: [id number]  Date of Birth: [birth year] Sex: Male         Room/Bed: 4W11C/4W11C-01 Payor Info: Payor: MEDICAID PENDING / Plan: MEDICAID PENDING / Product Type: *No Product type* /    Admitting Diagnosis: ICH  Admit Date/Time:  03/02/2013  6:21 PM Admission Comments: No comment available   Primary Diagnosis:  <principal problem not specified> Principal Problem: <principal problem not specified>  Patient Active Problem List   Diagnosis Date Noted  . ICH (intracerebral hemorrhage) 03/02/2013  . Convulsions/seizures 02/24/2013  . Intracerebral hemorrhage 02/23/2013    Expected Discharge Date:    Team Members Present: Physician leading conference: Dr. Claudette LawsAndrew Kirsteins Social Worker Present: Staci AcostaJenny Prevatt, LCSW;Becky Leaman Abe, LCSW Nurse Present: Other (comment) Cala Bradford(Kimberly Cook-RN) PT Present: Harriet ButteEmily Parcell, Varney BilesPT;[name] Hall, PT OT Present: Rosalio LoudSarah Hoxie, OT;Kris Gellert, Heath LarkT;James McGuire, OT SLP Present: Fae PippinMelissa Bowie, SLP PPS Coordinator present : Tora DuckMarie Noel, RN, CRRN     Current Status/Progress Goal Weekly Team Focus  Medical   Constipation improving, small amount of rectal bleeding associated with local trauma  Maintain medical stability  Optimize bowel program   Bowel/Bladder   Incontinent of bowel and bladder LBM 03-15-13, condom cath   Continent of bowel and bladder  Timed toileting q 2hrs during day, condom cath hs   Swallow/Nutrition/ Hydration   Dys.3 textures and thin liquids   least restrictive p.o. intake   increase participation, safety, self-monitorig and correcting   ADL's   limited participation.  mod assist bathing, mod assist UB dressing, total assist LB dressing, min-max assist transfers  mod assist LB self-cares and transfers, min assist UB and grooming  participation in treatment session, LUE NM re-ed, self-care  retraining   Mobility   Min-mod A overall, max A gait with wall rail  Min A transfers and w/c propulsion, max A gait  Participation and attention, safety with transfers, gait, w/c mobility   Communication      Min assist   increase participation, self-monitoring and corecting   Safety/Cognition/ Behavioral Observations  Max assist   Mod assist   increase participation, attention and safety awareness    Pain   pain manage with vicodin 1 tab  <3   Assess pain level q shift and prn   Skin   N/A  N/A  Assess skin q shift and prn      *See Care Plan and progress notes for long and short-term goals.  Barriers to Discharge: Will require 24 hour care post discharge    Possible Resolutions to Barriers:  SNF vs. caregiver training    Discharge Planning/Teaching Needs:  Pt has applied for SSD and Medicaid now, will begin bed search for NH      Team Discussion:  Participating more, bowel issues resolved.  Shower today, perservates on one thing.  Dys 3 thin.  Applied for SSD and medicaid-looking for NH bed  Revisions to Treatment Plan:  NHP   Continued Need for Acute Rehabilitation Level of Care: The patient requires daily medical management by a physician with specialized training in physical medicine and rehabilitation for the following conditions: Daily direction of a multidisciplinary physical rehabilitation program to ensure safe treatment while eliciting the highest outcome that is of practical value to the patient.: Yes Daily medical management of patient stability for increased activity during participation in an intensive rehabilitation regime.:  Yes Daily analysis of laboratory values and/or radiology reports with any subsequent need for medication adjustment of medical intervention for : Neurological problems;Other  Lucy Chris 03/16/2013, 3:42 PM

## 2013-03-17 ENCOUNTER — Inpatient Hospital Stay (HOSPITAL_COMMUNITY): Payer: Medicaid Other | Admitting: Occupational Therapy

## 2013-03-17 ENCOUNTER — Inpatient Hospital Stay (HOSPITAL_COMMUNITY): Payer: Medicaid Other | Admitting: Speech Pathology

## 2013-03-17 ENCOUNTER — Inpatient Hospital Stay (HOSPITAL_COMMUNITY): Payer: Medicaid Other | Admitting: Physical Therapy

## 2013-03-17 NOTE — Progress Notes (Addendum)
53 y.o. right-handed male with history of hypertension, tobacco and alcohol abuse. Admitted 02/23/2013 when patient developed left-sided numbness and weakness while at work. Patient reports falling at that time due to weakness on the left. Blood pressure 194/117. CT of the head showed acute parenchymal hemorrhage over the right temporal parietal region measuring 4.9 x 3.2 cm with local mass effect and midline shift of 4 mm. Echocardiogram with ejection fraction 65% no valvular abnormalities. Carotid Dopplers with no ICA stenosis. MRI of the brain 02/24/2013 again showed intraparenchymal hematoma 5.2 x 3.7 x 3.3 cm with surrounding vasogenic edema. Neurosurgery Dr. Conchita ParisNundkumar consult advise conservative management with repeat serial cranial CT scans. Urine drug screen was positive for cocaine as well as marijuana. Maintained on Cardene drip for blood pressure control.  Diet advance to dysphagia 3 thin liquids 03/01/2013. Keppra for seizure prophylaxis  Subjective/Complaints: Rectal pain improved, still pain with suppository insertion appetite improving, fluid intake adequate Review of Systems - unable to obtain secondary to reduced level of alertness Objective: Vital Signs: Blood pressure 116/78, pulse 67, temperature 98.4 F (36.9 C), temperature source Oral, resp. rate 16, height 6' (1.829 m), weight 59.8 kg (131 lb 13.4 oz), SpO2 98.00%. No results found. No results found for this or any previous visit (from the past 72 hour(s)).    Constitutional: He appears alert, no distress. Resting in bed quietly until I entered room HENT: poor dentition,  Head: Normocephalic. atraumatic Eyes:  Pupils EOMI, without nystagmus  Neck: Normal range of motion. Neck supple. No thyromegaly present.  Cardiovascular: Normal rate and regular rhythm. No murmurs  Respiratory: Effort normal and breath sounds normal. No respiratory distress. No wheezes, rales, rhonchi  GI: Soft. Bowel sounds are normal. He exhibits no  distension.  Neurological:  . Oriented to name and hosp. He did follow some simple commands.Loses attention quickly . 1/5 left shoulder ,grip was 2-/5. LLE was 2/5 with HF and KE and trace at the ankle.  Speech moderately dysarthric.  RUE 4/5 RLE 5/5 Skin: Skin is warm and dry.  Psychiatric:  Flat, lethargic MSK:  - SLR  Rectal- no external hemorrhoids, good sphincter tone, didn't tolerate rectal exam beyond anal verge, no masses palpated  Assessment/Plan: 1. 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 Physiatrist is providing close team supervision and 24 hour management of active medical problems listed below. Physiatrist and rehab team continue to assess barriers to discharge/monitor patient progress toward functional and medical goals.  FIM: FIM - Bathing Bathing Steps Patient Completed: Chest;Left Arm;Abdomen;Front perineal area;Right upper leg;Left upper leg;Buttocks;Right lower leg (including foot);Left lower leg (including foot) Bathing: 4: Min-Patient completes 8-9 1291f 10 parts or 75+ percent  FIM - Upper Body Dressing/Undressing Upper body dressing/undressing steps patient completed: Thread/unthread right sleeve of pullover shirt/dresss;Put head through opening of pull over shirt/dress Upper body dressing/undressing: 3: Mod-Patient completed 50-74% of tasks FIM - Lower Body Dressing/Undressing Lower body dressing/undressing steps patient completed: Thread/unthread right pants leg Lower body dressing/undressing: 1: Total-Patient completed less than 25% of tasks  FIM - Toileting Toileting steps completed by patient: Performs perineal hygiene Toileting Assistive Devices: Grab bar or rail for support Toileting: 2: Max-Patient completed 1 of 3 steps  FIM - Diplomatic Services operational officerToilet Transfers Toilet Transfers Assistive Devices: Grab  bars Toilet Transfers: 3-To toilet/BSC: Mod A (lift or lower assist);3-From toilet/BSC: Mod A (lift or lower assist)  FIM -  Bed/Chair Transport planner Devices: Arm rests Bed/Chair Transfer: 4: Sit > Supine: Min A (steadying pt. > 75%/lift 1 leg);4: Bed > Chair or W/C: Min A (steadying Pt. > 75%);4: Chair or W/C > Bed: Min A (steadying Pt. > 75%)  FIM - Locomotion: Wheelchair Distance: 50 Locomotion: Wheelchair: 1: Total Assistance/staff pushes wheelchair (Pt<25%) FIM - Locomotion: Ambulation Locomotion: Ambulation Assistive Devices: Designer, industrial/product Ambulation/Gait Assistance: 1: +2 Total assist Locomotion: Ambulation: 0: Activity did not occur  Comprehension Comprehension Mode: Auditory Comprehension: 3-Understands basic 50 - 74% of the time/requires cueing 25 - 50%  of the time  Expression Expression Mode: Verbal Expression: 3-Expresses basic 50 - 74% of the time/requires cueing 25 - 50% of the time. Needs to repeat parts of sentences.  Social Interaction Social Interaction: 3-Interacts appropriately 50 - 74% of the time - May be physically or verbally inappropriate.  Problem Solving Problem Solving: 3-Solves basic 50 - 74% of the time/requires cueing 25 - 49% of the time  Memory Memory: 3-Recognizes or recalls 50 - 74% of the time/requires cueing 25 - 49% of the time  Medical Problem List and Plan:  1. 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 DVT Prophylaxis/Anticoagulation: SCDs. Monitor for any signs of DVT  3. 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 Neuropsych: This patient is not capable of making decisions on his own behalf.  5. Seizure prophylaxis. Keppra 500 mg twice a day. Monitor for any seizure activity  6.  Hypertension. Norvasc 10 mg daily,D/C lisinopril 2.5, propranolol 10mg  TID will increase to 20mg m7. Dysphagia.[name] dysphagia 3 thin liquids. Followup speech therapy , trial megace for appetite also on Remeron 8. History of tobacco alcohol abuse. Urine drug screen positive cocaine and marijuana. Provide counseling as appropriate  9.  Rectal pain,KUB full of stool, sm rectal clot passed occ blood with BMnow only has pain with Supp insertion, no clear cut evidence of hemmorhoid will d/c and monitor   LOS (Days) 15 A FACE TO FACE EVALUATION WAS PERFORMED  KIRSTEINS,ANDREW E 03/17/2013, 6:28 AM

## 2013-03-17 NOTE — Progress Notes (Signed)
Speech Language Pathology Weekly Progress Note  Patient Details  Name: Eric May MRN: [id number] Date of Birth: Jul 16, 1960  Today's Date: 03/17/2013  Short Term Goals: Week 2: SLP Short Term Goal 1 (Week 2): Patient will sustain attention to basic self-care task for 5-8 minutes with Max clinician cues. SLP Short Term Goal 1 - Progress (Week 2): Met SLP Short Term Goal 2 (Week 2): Patient will solve basic problems with Max verbal cues. SLP Short Term Goal 2 - Progress (Week 2): Met SLP Short Term Goal 3 (Week 2): Patient will label 2 physical and 1 cognitive deficit with Max questions cues. SLP Short Term Goal 3 - Progress (Week 2): Met SLP Short Term Goal 4 (Week 2): Patient will consume regular textures and thin liquids with Mod cues for safety to prevent overt s/s of aspiration.  SLP Short Term Goal 4 - Progress (Week 2): Progressing toward goal SLP Short Term Goal 5 (Week 2): Patient will maintain topic of conversation for 3 turns with Max assist clinician cues SLP Short Term Goal 5 - Progress (Week 2): Met  Week 3: SLP Short Term Goal 1 (Week 3): Patient will sustain attention to basic self-care task for 5-8 minutes with Mod clinician cues. SLP Short Term Goal 2 (Week 3): Patient will solve basic problems with Mod verbal cues. SLP Short Term Goal 3 (Week 3): Patient will request help with Max questions cues. SLP Short Term Goal 4 (Week 3): Patient will consume regular textures and thin liquids with Mod cues for portion control to prevent overt s/s of aspiration.  SLP Short Term Goal 5 (Week 3): Patient will maintain topic of conversation for 3 turns with Mod assist clinician cues  Weekly Progress Updates: Patient met 4 out of 5 short terms goals during this reporting period due to gains in sustained attention for short periods of time, intellectual awareness, basic problem solving and topic maintenance with Max assist.  Patient has participated in trials of regular textures;  however, his ability to self-monitor and control pace and portion control continue to be limiting factors and continue to impact safety.  He continues to require Max assist to request help as needed and continues to benefit from skilled services to reduce cuing/assist.   As a result, patient continues to require skilled SLP services to address his cognitive-linguistic awareness to maximize functional communication and safety with basic self-care tasks and reduce burden of care prior to discharge.    SLP Intensity: Minumum of 1-2 x/day, 30 to 90 minutes SLP Frequency: 5 out of 7 days SLP Duration/Estimated Length of Stay: SNF SLP Treatment/Interventions: Cognitive remediation/compensation;Cueing hierarchy;Dysphagia/aspiration precaution training;Environmental controls;Functional tasks;Internal/external aids;Patient/family education;Therapeutic Activities  Carmelia Roller., CCC-SLP [phone]  [place] 03/17/2013, 8:25 AM

## 2013-03-17 NOTE — Progress Notes (Signed)
Occupational Therapy Session Note  Patient Details  Name: Eric May MRN: [id number] Date of Birth: February 17, 1961  Today's Date: 03/17/2013 Time: [id number] Time Calculation (min): 45 min  Short Term Goals: Week 2:  OT Short Term Goal 1 (Week 2): Pt will complete UB dressing with min assist OT Short Term Goal 2 (Week 2): Pt will complete LB dressing with max assist OT Short Term Goal 3 (Week 2): Pt will complete bathing with min assist and min verbal cues for initiation and sequencing. OT Short Term Goal 4 (Week 2): Pt will complete toilet transfer with mod assist 3 consecutive sessions. OT Short Term Goal 5 (Week 2): Pt will complete shower transfer with mod assist.  Skilled Therapeutic Interventions/Progress Updates:    Pt seen for ADL retraining with focus on transfers, sit > stand, and increased participation in self-care tasks. Pt in bathroom with nurse tech attempting to have BM.  Pt requested to take shower this session. Performed stand pivot to w/c then stand pivot to shower seat in walk-in shower with use of grab bar with min assist, pt requires manual facilitation at Lt knee to bend due to hyperextension in standing. Pt completed bathing at seated level with min/steady assist when bending forward to wash feet and with lateral leans to wash buttocks. Pt with increased awareness of deficits throughout session, continuing to perseverate on LUE and LLE "just not working right". Educated on hemi-dressing technique with pt threading LUE through under shirt, but requiring assist with long sleeved shirt.  Pt able to kick out LLE but unable to maintain while attempting to thread pant leg.  Pt thread RLE and assisted with pulling pants over hips in standing. Completed oral hygiene seated at sink with setup assist.  Pt returned to bed with min assist squat pivot transfer with increased carryover of sequencing.  Therapy Documentation Precautions:  Precautions Precautions:  Fall Restrictions Weight Bearing Restrictions: No Pain:  Pt with no c/o pain this session.  See FIM for current functional status  Therapy/Group: Individual Therapy  [name], [name] 03/17/2013, 11:15 AM

## 2013-03-17 NOTE — Progress Notes (Signed)
Speech Language Pathology Daily Session Note  Patient Details  Name: Eric May MRN: [id number] Date of Birth: Jul 02, 1960  Today's Date: 03/17/2013 Time: 1300-1350 Time Calculation (min): 50 min  Short Term Goals: Week 3: SLP Short Term Goal 1 (Week 3): Patient will sustain attention to basic self-care task for 5-8 minutes with Mod clinician cues. SLP Short Term Goal 2 (Week 3): Patient will solve basic problems with Mod verbal cues. SLP Short Term Goal 3 (Week 3): Patient will request help with Max questions cues. SLP Short Term Goal 4 (Week 3): Patient will consume regular textures and thin liquids with Mod cues for portion control to prevent overt s/s of aspiration.  SLP Short Term Goal 5 (Week 3): Patient will maintain topic of conversation for 3 turns with Mod assist clinician cues  Skilled Therapeutic Interventions: Skilled treatment session focused on addressing dysphagia and cognition goals.  Patient reported needing to use the bathroom with Min-Mod cues to sequence and problem solve self-care task.  SLP also facilitated session with partial set-up assist and Min verbal cues for pacing and portion control and management of left pocketing while consuming Dys.3 textures and thin liquids via cup.  Despite large boluses patient demonstrated no overt s/s of aspiration.  Recommend to order trial tray of regular textures and thin liquids tomorrow.     FIM:  Comprehension Comprehension Mode: Auditory Comprehension: 4-Understands basic 75 - 89% of the time/requires cueing 10 - 24% of the time Expression Expression: 4-Expresses basic 75 - 89% of the time/requires cueing 10 - 24% of the time. Needs helper to occlude trach/needs to repeat words. Social Interaction Social Interaction: 4-Interacts appropriately 75 - 89% of the time - Needs redirection for appropriate language or to initiate interaction. Problem Solving Problem Solving: 3-Solves basic 50 - 74% of the time/requires cueing 25  - 49% of the time Memory Memory: 3-Recognizes or recalls 50 - 74% of the time/requires cueing 25 - 49% of the time FIM - Eating Eating Activity: 4: Helper checks for pocketed food;5: Needs verbal cues/supervision;5: Set-up assist for open containers  Pain Pain Assessment Pain Assessment: No/denies pain  Therapy/Group: Individual Therapy  Charlane FerrettiMelissa Nery Kalisz, M.A., CCC-SLP [phone]  Atilla Zollner 03/17/2013, 4:56 PM

## 2013-03-17 NOTE — Progress Notes (Signed)
Physical Therapy Session Note  Patient Details  Name: Eric May MRN: [id number] Date of Birth: [birth year]  Today's Date: 03/17/2013 Time: 1003-1103 Time Calculation (min): 60 min  Skilled Therapeutic Interventions/Progress Updates:   Pt resting in bed calmly.  No c/o pain and pt fully agreeable to therapy.  Performed supine > sit supervision.  Assisted pt with donning shoes EOB and performed transfer bed > w/c min A.  Performed w/c mobility x 150' with R hemi technique and min A to maintain straight navigation and obstacle negotiation/avoidance on L side.  Pt set up at Nustep and performed transfers w/c <> Nustep min A.  Pt performed Nustep at level 5 x 8:44 minutes with bilat UE and LE for L sided strengthening, attention to L and attention to task.  Pt did stop multiple times during exercise becoming distracted by sensation impairments, bowel issues, etc.  Pt was pleased with Nustep and felt he was really working his L side.  Performed stair negotiation training with max A for balance, to maintain LLE placement on rail and assistance to fully advance LLE to next step and stabilize during L stance.  During rest break pt became very upset about his vision.  Pt reporting not being able to see out of the L eye and pt upset that no one has addressed or focused on his vision.  With gross assessment pt appears to have L visual field cut and discussed with pt that there are no exercises to fix his vision but to be aware of it and to practice scanning and turning his head to find objects on L.  Pt verbalized understanding.  Returned to room and to bed with min A to rest.    Therapy Documentation Precautions:  Precautions Precautions: Fall Restrictions Weight Bearing Restrictions: No Pain: Pain Assessment Pain Assessment: No/denies pain Locomotion : Stairs / Additional Locomotion Stairs: Yes Stairs Assistance: 2: Max assist Stair Management Technique: Two rails;Step to pattern;Forwards Number  of Stairs: 5 Height of Stairs: 6 Wheelchair Mobility Wheelchair Mobility: Yes Wheelchair Assistance: 4: Systems analystMin assist Wheelchair Propulsion: Right upper extremity;Right lower extremity Wheelchair Parts Management: Needs assistance Distance: 150   See FIM for current functional status  Therapy/Group: Individual Therapy  Edman CircleHall, Verbon Giangregorio [facility] 03/17/2013, 12:38 PM

## 2013-03-17 NOTE — Progress Notes (Signed)
NUTRITION FOLLOW UP  Intervention:   Pt currently eating excellent, 100% meal and supplement intake, RD signing off, please consult if needed.   NUTRITION DIAGNOSIS:  Inadequate oral intake related to poor appetite as evidenced by PO intake 0-25% of meals - resolved  Monitor:  1. Food/Beverage; pt meeting >/=90% estimated needs with tolerance - met  2. Wt/wt change; monitor trends - pt's weight down 1 pound since admission   Assessment:   Pt admitted with deconditioning r/t right temporoparietal ICH.  1/15 - Pt's intake has not improved.  Remains poor at 15% of meals.  MD started Megace today. Pt is refusing supplements.  Will d/c ensure and order Breeze.  Met with pt who is uncomfortable during visit.  States he is having pain "everytime I go to the bathroom".  Pt reports he doesn't like Ensure, but unable to state why. Encouraged intake.   1/22 - Had rapid response 1/17 for hypotension and unresponsiveness. C/o rectal pain 1/18 and blood was noted in his diaper, x-ray showed fecal impaction and pt was given Dulcolax suppository and Colace and had multiple BMs that evening. Had intermittent rectal pain 1/20 and had 3 BMs with scant amount of blood noted from hemorrhoids. Attempted multiple times to visit with pt today, however pt on the phone each time. Per conversation with RN, pt eating 100% of meals and supplements and reports good appetite. Did not complain of any rectal pain to RN today.   Height: Ht Readings from Last 1 Encounters:  03/02/13 6' (1.829 m)    Weight Status:   Wt Readings from Last 1 Encounters:  03/16/13 131 lb 13.4 oz (59.8 kg)  Admit wt:        132 lb 7.9 oz (60.1 kg)  Re-estimated needs:  Kcal: 2100-2280  Protein: 90-110g  Fluid: >2.1 L/day  Skin: intact  Diet Order: Dysphagia 3, thin   Intake/Output Summary (Last 24 hours) at 03/17/13 1519 Last data filed at 03/17/13 0844  Gross per 24 hour  Intake    960 ml  Output    850 ml  Net    110 ml     Last BM: 1/20   Labs:   Recent Labs Lab 03/12/13 1709  NA 130*  K 4.8  CL 90*  CO2 24  BUN 33*  CREATININE 1.34  CALCIUM 10.3  GLUCOSE 102*    CBG (last 3)  No results found for this basename: GLUCAP,  in the last 72 hours  Scheduled Meds: . amLODipine  10 mg Oral Daily  . antiseptic oral rinse  15 mL Mouth Rinse BID  . feeding supplement (RESOURCE BREEZE)  1 Container Oral TID BM  . folic acid  1 mg Oral Daily  . gabapentin  100 mg Oral TID  . hydrocortisone  25 mg Rectal BID  . levETIRAcetam  500 mg Oral BID  . megestrol  400 mg Oral BID  . mirtazapine  15 mg Oral QHS  . multivitamin with minerals  1 tablet Oral Daily  . pantoprazole  40 mg Oral Daily  . potassium chloride  20 mEq Oral Daily  . propranolol  20 mg Oral TID  . QUEtiapine  50 mg Oral QHS  . senna-docusate  3 tablet Oral BID  . sorbitol, milk of mag, mineral oil, glycerin (SMOG) enema  960 mL Rectal Once  . thiamine  100 mg Oral Daily     Mikey College MS, RD, [place] [phone] Pager [phone] After Hours Pager

## 2013-03-18 ENCOUNTER — Inpatient Hospital Stay (HOSPITAL_COMMUNITY): Payer: Medicaid Other | Admitting: Physical Therapy

## 2013-03-18 ENCOUNTER — Inpatient Hospital Stay (HOSPITAL_COMMUNITY): Payer: Medicaid Other | Admitting: Speech Pathology

## 2013-03-18 ENCOUNTER — Encounter (HOSPITAL_COMMUNITY): Payer: Self-pay | Admitting: Occupational Therapy

## 2013-03-18 DIAGNOSIS — G8110 Spastic hemiplegia affecting unspecified side: Secondary | ICD-10-CM

## 2013-03-18 DIAGNOSIS — R209 Unspecified disturbances of skin sensation: Secondary | ICD-10-CM

## 2013-03-18 DIAGNOSIS — I69998 Other sequelae following unspecified cerebrovascular disease: Secondary | ICD-10-CM

## 2013-03-18 DIAGNOSIS — I619 Nontraumatic intracerebral hemorrhage, unspecified: Secondary | ICD-10-CM

## 2013-03-18 LAB — BASIC METABOLIC PANEL
BUN: 10 mg/dL (ref 6–23)
CO2: 20 mEq/L (ref 19–32)
Calcium: 9.3 mg/dL (ref 8.4–10.5)
Chloride: 108 mEq/L (ref 96–112)
Creatinine, Ser: 0.68 mg/dL (ref 0.50–1.35)
GFR calc Af Amer: >90 mL/min (ref >90)
GFR calc non Af Amer: >90 mL/min (ref >90)
GLUCOSE: 82 mg/dL (ref 70–99)
POTASSIUM: 4.1 meq/L (ref 3.7–5.3)
SODIUM: 140 meq/L (ref 137–147)

## 2013-03-18 MED ORDER — ALUM & MAG HYDROXIDE-SIMETH 200-200-20 MG/5ML PO SUSP
30.0000 mL | Freq: Once | ORAL | Status: AC
  Administered 2013-03-18: 30 mL via ORAL
  Filled 2013-03-18: qty 30

## 2013-03-18 MED ORDER — NICOTINE 21 MG/24HR TD PT24
21.0000 mg | MEDICATED_PATCH | Freq: Every day | TRANSDERMAL | Status: DC
  Administered 2013-03-19 – 2013-04-06 (×19): 21 mg via TRANSDERMAL
  Filled 2013-03-18 (×24): qty 1

## 2013-03-18 NOTE — Progress Notes (Signed)
Occupational Therapy Note  Patient Details  Name: Eric May MRN: [id number] Date of Birth: [birth year] Today's Date: 03/18/2013  Pt missed 45 mins skilled OT treatment session secondary to on the phone and unable to redirect to engage in treatment session.  Spoke with RN with multiple attempts to provide meds, but pt focused on phone call.  After discussion with PT, found out pt had a death in the family and preoccupied with speaking with family members during scheduled therapy.  Will follow up as able.  Eric May, Eric May 03/18/2013, 12:03 PM

## 2013-03-18 NOTE — Progress Notes (Signed)
Physical Therapy Session Note  Patient Details  Name: Eric May MRN: [id number] Date of Birth: March 31, 1960  Today's Date: 03/18/2013 Time: 7829-56211044-1120 Time Calculation (min): 36 min  Skilled Therapeutic Interventions/Progress Updates:   Pt up in w/c with quick release belt in place.  Pt reports being on phone a lot this am secondary to a family member passing away last night after a car accident.  Pt requesting to get washed up since he missed his shower.  Set pt up at sink with soap and wash cloth and pt performed face, trunk and perineal area washing at w/c level with supervision while sitting but with min A to maintain standing to wash perineal area.  Also assisted pt with donning clothing in sitting and standing with min A to balance in standing to pull up pants.  Pt transferred w/c > recliner with min A and pt set up with LE elevated and quick release belt in place to rest and make more phone calls about family.  Pt tolerated well without frustration or agitation; sequenced and found objects on L and performed some washing with LUE with min cues.    Therapy Documentation Precautions:  Precautions Precautions: Fall Restrictions Weight Bearing Restrictions: No Pain: Pain Assessment Pain Assessment: No/denies pain  See FIM for current functional status  Therapy/Group: Individual Therapy  Edman CircleHall, Alisia Vanengen [facility] 03/18/2013, 12:43 PM

## 2013-03-18 NOTE — Progress Notes (Signed)
53 y.o. right-handed male with history of hypertension, tobacco and alcohol abuse. Admitted 02/23/2013 when patient developed left-sided numbness and weakness while at work. Patient reports falling at that time due to weakness on the left. Blood pressure 194/117. CT of the head showed acute parenchymal hemorrhage over the right temporal parietal region measuring 4.9 x 3.2 cm with local mass effect and midline shift of 4 mm. Echocardiogram with ejection fraction 65% no valvular abnormalities. Carotid Dopplers with no ICA stenosis. MRI of the brain 02/24/2013 again showed intraparenchymal hematoma 5.2 x 3.7 x 3.3 cm with surrounding vasogenic edema. Neurosurgery Dr. Kathyrn Sheriff consult advise conservative management with repeat serial cranial CT scans. Urine drug screen was positive for cocaine as well as marijuana. Maintained on Cardene drip for blood pressure control.  Diet advance to dysphagia 3 thin liquids 03/01/2013. Keppra for seizure prophylaxis  Subjective/Complaints: Rectal pain improved, slept ok, no Left arm or leg pain Review of Systems - unable to obtain secondary to reduced level of alertness Objective: Vital Signs: Blood pressure 128/86, pulse 63, temperature 98.1 F (36.7 C), temperature source Oral, resp. rate 18, height 6' (1.829 m), weight 59.8 kg (131 lb 13.4 oz), SpO2 100.00%. No results found. Results for orders placed during the hospital encounter of 03/02/13 (from the past 72 hour(s))  BASIC METABOLIC PANEL     Status: None   Collection Time    03/18/13  6:00 AM      Result Value Range   Sodium 140  137 - 147 mEq/L   Potassium 4.1  3.7 - 5.3 mEq/L   Chloride 108  96 - 112 mEq/L   CO2 20  19 - 32 mEq/L   Glucose, Bld 82  70 - 99 mg/dL   BUN 10  6 - 23 mg/dL   Creatinine, Ser 0.68  0.50 - 1.35 mg/dL   Calcium 9.3  8.4 - 10.5 mg/dL   GFR calc non Af Amer >90  >90 mL/min   GFR calc Af Amer >90  >90 mL/min   Comment: (NOTE)     The eGFR has been calculated using the CKD EPI  equation.     This calculation has not been validated in all clinical situations.     eGFR's persistently <90 mL/min signify possible Chronic Kidney     Disease.      Constitutional: He appears alert, no distress. Resting in bed quietly until I entered room HENT: poor dentition,  Head: Normocephalic. atraumatic Eyes:  Pupils EOMI, without nystagmus  Neck: Normal range of motion. Neck supple. No thyromegaly present.  Cardiovascular: Normal rate and regular rhythm. No murmurs  Respiratory: Effort normal and breath sounds normal. No respiratory distress. No wheezes, rales, rhonchi  GI: Soft. Bowel sounds are normal. He exhibits no distension.  Neurological:  . Oriented to name and hosp. He did follow some simple commands.Loses attention quickly . 1/5 left shoulder ,grip was 2-/5. LLE was 2/5 with HF and KE and trace at the ankle.  Speech moderately dysarthric.  RUE 4/5 RLE 5/5 Skin: Skin is warm and dry.  Psychiatric:  Flat, lethargic MSK:  - SLR  Rectal- no external hemorrhoids, good sphincter tone, didn't tolerate rectal exam beyond anal verge, no masses palpated  Assessment/Plan: 1. 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 Physiatrist is providing close team supervision and 24 hour management of active medical problems listed below.  Physiatrist and rehab team continue to assess barriers to discharge/monitor patient progress toward functional and medical goals.  FIM: FIM - Bathing Bathing Steps Patient Completed: Chest;Left Arm;Abdomen;Front perineal area;Right upper leg;Left upper leg;Buttocks;Right lower leg (including foot);Left lower leg (including foot) Bathing: 4: Min-Patient completes 8-9 95f10 parts or 75+ percent  FIM - Upper Body Dressing/Undressing Upper body dressing/undressing steps patient completed:  Thread/unthread right sleeve of pullover shirt/dresss;Put head through opening of pull over shirt/dress Upper body dressing/undressing: 3: Mod-Patient completed 50-74% of tasks FIM - Lower Body Dressing/Undressing Lower body dressing/undressing steps patient completed: Thread/unthread right pants leg Lower body dressing/undressing: 1: Total-Patient completed less than 25% of tasks  FIM - Toileting Toileting steps completed by patient: Performs perineal hygiene Toileting Assistive Devices: Grab bar or rail for support Toileting: 2: Max-Patient completed 1 of 3 steps  FIM - TRadio producerDevices: GProduct managerTransfers: 3-From toilet/BSC: Mod A (lift or lower assist)  FIM - BControl and instrumentation engineerDevices: Arm rests Bed/Chair Transfer: 5: Supine > Sit: Supervision (verbal cues/safety issues);4: Sit > Supine: Min A (steadying pt. > 75%/lift 1 leg);4: Bed > Chair or W/C: Min A (steadying Pt. > 75%);4: Chair or W/C > Bed: Min A (steadying Pt. > 75%)  FIM - Locomotion: Wheelchair Distance: 150 Locomotion: Wheelchair: 4: Travels 150 ft or more: maneuvers on rugs and over door sillls with minimal assistance (Pt.>75%) FIM - Locomotion: Ambulation Locomotion: Ambulation Assistive Devices: WAdministratorAmbulation/Gait Assistance: 1: +2 Total assist Locomotion: Ambulation: 0: Activity did not occur  Comprehension Comprehension Mode: Auditory Comprehension: 4-Understands basic 75 - 89% of the time/requires cueing 10 - 24% of the time  Expression Expression Mode: Verbal Expression: 4-Expresses basic 75 - 89% of the time/requires cueing 10 - 24% of the time. Needs helper to occlude trach/needs to repeat words.  Social Interaction Social Interaction: 4-Interacts appropriately 75 - 89% of the time - Needs redirection for appropriate language or to initiate interaction.  Problem Solving Problem Solving: 3-Solves basic 50 - 74% of the  time/requires cueing 25 - 49% of the time  Memory Memory: 3-Recognizes or recalls 50 - 74% of the time/requires cueing 25 - 49% of the time  Medical Problem List and Plan:  1. 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 DVT Prophylaxis/Anticoagulation: SCDs. Monitor for any signs of DVT  3. 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 Neuropsych: This patient is not capable of making decisions on his own behalf.  5. Seizure prophylaxis. Keppra 500 mg twice a day. Monitor for any seizure activity  6. Hypertension. Norvasc 10 mg daily,D/C lisinopril 2.5, propranolol 169mTID will increase to 2059m                         7. Dysphagia.. dMarland Kitchensphagia 3 thin liquids. Followup speech therapy ,appetitie improved on  megace will D/C and monitor also on Remeron 8. History of tobacco alcohol abuse. Urine drug screen positive cocaine and marijuana. Provide counseling as appropriate  9.  Rectal pain,secondary to impaction resolved    LOS (Days) 16 A FACE TO FACE EVALUATION WAS PERFORMED  Aamirah Salmi E 03/18/2013, 6:53 AM

## 2013-03-18 NOTE — Progress Notes (Signed)
Speech Language Pathology Daily Session Note  Patient Details  Name: Eric May MRN: [id number] Date of Birth: Jul 11, 1960  Today's Date: 03/18/2013 Time: 1300-1400 Time Calculation (min): 60 min  Short Term Goals: Week 3: SLP Short Term Goal 1 (Week 3): Patient will sustain attention to basic self-care task for 5-8 minutes with Mod clinician cues. SLP Short Term Goal 2 (Week 3): Patient will solve basic problems with Mod verbal cues. SLP Short Term Goal 3 (Week 3): Patient will request help with Max questions cues. SLP Short Term Goal 4 (Week 3): Patient will consume regular textures and thin liquids with Mod cues for portion control to prevent overt s/s of aspiration.  SLP Short Term Goal 5 (Week 3): Patient will maintain topic of conversation for 3 turns with Mod assist clinician cues  Skilled Therapeutic Interventions: Skilled treatment session focused on addressing dysphagia and cognition goals.  Patient reported needing to use the bathroom with Min-Mod cues to sequence and problem solve self-care task.  SLP also facilitated session with partial set-up assist and intermittent verbal cues for pacing and portion control and management of left pocketing while consuming Regular textures and thin liquids via cup.  Patient with improved awareness, self-monitoring and control of portions and pacing with no overt s/s of aspiration.  Recommend upgrade to regular texture with intermittent supervision.       FIM:  Comprehension Comprehension Mode: Auditory Comprehension: 4-Understands basic 75 - 89% of the time/requires cueing 10 - 24% of the time Expression Expression Mode: Verbal Expression: 4-Expresses basic 75 - 89% of the time/requires cueing 10 - 24% of the time. Needs helper to occlude trach/needs to repeat words. Social Interaction Social Interaction: 4-Interacts appropriately 75 - 89% of the time - Needs redirection for appropriate language or to initiate interaction. Problem  Solving Problem Solving: 3-Solves basic 50 - 74% of the time/requires cueing 25 - 49% of the time Memory Memory: 3-Recognizes or recalls 50 - 74% of the time/requires cueing 25 - 49% of the time FIM - Eating Eating Activity: 5: Needs verbal cues/supervision;5: Set-up assist for open containers  Pain Pain Assessment Pain Assessment: No/denies pain  Therapy/Group: Individual Therapy  Charlane FerrettiMelissa Keean Wilmeth, M.A., CCC-SLP [phone]  Eric May 03/18/2013, 4:05 PM

## 2013-03-18 NOTE — Progress Notes (Signed)
Occupational Therapy Weekly Progress Note  Patient Details  Name: Anthone Prieur MRN: [id number] Date of Birth: 1960/08/29  Today's Date: 03/18/2013  Patient has met 3 of 5 short term goals.  Pt is making slow but steady progress towards goals with improvements in pain and participation in therapy now that bowel issues have resolved.  Pt is currently min assist basic transfers, mod assist toilet and shower transfers, mod assist UB dressing, and total assist LB dressing.  Pt is developing awareness of deficits and intermittent increased recall of education.  Patient continues to demonstrate the following deficits: Lt hemiparesis with absent sensation and proprioception, Lt inattention, impaired Lt vision, impaired arousal and cognition with impaired sustained attention, impaired awareness, impaired memory, problem solving and sequencing with very poor frustration tolerance and perseveration, impaired balance, activity tolerance and therefore will continue to benefit from skilled OT intervention to enhance overall performance with BADL and Reduce care partner burden.  Patient progressing toward long term goals..  Continue plan of care.  OT Short Term Goals Week 2:  OT Short Term Goal 1 (Week 2): Pt will complete UB dressing with min assist OT Short Term Goal 1 - Progress (Week 2): Progressing toward goal OT Short Term Goal 2 (Week 2): Pt will complete LB dressing with max assist OT Short Term Goal 2 - Progress (Week 2): Progressing toward goal OT Short Term Goal 3 (Week 2): Pt will complete bathing with min assist and min verbal cues for initiation and sequencing. OT Short Term Goal 3 - Progress (Week 2): Met OT Short Term Goal 4 (Week 2): Pt will complete toilet transfer with mod assist 3 consecutive sessions. OT Short Term Goal 4 - Progress (Week 2): Met OT Short Term Goal 5 (Week 2): Pt will complete shower transfer with mod assist. OT Short Term Goal 5 - Progress (Week 2): Met Week 3:  OT  Short Term Goal 1 (Week 3): Pt will complete UB dressing with min assist OT Short Term Goal 2 (Week 3): Pt will complete LB dressing with max assist OT Short Term Goal 3 (Week 3): Pt will complete toilet tranfer with min assist 3 consecutive sessions OT Short Term Goal 4 (Week 3): Pt will complete toileting (hygiene and clothing management) with mod assist OT Short Term Goal 5 (Week 3): Pt will use LUE as gross assist with self-care tasks  Therapy Documentation Precautions:  Precautions Precautions: Fall Restrictions Weight Bearing Restrictions: No   Aliani Caccavale, [facility] 03/18/2013, 12:07 PM

## 2013-03-19 DIAGNOSIS — R209 Unspecified disturbances of skin sensation: Secondary | ICD-10-CM

## 2013-03-19 DIAGNOSIS — I619 Nontraumatic intracerebral hemorrhage, unspecified: Secondary | ICD-10-CM

## 2013-03-19 DIAGNOSIS — I69998 Other sequelae following unspecified cerebrovascular disease: Secondary | ICD-10-CM

## 2013-03-19 DIAGNOSIS — G8110 Spastic hemiplegia affecting unspecified side: Secondary | ICD-10-CM

## 2013-03-19 NOTE — Progress Notes (Signed)
53 y.o. right-handed male with history of hypertension, tobacco and alcohol abuse. Admitted 02/23/2013 when patient developed left-sided numbness and weakness while at work. Patient reports falling at that time due to weakness on the left. Blood pressure 194/117. CT of the head showed acute parenchymal hemorrhage over the right temporal parietal region measuring 4.9 x 3.2 cm with local mass effect and midline shift of 4 mm. Echocardiogram with ejection fraction 65% no valvular abnormalities. Carotid Dopplers with no ICA stenosis. MRI of the brain 02/24/2013 again showed intraparenchymal hematoma 5.2 x 3.7 x 3.3 cm with surrounding vasogenic edema. Neurosurgery Dr. Kathyrn Sheriff consult advise conservative management with repeat serial cranial CT scans. Urine drug screen was positive for cocaine as well as marijuana. Maintained on Cardene drip for blood pressure control.  Diet advance to dysphagia 3 thin liquids 03/01/2013. Keppra for seizure prophylaxis  Subjective/Complaints: No new complaints today Review of Systems - unable to obtain secondary to reduced level of alertness Objective: Vital Signs: Blood pressure 126/83, pulse 64, temperature 98.2 F (36.8 C), temperature source Oral, resp. rate 18, height 6' (1.829 m), weight 59.8 kg (131 lb 13.4 oz), SpO2 100.00%. No results found. Results for orders placed during the hospital encounter of 03/02/13 (from the past 72 hour(s))  BASIC METABOLIC PANEL     Status: None   Collection Time    03/18/13  6:00 AM      Result Value Range   Sodium 140  137 - 147 mEq/L   Potassium 4.1  3.7 - 5.3 mEq/L   Chloride 108  96 - 112 mEq/L   CO2 20  19 - 32 mEq/L   Glucose, Bld 82  70 - 99 mg/dL   BUN 10  6 - 23 mg/dL   Creatinine, Ser 0.68  0.50 - 1.35 mg/dL   Calcium 9.3  8.4 - 10.5 mg/dL   GFR calc non Af Amer >90  >90 mL/min   GFR calc Af Amer >90  >90 mL/min   Comment: (NOTE)     The eGFR has been calculated using the CKD EPI equation.     This calculation  has not been validated in all clinical situations.     eGFR's persistently <90 mL/min signify possible Chronic Kidney     Disease.      Constitutional: He appears alert, no distress. Resting in bed quietly until I entered room HENT: poor dentition,  Head: Normocephalic. atraumatic Eyes:  Pupils EOMI, without nystagmus  Neck: Normal range of motion. Neck supple. No thyromegaly present.  Cardiovascular: Normal rate and regular rhythm. No murmurs  Respiratory: Effort normal and breath sounds normal. No respiratory distress. No wheezes, rales, rhonchi  GI: Soft. Bowel sounds are normal. He exhibits no distension.  Neurological:  . Oriented to name and hosp. He did follow some simple commands.Loses attention quickly . 1/5 left shoulder ,grip was 2-/5. LLE was 2/5 with HF and KE and trace at the ankle.  Speech moderately dysarthric.  RUE 4/5 RLE 5/5 Skin: Skin is warm and dry.  Psychiatric:  Flat,  MSK:  - SLR  Rectal- no external hemorrhoids, good sphincter tone, didn't tolerate rectal exam beyond anal verge, no masses palpated  Assessment/Plan: 1. 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 Physiatrist is providing close team supervision and 24 hour management of active medical problems listed below. Physiatrist and rehab team continue to assess  barriers to discharge/monitor patient progress toward functional and medical goals.  FIM: FIM - Bathing Bathing Steps Patient Completed: Chest;Left Arm;Abdomen;Front perineal area;Right upper leg;Left upper leg;Buttocks;Right lower leg (including foot);Left lower leg (including foot) Bathing: 4: Min-Patient completes 8-9 50f10 parts or 75+ percent  FIM - Upper Body Dressing/Undressing Upper body dressing/undressing steps patient completed: Thread/unthread right sleeve of pullover  shirt/dresss;Put head through opening of pull over shirt/dress Upper body dressing/undressing: 3: Mod-Patient completed 50-74% of tasks FIM - Lower Body Dressing/Undressing Lower body dressing/undressing steps patient completed: Thread/unthread right pants leg Lower body dressing/undressing: 1: Total-Patient completed less than 25% of tasks  FIM - Toileting Toileting steps completed by patient: Performs perineal hygiene Toileting Assistive Devices: Grab bar or rail for support Toileting: 2: Max-Patient completed 1 of 3 steps  FIM - TRadio producerDevices: GProduct managerTransfers: 3-From toilet/BSC: Mod A (lift or lower assist)  FIM - BControl and instrumentation engineerDevices: Arm rests Bed/Chair Transfer: 4: Chair or W/C > Bed: Min A (steadying Pt. > 75%);4: Bed > Chair or W/C: Min A (steadying Pt. > 75%)  FIM - Locomotion: Wheelchair Distance: 150 Locomotion: Wheelchair: 0: Activity did not occur FIM - Locomotion: Ambulation Locomotion: Ambulation Assistive Devices: WAdministratorAmbulation/Gait Assistance: 1: +2 Total assist Locomotion: Ambulation: 0: Activity did not occur  Comprehension Comprehension Mode: Auditory Comprehension: 4-Understands basic 75 - 89% of the time/requires cueing 10 - 24% of the time  Expression Expression Mode: Verbal Expression: 4-Expresses basic 75 - 89% of the time/requires cueing 10 - 24% of the time. Needs helper to occlude trach/needs to repeat words.  Social Interaction Social Interaction: 4-Interacts appropriately 75 - 89% of the time - Needs redirection for appropriate language or to initiate interaction.  Problem Solving Problem Solving: 3-Solves basic 50 - 74% of the time/requires cueing 25 - 49% of the time  Memory Memory: 3-Recognizes or recalls 50 - 74% of the time/requires cueing 25 - 49% of the time  Medical Problem List and Plan:  1. 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 DVT Prophylaxis/Anticoagulation: SCDs. Monitor for any signs of DVT  3. Pain Management: Tylenol as needed. 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 Neuropsych: This patient is not capable of making decisions on his own behalf.  5. Seizure prophylaxis. Keppra 500 mg twice a day. Monitor for any seizure activity  6. Hypertension. Norvasc 10 mg daily,D/C lisinopril 2.5, propranolol 127mTID will increase to 209m                         7. Dysphagia.. dMarland Kitchensphagia 3 thin liquids. Followup speech therapy ,appetitie improved on  megace will D/C and monitor also on Remeron 8. History of tobacco alcohol abuse. Urine drug screen positive cocaine and marijuana.  9.  Rectal pain,secondary to impaction resolved       LOS (Days) 17 A FACE TO FACE EVALUATION WAS PERFORMED  SWARTZ,ZACHARY T 03/19/2013, 9:05 AM

## 2013-03-20 ENCOUNTER — Inpatient Hospital Stay (HOSPITAL_COMMUNITY): Payer: Medicaid Other | Admitting: Physical Therapy

## 2013-03-20 NOTE — Progress Notes (Signed)
Physical Therapy Session Note  Patient Details  Name: Eric May MRN: [id number] Date of Birth: 06-08-1960  Today's Date: 03/20/2013 Time: [id number] Time Calculation (min): 30 min  Short Term Goals: Week 1:     Skilled Therapeutic Interventions/Progress Updates:  Pt was seen bedside in the pm. Pt agreed to participate with therapy. Pt transferred chair to w/c after donning shoes with min to mod A. Pt propelled w/c to gym about 150 feet with R UE and LE, S and verbal cues. Pt sit to stand transfers with min A with rolling walker and LBQC, while standing worked on balance, weight shifting and unilateral stance. Pt returned to room. Pt transferred w/c to chair with min to mod A and verbal cues.   Therapy Documentation Precautions:  Precautions Precautions: Fall Restrictions Weight Bearing Restrictions: No General:  Pain: No c/o pain.   See FIM for current functional status  Therapy/Group: Individual Therapy  Rayford HalstedMitchell, Libbie Bartley G 03/20/2013, 3:43 PM

## 2013-03-20 NOTE — Progress Notes (Signed)
53 y.o. right-handed male with history of hypertension, tobacco and alcohol abuse. Admitted 02/23/2013 when patient developed left-sided numbness and weakness while at work. Patient reports falling at that time due to weakness on the left. Blood pressure 194/117. CT of the head showed acute parenchymal hemorrhage over the right temporal parietal region measuring 4.9 x 3.2 cm with local mass effect and midline shift of 4 mm. Echocardiogram with ejection fraction 65% no valvular abnormalities. Carotid Dopplers with no ICA stenosis. MRI of the brain 02/24/2013 again showed intraparenchymal hematoma 5.2 x 3.7 x 3.3 cm with surrounding vasogenic edema. Neurosurgery Dr. Kathyrn Sheriff consult advise conservative management with repeat serial cranial CT scans. Urine drug screen was positive for cocaine as well as marijuana. Maintained on Cardene drip for blood pressure control.  Diet advance to dysphagia 3 thin liquids 03/01/2013. Keppra for seizure prophylaxis  Subjective/Complaints: Resting comfortably.  Review of Systems - unable to obtain secondary to reduced level of alertness Objective: Vital Signs: Blood pressure 113/74, pulse 72, temperature 98.1 F (36.7 C), temperature source Oral, resp. rate 18, height 6' (1.829 m), weight 59.8 kg (131 lb 13.4 oz), SpO2 100.00%. No results found. Results for orders placed during the hospital encounter of 03/02/13 (from the past 72 hour(s))  BASIC METABOLIC PANEL     Status: None   Collection Time    03/18/13  6:00 AM      Result Value Range   Sodium 140  137 - 147 mEq/L   Potassium 4.1  3.7 - 5.3 mEq/L   Chloride 108  96 - 112 mEq/L   CO2 20  19 - 32 mEq/L   Glucose, Bld 82  70 - 99 mg/dL   BUN 10  6 - 23 mg/dL   Creatinine, Ser 0.68  0.50 - 1.35 mg/dL   Calcium 9.3  8.4 - 10.5 mg/dL   GFR calc non Af Amer >90  >90 mL/min   GFR calc Af Amer >90  >90 mL/min   Comment: (NOTE)     The eGFR has been calculated using the CKD EPI equation.     This calculation  has not been validated in all clinical situations.     eGFR's persistently <90 mL/min signify possible Chronic Kidney     Disease.      Constitutional: He appears alert, no distress. Resting in bed quietly until I entered room HENT: poor dentition,  Head: Normocephalic. atraumatic Eyes:  Pupils EOMI, without nystagmus  Neck: Normal range of motion. Neck supple. No thyromegaly present.  Cardiovascular: Normal rate and regular rhythm. No murmurs  Respiratory: Effort normal and breath sounds normal. No respiratory distress. No wheezes, rales, rhonchi  GI: Soft. Bowel sounds are normal. He exhibits no distension.  Neurological:  . Oriented to name and hosp. He did follow some simple commands.Loses attention quickly . 1/5 left shoulder ,grip was 2-/5. LLE was 2/5 with HF and KE and trace at the ankle.  Speech moderately dysarthric.  RUE 4/5 RLE 5/5 Skin: Skin is warm and dry.  Psychiatric:  Flat,  MSK:  - SLR  Rectal- no external hemorrhoids, good sphincter tone, didn'May tolerate rectal exam beyond anal verge, no masses palpated  Assessment/Plan: 1. 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 Physiatrist is providing close team supervision and 24 hour management of active medical problems listed below. Physiatrist and rehab team continue to assess barriers  to discharge/monitor patient progress toward functional and medical goals.  FIM: FIM - Bathing Bathing Steps Patient Completed: Chest;Left Arm;Abdomen;Front perineal area;Right upper leg;Left upper leg Bathing: 3: Mod-Patient completes 5-7 31f10 parts or 50-74%  FIM - Upper Body Dressing/Undressing Upper body dressing/undressing steps patient completed: Thread/unthread right sleeve of pullover shirt/dresss;Put head through opening of pull over shirt/dress Upper body  dressing/undressing: 3: Mod-Patient completed 50-74% of tasks FIM - Lower Body Dressing/Undressing Lower body dressing/undressing steps patient completed: Thread/unthread right pants leg Lower body dressing/undressing: 1: Total-Patient completed less than 25% of tasks  FIM - Toileting Toileting steps completed by patient: Performs perineal hygiene Toileting Assistive Devices: Grab bar or rail for support Toileting: 2: Max-Patient completed 1 of 3 steps  FIM - TRadio producerDevices: Grab bars Toilet Transfers: 3-From toilet/BSC: Mod A (lift or lower assist)  FIM - BControl and instrumentation engineerDevices: Arm rests;Walker Bed/Chair Transfer: 4: Bed > Chair or W/C: Min A (steadying Pt. > 75%)  FIM - Locomotion: Wheelchair Distance: 150 Locomotion: Wheelchair: 0: Activity did not occur FIM - Locomotion: Ambulation Locomotion: Ambulation Assistive Devices: WAdministratorAmbulation/Gait Assistance: 1: +2 Total assist Locomotion: Ambulation: 0: Activity did not occur  Comprehension Comprehension Mode: Auditory Comprehension: 4-Understands basic 75 - 89% of the time/requires cueing 10 - 24% of the time  Expression Expression Mode: Verbal Expression: 4-Expresses basic 75 - 89% of the time/requires cueing 10 - 24% of the time. Needs helper to occlude trach/needs to repeat words.  Social Interaction Social Interaction: 4-Interacts appropriately 75 - 89% of the time - Needs redirection for appropriate language or to initiate interaction.  Problem Solving Problem Solving: 3-Solves basic 50 - 74% of the time/requires cueing 25 - 49% of the time  Memory Memory: 3-Recognizes or recalls 50 - 74% of the time/requires cueing 25 - 49% of the time  Medical Problem List and Plan:  1. 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 DVT Prophylaxis/Anticoagulation: SCDs. Monitor for any signs of DVT  3. Pain Management: Tylenol as needed. 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 Neuropsych: This patient is not capable of making decisions on his own behalf.  5. Seizure prophylaxis. Keppra 500 mg twice a day. Monitor for any seizure activity  6. Hypertension. Norvasc 10 mg daily,D/C lisinopril 2.5, propranolol 181mTID will increase to 203m                         7. Dysphagia.. dMarland Kitchensphagia 3 thin liquids. Followup speech therapy ,appetitie improved on  megace will D/C and monitor also on Remeron 8. History of tobacco alcohol abuse. Urine drug screen positive cocaine and marijuana.  9.  Rectal pain,secondary to impaction resolved       LOS (Days) 18 A FACE TO FACE EVALUATION WAS PERFORMED  Eric May 03/20/2013, 8:24 AM

## 2013-03-21 ENCOUNTER — Inpatient Hospital Stay (HOSPITAL_COMMUNITY): Payer: Medicaid Other | Admitting: Occupational Therapy

## 2013-03-21 ENCOUNTER — Inpatient Hospital Stay (HOSPITAL_COMMUNITY): Payer: Medicaid Other | Admitting: Physical Therapy

## 2013-03-21 ENCOUNTER — Inpatient Hospital Stay (HOSPITAL_COMMUNITY): Payer: Medicaid Other | Admitting: Speech Pathology

## 2013-03-21 DIAGNOSIS — I619 Nontraumatic intracerebral hemorrhage, unspecified: Secondary | ICD-10-CM

## 2013-03-21 DIAGNOSIS — G8110 Spastic hemiplegia affecting unspecified side: Secondary | ICD-10-CM

## 2013-03-21 DIAGNOSIS — R209 Unspecified disturbances of skin sensation: Secondary | ICD-10-CM

## 2013-03-21 DIAGNOSIS — I69998 Other sequelae following unspecified cerebrovascular disease: Secondary | ICD-10-CM

## 2013-03-21 NOTE — Progress Notes (Signed)
53 y.o. right-handed male with history of hypertension, tobacco and alcohol abuse. Admitted 02/23/2013 when patient developed left-sided numbness and weakness while at work. Patient reports falling at that time due to weakness on the left. Blood pressure 194/117. CT of the head showed acute parenchymal hemorrhage over the right temporal parietal region measuring 4.9 x 3.2 cm with local mass effect and midline shift of 4 mm. Echocardiogram with ejection fraction 65% no valvular abnormalities. Carotid Dopplers with no ICA stenosis. MRI of the brain 02/24/2013 again showed intraparenchymal hematoma 5.2 x 3.7 x 3.3 cm with surrounding vasogenic edema. Neurosurgery Dr. Conchita ParisNundkumar consult advise conservative management with repeat serial cranial CT scans. Urine drug screen was positive for cocaine as well as marijuana. Maintained on Cardene drip for blood pressure control.  Diet advance to dysphagia 3 thin liquids 03/01/2013. Keppra for seizure prophylaxis  Subjective/Complaints: Mild HA, using urinal to void, asking to be toileted for BM  Review of Systems - unable to obtain secondary to reduced level of alertness Objective: Vital Signs: Blood pressure 126/83, pulse 73, temperature 98.2 F (36.8 C), temperature source Oral, resp. rate 19, height 6' (1.829 m), weight 59.8 kg (131 lb 13.4 oz), SpO2 100.00%. No results found. No results found for this or any previous visit (from the past 72 hour(s)).    Constitutional: He appears alert, no distress. Resting in bed quietly until I entered room HENT: poor dentition,  Head: Normocephalic. atraumatic Eyes:  Pupils EOMI, without nystagmus  Neck: Normal range of motion. Neck supple. No thyromegaly present.  Cardiovascular: Normal rate and regular rhythm. No murmurs  Respiratory: Effort normal and breath sounds normal. No respiratory distress. No wheezes, rales, rhonchi  GI: Soft. Bowel sounds are normal. He exhibits no distension.  Neurological:  . Oriented  to name and hosp. He did follow some simple commands.Loses attention quickly . 1/5 left shoulder ,grip was 2-/5. LLE was 2/5 with HF and KE and trace at the ankle.  Speech moderately dysarthric.  RUE 4/5 RLE 5/5 Skin: Skin is warm and dry.  Psychiatric:  Flat,  MSK:  - SLR  Rectal- no external hemorrhoids, good sphincter tone, didn't tolerate rectal exam beyond anal verge, no masses palpated  Assessment/Plan: 1. 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 Physiatrist is providing close team supervision and 24 hour management of active medical problems listed below. Physiatrist and rehab team continue to assess barriers to discharge/monitor patient progress toward functional and medical goals.  FIM: FIM - Bathing Bathing Steps Patient Completed: Chest;Left Arm;Abdomen;Front perineal area;Right upper leg;Left upper leg Bathing: 3: Mod-Patient completes 5-7 1674f 10 parts or 50-74%  FIM - Upper Body Dressing/Undressing Upper body dressing/undressing steps patient completed: Thread/unthread left sleeve of pullover shirt/dress;Put head through opening of pull over shirt/dress;Thread/unthread right sleeve of pullover shirt/dresss;Pull shirt over trunk Upper body dressing/undressing: 6: More than reasonable amount of time FIM - Lower Body Dressing/Undressing Lower body dressing/undressing steps patient completed: Thread/unthread right pants leg Lower body dressing/undressing: 1: Total-Patient completed less than 25% of tasks  FIM - Toileting Toileting steps completed by patient: Performs perineal hygiene Toileting Assistive Devices: Grab bar or rail for support Toileting: 2: Max-Patient completed 1 of 3 steps  FIM - Diplomatic Services operational officerToilet Transfers Toilet Transfers Assistive Devices: Grab bars Toilet Transfers: 3-From toilet/BSC: Mod A (lift or  lower assist)  FIM - BankerBed/Chair Transfer Bed/Chair Transfer Assistive Devices: Arm  rests;Walker Bed/Chair Transfer: 4: Bed > Chair or W/C: Min A (steadying Pt. > 75%)  FIM - Locomotion: Wheelchair Distance: 150 Locomotion: Wheelchair: 5: Travels 150 ft or more: maneuvers on rugs and over door sills with supervision, cueing or coaxing FIM - Locomotion: Ambulation Locomotion: Ambulation Assistive Devices: Designer, industrial/product Ambulation/Gait Assistance: 1: +2 Total assist Locomotion: Ambulation: 0: Activity did not occur  Comprehension Comprehension Mode: Auditory Comprehension: 4-Understands basic 75 - 89% of the time/requires cueing 10 - 24% of the time  Expression Expression Mode: Verbal Expression: 4-Expresses basic 75 - 89% of the time/requires cueing 10 - 24% of the time. Needs helper to occlude trach/needs to repeat words.  Social Interaction Social Interaction: 4-Interacts appropriately 75 - 89% of the time - Needs redirection for appropriate language or to initiate interaction.  Problem Solving Problem Solving: 3-Solves basic 50 - 74% of the time/requires cueing 25 - 49% of the time  Memory Memory: 3-Recognizes or recalls 50 - 74% of the time/requires cueing 25 - 49% of the time  Medical Problem List and Plan:  1. 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 DVT Prophylaxis/Anticoagulation: SCDs. Monitor for any signs of DVT  3. Pain Management: Tylenol as needed. 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 Neuropsych: This patient is not capable of making decisions on his own behalf.  5. Seizure prophylaxis. Keppra 500 mg twice a day. Monitor for any seizure activity  6. Hypertension. Norvasc 10 mg daily,D/C lisinopril 2.5, propranolol 10mg  TID will increase to 20mg m                          7. Dysphagia.[name] dysphagia 3 thin  liquids. Followup speech therapy ,appetitie improved on  megace will D/C and monitor also on Remeron 8. History of tobacco alcohol abuse. Urine drug screen positive cocaine and marijuana.  9.  Rectal pain,secondary to impaction resolved, continence improving       LOS (Days) 19 A FACE TO FACE EVALUATION WAS PERFORMED  KIRSTEINS,ANDREW E 03/21/2013, 6:58 AM

## 2013-03-21 NOTE — Progress Notes (Signed)
Recreational Therapy Session Note  Patient Details  Name: Eric May MRN: [id number] Date of Birth: 1960/11/26 Today's Date: 03/21/2013  Pain: c/o HA unrated, RN aware, med given Skilled Therapeutic Interventions/Progress Updates: Pt in bed upon arrival, PT attempting treatment session.  Pt argumentative & angry stating he was "tired of this, I'm not gonna do it."  When asked about toileting, pt stated he needed to have BM.  Pt performed bed mobility with close supervision & squat pivot transfers with min assist, toileting with min assist.  Pt continuously stating his frustration/yelling about being in hospital and people waking him up & telling him what to do.  Used de-escalation techniques including calming voice & acknowledgement of frustration afterwhich pt was able to be redirected for short periods of time for safe mobility.  Pt refused further therapy and left seated in recliner with quick release belt & RN at bedside.  Bettylou Frew 03/21/2013, 11:48 AM

## 2013-03-21 NOTE — Progress Notes (Signed)
Occupational Therapy Session Note  Patient Details  Name: Eric May MRN: [id number] Date of Birth: [birth year]  Today's Date: 03/21/2013 Time: [id number] Time Calculation (min): 10 min  Short Term Goals: Week 3:  OT Short Term Goal 1 (Week 3): Pt will complete UB dressing with min assist OT Short Term Goal 2 (Week 3): Pt will complete LB dressing with max assist OT Short Term Goal 3 (Week 3): Pt will complete toilet tranfer with min assist 3 consecutive sessions OT Short Term Goal 4 (Week 3): Pt will complete toileting (hygiene and clothing management) with mod assist OT Short Term Goal 5 (Week 3): Pt will use LUE as gross assist with self-care tasks  Skilled Therapeutic Interventions/Progress Updates:    Attempted to see pt for ADL retraining, however pt refusing to participate in therapy at this time.  Pt up in recliner reporting need to get back in bed due to fatigue. Attempted to redirect and encourage pt to participate, to which pt reports "I just need to sleep right now, maybe in a few hours".  Reports did not sleep well over night.  Performed stand pivot transfer to Rt with min assist to bed with cues for LLE placement.    Therapy Documentation Precautions:  Precautions Precautions: Fall Restrictions Weight Bearing Restrictions: No General: General Amount of Missed OT Time (min): 35 Minutes Vital Signs: Therapy Vitals Pulse Rate: 77 BP: 127/82 mmHg Pain:  Pt with no c/o pain  See FIM for current functional status  Therapy/Group: Individual Therapy  [name], Nai Dasch 03/21/2013, 9:38 AM

## 2013-03-21 NOTE — Progress Notes (Signed)
Speech Language Pathology Daily Session Note  Patient Details  Name: Gerre Pebblesnthony Honeycutt MRN: [id number] Date of Birth: 1961-01-05  Today's Date: 03/21/2013 Time: 1302-1358 Time Calculation (min): 56 min  Short Term Goals: Week 3: SLP Short Term Goal 1 (Week 3): Patient will sustain attention to basic self-care task for 5-8 minutes with Mod clinician cues. SLP Short Term Goal 2 (Week 3): Patient will solve basic problems with Mod verbal cues. SLP Short Term Goal 3 (Week 3): Patient will request help with Max questions cues. SLP Short Term Goal 4 (Week 3): Patient will consume regular textures and thin liquids with Mod cues for portion control to prevent overt s/s of aspiration.  SLP Short Term Goal 5 (Week 3): Patient will maintain topic of conversation for 3 turns with Mod assist clinician cues  Skilled Therapeutic Interventions: Skilled treatment session focused on addressing cognition goals.  SLP facilitated session with new learning task that required patient to recall rules of game and alternate attention between 3 different variables per turn.  Patient required Mod cues to utilize external aid to assist with recall of rules and Min cues to switch between variables.  Patient became frustrated with difficulty seeing left of environment; however, patient appropriately compensated for this throughout game.  Patient reported needing to use the bathroom with Min-Mod cues to sequence and problem solve self-care task and then 2+ for safety with transfer.  Continue with current plan of care.    FIM:  Comprehension Comprehension Mode: Auditory Comprehension: 4-Understands basic 75 - 89% of the time/requires cueing 10 - 24% of the time Expression Expression Mode: Verbal Expression: 4-Expresses basic 75 - 89% of the time/requires cueing 10 - 24% of the time. Needs helper to occlude trach/needs to repeat words. Social Interaction Social Interaction: 4-Interacts appropriately 75 - 89% of the time -  Needs redirection for appropriate language or to initiate interaction. Problem Solving Problem Solving: 3-Solves basic 50 - 74% of the time/requires cueing 25 - 49% of the time Memory Memory: 3-Recognizes or recalls 50 - 74% of the time/requires cueing 25 - 49% of the time FIM - Eating Eating Activity: 5: Needs verbal cues/supervision;5: Set-up assist for open containers  Pain Pain Assessment Pain Assessment: No/denies pain  Therapy/Group: Individual Therapy  Charlane FerrettiMelissa Jashanti Clinkscale, M.A., CCC-SLP [phone]  Wealthy Danielski 03/21/2013, 4:12 PM

## 2013-03-21 NOTE — Progress Notes (Signed)
Physical Therapy Note  Patient Details  Name: Eric May MRN: [id number] Date of Birth: Jul 09, 1960 Today's Date: 03/21/2013  Time: 1030-1104 34 minutes co-tx with TR  1:1 Pt c/o headache, RN made aware and meds rec'd.  Upon PT arrival pt frustrated, stating he needs to use the bathroom.  PT and TR assisted pt bed <> w/c <> toilet with min A for safety with transfers.  Standing balance with handrail with close supervision for hygiene and clothing management for toileting.  Pt able to stand with min A for lower body dressing.  Pt refused final 10 minutes of session due to c/o pain and frustration.  Pt in recliner with quick release belt, needs at hand.   Karelyn Brisby 03/21/2013, 11:05 AM

## 2013-03-22 ENCOUNTER — Inpatient Hospital Stay (HOSPITAL_COMMUNITY): Payer: Medicaid Other | Admitting: Speech Pathology

## 2013-03-22 ENCOUNTER — Inpatient Hospital Stay (HOSPITAL_COMMUNITY): Payer: Medicaid Other | Admitting: Physical Therapy

## 2013-03-22 ENCOUNTER — Inpatient Hospital Stay (HOSPITAL_COMMUNITY): Payer: Medicaid Other | Admitting: Occupational Therapy

## 2013-03-22 NOTE — Progress Notes (Signed)
Physical Therapy Session Note  Patient Details  Name: Eric May MRN: [id number] Date of Birth: [birth year]  Today's Date: 03/22/2013 Time: [id number] Time Calculation (min): 40 min  Skilled Therapeutic Interventions/Progress Updates:   Pt in recliner; just finished using urinal.  Pt also requesting to go to toilet to have BM.  Performed transfers recliner <> w/c <> toilet squat pivot and stand pivot with grab bar in bathroom with min A.  Pt performed perineal hygiene on toilet but required assistance for clothing management.  Transferred w/c <> Nustep with min A.  Performed bilat UE and LE endurance, strengthening and coordination on Nustep at level 5 resistance x 10 minutes with bilat UE and LE + 2 minutes with LE only for extensor strengthening.  Returned to room and to recliner with supervision.  Pt educated on saccade exercise with two letters spaced apart width of bedside table to focus on visual scanning and attention to L visual field.  Pt to perform saccade exercise in room in between therapies.  Pt left in recliner with quick release belt in place.   Therapy Documentation Precautions:  Precautions Precautions: Fall Restrictions Weight Bearing Restrictions: No Vital Signs: Therapy Vitals Pulse Rate: 71 BP: 121/77 mmHg Pain:  No c/o pain  See FIM for current functional status  Therapy/Group: Individual Therapy  Edman CircleHall, Lawerence Dery [facility]tr[facility]auc[facility] 03/22/2013, 11:04 AM

## 2013-03-22 NOTE — Progress Notes (Signed)
53 y.o. right-handed male with history of hypertension, tobacco and alcohol abuse. Admitted 02/23/2013 when patient developed left-sided numbness and weakness while at work. Patient reports falling at that time due to weakness on the left. Blood pressure 194/117. CT of the head showed acute parenchymal hemorrhage over the right temporal parietal region measuring 4.9 x 3.2 cm with local mass effect and midline shift of 4 mm. Echocardiogram with ejection fraction 65% no valvular abnormalities. Carotid Dopplers with no ICA stenosis. MRI of the brain 02/24/2013 again showed intraparenchymal hematoma 5.2 x 3.7 x 3.3 cm with surrounding vasogenic edema. Neurosurgery Dr. Conchita ParisNundkumar consult advise conservative management with repeat serial cranial CT scans. Urine drug screen was positive for cocaine as well as marijuana. Maintained on Cardene drip for blood pressure control.  Diet advance to dysphagia 3 thin liquids 03/01/2013. Keppra for seizure prophylaxis  Subjective/Complaints: No HA, had "gas pain" in chest improved after pill nurse gave him, I spoke to RN who stated the pill was Xanax Asking about "release date"  Review of Systems -No chest pain or SOB, no abd or rectal pain Objective: Vital Signs: Blood pressure 112/71, pulse 65, temperature 98.5 F (36.9 C), temperature source Oral, resp. rate 18, height 6' (1.829 m), weight 59.8 kg (131 lb 13.4 oz), SpO2 100.00%. No results found. No results found for this or any previous visit (from the past 72 hour(s)).    Constitutional: He appears alert, no distress. Resting in bed quietly until I entered room HENT: poor dentition,  Head: Normocephalic. atraumatic Eyes:  Pupils EOMI, without nystagmus  Neck: Normal range of motion. Neck supple. No thyromegaly present.  Cardiovascular: Normal rate and regular rhythm. No murmurs  Respiratory: Effort normal and breath sounds normal. No respiratory distress. No wheezes, rales, rhonchi  GI: Soft. Bowel sounds  are normal. He exhibits no distension.  Neurological:  . Oriented to name and hosp. He did follow some simple commands.Loses attention quickly . 1/5 left shoulder ,grip was 2-/5. LLE was 2/5 with HF and KE and trace at the ankle.  Speech moderately dysarthric.  RUE 4/5 RLE 5/5 Skin: Skin is warm and dry.  Psychiatric:  Flat,  MSK:  - SLR    Assessment/Plan: 1. 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 Physiatrist is providing close team supervision and 24 hour management of active medical problems listed below. Physiatrist and rehab team continue to assess barriers to discharge/monitor patient progress toward functional and medical goals.  FIM: FIM - Bathing Bathing Steps Patient Completed: Chest;Left Arm;Abdomen;Front perineal area;Right upper leg;Left upper leg Bathing: 3: Mod-Patient completes 5-7 5671f 10 parts or 50-74%  FIM - Upper Body Dressing/Undressing Upper body dressing/undressing steps patient completed: Thread/unthread left sleeve of pullover shirt/dress;Put head through opening of pull over shirt/dress;Thread/unthread right sleeve of pullover shirt/dresss;Pull shirt over trunk Upper body dressing/undressing: 6: More than reasonable amount of time FIM - Lower Body Dressing/Undressing Lower body dressing/undressing steps patient completed: Thread/unthread right pants leg Lower body dressing/undressing: 1: Total-Patient completed less than 25% of tasks  FIM - Toileting Toileting steps completed by patient: Performs perineal hygiene Toileting Assistive Devices: Grab bar or rail for support Toileting: 2: Max-Patient completed 1 of 3 steps  FIM - Diplomatic Services operational officerToilet Transfers Toilet Transfers Assistive Devices: Grab bars Toilet Transfers: 3-From toilet/BSC: Mod A (lift or lower assist)  FIM - Event organiserBed/Chair Transfer Bed/Chair  Transfer Assistive Devices: Arm rests;Dan HumphreysWalker  Bed/Chair Transfer: 4: Bed > Chair or W/C: Min A (steadying Pt. > 75%)  FIM - Locomotion: Wheelchair Distance: 150 Locomotion: Wheelchair: 5: Travels 150 ft or more: maneuvers on rugs and over door sills with supervision, cueing or coaxing FIM - Locomotion: Ambulation Locomotion: Ambulation Assistive Devices: Designer, industrial/product Ambulation/Gait Assistance: 1: +2 Total assist Locomotion: Ambulation: 0: Activity did not occur  Comprehension Comprehension Mode: Auditory Comprehension: 4-Understands basic 75 - 89% of the time/requires cueing 10 - 24% of the time  Expression Expression Mode: Verbal Expression: 4-Expresses basic 75 - 89% of the time/requires cueing 10 - 24% of the time. Needs helper to occlude trach/needs to repeat words.  Social Interaction Social Interaction: 4-Interacts appropriately 75 - 89% of the time - Needs redirection for appropriate language or to initiate interaction.  Problem Solving Problem Solving: 3-Solves basic 50 - 74% of the time/requires cueing 25 - 49% of the time  Memory Memory: 3-Recognizes or recalls 50 - 74% of the time/requires cueing 25 - 49% of the time  Medical Problem List and Plan:  1. 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 DVT Prophylaxis/Anticoagulation: SCDs. Monitor for any signs of DVT  3. Pain Management: Tylenol as needed. 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 Neuropsych: This patient is not capable of making decisions on his own behalf.  5. Seizure prophylaxis. Keppra 500 mg twice a day. Monitor for any seizure activity  6. Hypertension. Norvasc 10 mg daily,D/C lisinopril 2.5, propranolol 10mg  TID will increase to 20mg m                          7. Dysphagia.[name] dysphagia 3 thin liquids. Followup speech therapy ,appetitie improved off   megace ,cont Remeron 8. History of tobacco alcohol abuse. Urine drug screen positive cocaine and marijuana.  9.  Rectal pain,secondary to impaction resolved, continence improving       LOS (Days) 20 A FACE TO FACE EVALUATION WAS PERFORMED  KIRSTEINS,ANDREW E 03/22/2013, 7:32 AM

## 2013-03-22 NOTE — Progress Notes (Signed)
Speech Language Pathology Daily Session Note  Patient Details  Name: Eric May MRN: [id number] Date of Birth: 1960/05/23  Today's Date: 03/22/2013 Time: [id number] Time Calculation (min): 57 min  Short Term Goals: Week 3: SLP Short Term Goal 1 (Week 3): Patient will sustain attention to basic self-care task for 5-8 minutes with Mod clinician cues. SLP Short Term Goal 2 (Week 3): Patient will solve basic problems with Mod verbal cues. SLP Short Term Goal 3 (Week 3): Patient will request help with Max questions cues. SLP Short Term Goal 4 (Week 3): Patient will consume regular textures and thin liquids with Mod cues for portion control to prevent overt s/s of aspiration.  SLP Short Term Goal 5 (Week 3): Patient will maintain topic of conversation for 3 turns with Mod assist clinician cues  Skilled Therapeutic Interventions: Skilled treatment session focused on addressing cognition goals.  SLP facilitated session with requests to recall and instruct SLP how to play game; patient required Min verbal cues to recall rules of game.  Patient required Mod faded to Min cues to alternate attention between 3 different variables.  Patient required Supervision cues to utilize external aid to assist with recall of rules during the task.  SLP also facilitated session with various home management tasks and which patient completed with Min cues to organize, sequence and attend to left during.  Continue with current plan of care.   FIM:  Comprehension Comprehension Mode: Auditory Comprehension: 5-Understands basic 90% of the time/requires cueing < 10% of the time Expression Expression Mode: Verbal Expression: 4-Expresses basic 75 - 89% of the time/requires cueing 10 - 24% of the time. Needs helper to occlude trach/needs to repeat words. Social Interaction Social Interaction: 4-Interacts appropriately 75 - 89% of the time - Needs redirection for appropriate language or to initiate interaction. Problem  Solving Problem Solving: 3-Solves basic 50 - 74% of the time/requires cueing 25 - 49% of the time Memory Memory: 4-Recognizes or recalls 75 - 89% of the time/requires cueing 10 - 24% of the time  Pain Pain Assessment Pain Assessment: No/denies pain   Therapy/Group: Individual Therapy  Charlane FerrettiMelissa Valene Villa, M.A., CCC-SLP [phone]  Francie Keeling 03/22/2013, 5:39 PM

## 2013-03-22 NOTE — Progress Notes (Signed)
Occupational Therapy Session Note  Patient Details  Name: Eric May MRN: [id number] Date of Birth: 15-Apr-1960  Today's Date: 03/22/2013 Time: [id number] Time Calculation (min): 35 min  Short Term Goals: Week 3:  OT Short Term Goal 1 (Week 3): Pt will complete UB dressing with min assist OT Short Term Goal 2 (Week 3): Pt will complete LB dressing with max assist OT Short Term Goal 3 (Week 3): Pt will complete toilet tranfer with min assist 3 consecutive sessions OT Short Term Goal 4 (Week 3): Pt will complete toileting (hygiene and clothing management) with mod assist OT Short Term Goal 5 (Week 3): Pt will use LUE as gross assist with self-care tasks  Skilled Therapeutic Interventions/Progress Updates:    Engaged in ADL retraining with focus on increased participation in self-care tasks and functional use of LUE.  Pt on phone upon arrival with acknowledgement of therapist and then returning to phone call.  Returned to attempt to engage pt again to find pt off phone.  Discussed purpose of therapy and goals of pt, with pt reporting desire to shower daily and to focus on LUE.  Performed stand pivot transfer bed > w/c > shower chair with min assist with pt with increased awareness of LLE positioning with transfers.  Pt completed bathing with spontaneous use of LUE to wash RUE, however dropping wash cloth during attempt.  Pt with carryover of hemi-dressing technique and demonstrated UB dressing with setup assist this session.  Pt continues to require total assist with LB dressing, however min assist sit > stand and assisted with pulling pants over hips this session.  Pt setup in recliner with RN entering to provide meds.  Therapy Documentation Precautions:  Precautions Precautions: Fall Restrictions Weight Bearing Restrictions: No General: General Amount of Missed OT Time (min): 10 Minutes Vital Signs: Therapy Vitals Temp: 98.5 F (36.9 C) Temp src: Oral Pulse Rate: 71 Resp: 18 BP:  121/77 mmHg Patient Position, if appropriate: Lying Oxygen Therapy SpO2: 100 % O2 Device: None (Room air) Pain:  Pt with no c/o pain this session.  See FIM for current functional status  Therapy/Group: Individual Therapy  [name], Maile Linford 03/22/2013, 9:17 AM

## 2013-03-23 ENCOUNTER — Inpatient Hospital Stay (HOSPITAL_COMMUNITY): Payer: Medicaid Other | Admitting: Occupational Therapy

## 2013-03-23 ENCOUNTER — Inpatient Hospital Stay (HOSPITAL_COMMUNITY): Payer: Medicaid Other | Admitting: Physical Therapy

## 2013-03-23 ENCOUNTER — Inpatient Hospital Stay (HOSPITAL_COMMUNITY): Payer: Medicaid Other | Admitting: Speech Pathology

## 2013-03-23 LAB — OCCULT BLOOD X 1 CARD TO LAB, STOOL: Fecal Occult Bld: NEGATIVE

## 2013-03-23 NOTE — Patient Care Conference (Signed)
Inpatient RehabilitationTeam Conference and Plan of Care Update Date: 03/23/2013   Time: 11:30 AM    Patient Name: Eric May      Medical Record Number: [id number]  Date of Birth: 1960-07-28 Sex: Male         Room/Bed: 4W11C/4W11C-01 Payor Info: Payor: MEDICAID PENDING / Plan: MEDICAID PENDING / Product Type: *No Product type* /    Admitting Diagnosis: ICH  Admit Date/Time:  03/02/2013  6:21 PM Admission Comments: No comment available   Primary Diagnosis:  <principal problem not specified> Principal Problem: <principal problem not specified>  Patient Active Problem List   Diagnosis Date Noted  . ICH (intracerebral hemorrhage) 03/02/2013  . Convulsions/seizures 02/24/2013  . Intracerebral hemorrhage 02/23/2013    Expected Discharge Date:    Team Members Present: Physician leading conference: Dr. Alysia Penna Social Worker Present: Ovidio Kin, LCSW;Jenny Prevatt, LCSW Nurse Present: Dorien Chihuahua, RN PT Present: Raylene Everts, PT;Emily Rinaldo Cloud, PT OT Present: Simonne Come, OT;Kris Gellert, Maryella Shivers, OT SLP Present: Gunnar Fusi, SLP PPS Coordinator present : Daiva Nakayama, RN, CRRN     Current Status/Progress Goal Weekly Team Focus  Medical   constipation improved, no leg pain  maintain stability  d/c planning   Bowel/Bladder   continent of bowel and bladder; spill urinal at times  continent of bowel and bladder; using urinal only at HS  condom cath during the night and offer assistance with use of urinal during the day or up to toilet   Swallow/Nutrition/ Hydration   regular and thin with intermittent supervision   least restrictive p.o. intake   goals met   ADL's   min assist bathing, supervision UB dressing, total assist LB dressing, min assist transfers.  Pt continues to have limited participation in treatment sessions.  min assist overall, mod assist LB dressing and toileting  participation in treatment session, LUE NM re-ed, self-care retraining    Mobility   Supervision transfers and w/c mobility, mod-max A gait and stairs with RUE support  Goals upgraded; supervision transfers and w/c mobility, mod A gait  Attention to L and visual scanning to L, gait, standing balance. LLE strengthening   Communication   Min assist   goals upgraded to supervision   increase awareness of perseveration, self-monitoring and correcting   Safety/Cognition/ Behavioral Observations  Mod assist   upgraded to min assist   increase left attention, awareness, recall and overall safety    Pain   pain managed with tylenol and ultram  pain managed with prn meds; able to participate in ADLS  assesseffectiveness and need for prn meds   Skin   n/a  n/a  n/a      *See Care Plan and progress notes for long and short-term goals.  Barriers to Discharge: needs 24/7 min/mod A    Possible Resolutions to Barriers:  SNF vs family    Discharge Planning/Teaching Needs:  [facility]-no offers as of yet      Team Discussion:  Making gains but not enough to go home. Participating more in therapies, becomes frustrated at times.  Revisions to Treatment Plan:  [facility]   Continued Need for Acute Rehabilitation Level of Care: The patient requires daily medical management by a physician with specialized training in physical medicine and rehabilitation for the following conditions: Daily direction of a multidisciplinary physical rehabilitation program to ensure safe treatment while eliciting the highest outcome that is of practical value to the patient.: Yes Daily medical management of patient stability for increased activity  during participation in an intensive rehabilitation regime.: Yes Daily analysis of laboratory values and/or radiology reports with any subsequent need for medication adjustment of medical intervention for : Neurological problems;Other  Elease Hashimoto 03/24/2013, 9:27 AM

## 2013-03-23 NOTE — Progress Notes (Signed)
Occupational Therapy Session Note  Patient Details  Name: Eric May MRN: [id number] Date of Birth: 1960-12-26  Today's Date: 03/23/2013 Time: [id number] Time Calculation (min): 45 min  Short Term Goals: Week 3:  OT Short Term Goal 1 (Week 3): Pt will complete UB dressing with min assist OT Short Term Goal 2 (Week 3): Pt will complete LB dressing with max assist OT Short Term Goal 3 (Week 3): Pt will complete toilet tranfer with min assist 3 consecutive sessions OT Short Term Goal 4 (Week 3): Pt will complete toileting (hygiene and clothing management) with mod assist OT Short Term Goal 5 (Week 3): Pt will use LUE as gross assist with self-care tasks  Skilled Therapeutic Interventions/Progress Updates:    Pt seen for ADL retraining with focus on increased participation in self-care tasks and functional use of LUE. Engaged in bathing in room shower at seated level with pt performing lateral leans to wash buttocks with close supervision.  Prior to bathing, pt reported needing to void, completed voiding in standing with min/steady assist to maintain standing balance. Dressing completed at sit > stand level with pt able to thread RLE in underwear and pants as well as don Rt shoe. Min assist sit > stand with pt assisting with pulling pants over hips in standing.  Forced use of LUE with applying lotion to Rt arm with pt verbalizing frustration with decreased control.  Toilet transfer to complete BM, with pt with no luck, but able to pull down pants and complete hygiene with steady assist.  Therapy Documentation Precautions:  Precautions Precautions: Fall Restrictions Weight Bearing Restrictions: No Pain: Pain Assessment Pain Assessment: No/denies pain  See FIM for current functional status  Therapy/Group: Individual Therapy  [name], Preethi Scantlebury 03/23/2013, 11:49 AM

## 2013-03-23 NOTE — Progress Notes (Signed)
53 y.o. right-handed male with history of hypertension, tobacco and alcohol abuse. Admitted 02/23/2013 when patient developed left-sided numbness and weakness while at work. Patient reports falling at that time due to weakness on the left. Blood pressure 194/117. CT of the head showed acute parenchymal hemorrhage over the right temporal parietal region measuring 4.9 x 3.2 cm with local mass effect and midline shift of 4 mm. Echocardiogram with ejection fraction 65% no valvular abnormalities. Carotid Dopplers with no ICA stenosis. MRI of the brain 02/24/2013 again showed intraparenchymal hematoma 5.2 x 3.7 x 3.3 cm with surrounding vasogenic edema. Neurosurgery Dr. Conchita ParisNundkumar consult advise conservative management with repeat serial cranial CT scans. Urine drug screen was positive for cocaine as well as marijuana. Maintained on Cardene drip for blood pressure control.  Diet advance to dysphagia 3 thin liquids 03/01/2013. Keppra for seizure prophylaxis  Subjective/Complaints: Stomach ache improved with med, no prob with sleep Review of Systems -No chest pain or SOB, no abd or rectal pain Objective: Vital Signs: Blood pressure 115/77, pulse 68, temperature 98.6 F (37 C), temperature source Oral, resp. rate 16, height 6' (1.829 m), weight 59.8 kg (131 lb 13.4 oz), SpO2 98.00%. No results found. No results found for this or any previous visit (from the past 72 hour(s)).    Constitutional: He appears alert, no distress. Resting in bed quietly until I entered room HENT: poor dentition,  Head: Normocephalic. atraumatic Eyes:  Pupils EOMI, without nystagmus  Neck: Normal range of motion. Neck supple. No thyromegaly present.  Cardiovascular: Normal rate and regular rhythm. No murmurs  Respiratory: Effort normal and breath sounds normal. No respiratory distress. No wheezes, rales, rhonchi  GI: Soft. Bowel sounds are normal. He exhibits no distension.  Neurological:  . Oriented to name and hosp. He did  follow one and two step commands.. 1/5 left shoulder ,grip was 2-/5. LLE was 2/5 with HF and KE and trace at the ankle.  Speech moderately dysarthric.  RUE 4/5 RLE 5/5 Skin: Skin is warm and dry.  Psychiatric:  Flat,  MSK:  - SLR    Assessment/Plan: 1. 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 Physiatrist is providing close team supervision and 24 hour management of active medical problems listed below. Physiatrist and rehab team continue to assess barriers to discharge/monitor patient progress toward functional and medical goals.  FIM: FIM - Bathing Bathing Steps Patient Completed: Chest;Left Arm;Abdomen;Front perineal area;Right upper leg;Left upper leg;Right Arm;Right lower leg (including foot);Left lower leg (including foot) Bathing: 4: Min-Patient completes 8-9 [id number] 10 parts or 75+ percent  FIM - Upper Body Dressing/Undressing Upper body dressing/undressing steps patient completed: Thread/unthread left sleeve of pullover shirt/dress;Put head through opening of pull over shirt/dress;Thread/unthread right sleeve of pullover shirt/dresss;Pull shirt over trunk Upper body dressing/undressing: 5: Supervision: Safety issues/verbal cues FIM - Lower Body Dressing/Undressing Lower body dressing/undressing steps patient completed: Thread/unthread right pants leg;Pull pants up/down Lower body dressing/undressing: 1: Total-Patient completed less than 25% of tasks  FIM - Toileting Toileting steps completed by patient: Performs perineal hygiene Toileting Assistive Devices: Grab bar or rail for support Toileting: 2: Max-Patient completed 1 of 3 steps  FIM - Diplomatic Services operational officerToilet Transfers Toilet Transfers Assistive Devices: Grab bars Toilet Transfers: 4-From toilet/BSC: Min A (steadying Pt. > 75%);4-To toilet/BSC: Min A (steadying Pt. > 75%)  FIM  - Bed/Chair Transfer Bed/Chair Transfer Assistive Devices: Arm rests Bed/Chair Transfer: 4:  Chair or W/C > Bed: Min A (steadying Pt. > 75%);4: Bed > Chair or W/C: Min A (steadying Pt. > 75%)  FIM - Locomotion: Wheelchair Distance: 150 Locomotion: Wheelchair: 1: Total Assistance/staff pushes wheelchair (Pt<25%) FIM - Locomotion: Ambulation Locomotion: Ambulation Assistive Devices: Designer, industrial/product Ambulation/Gait Assistance: 1: +2 Total assist Locomotion: Ambulation: 0: Activity did not occur  Comprehension Comprehension Mode: Auditory Comprehension: 5-Understands basic 90% of the time/requires cueing < 10% of the time  Expression Expression Mode: Verbal Expression: 4-Expresses basic 75 - 89% of the time/requires cueing 10 - 24% of the time. Needs helper to occlude trach/needs to repeat words.  Social Interaction Social Interaction: 4-Interacts appropriately 75 - 89% of the time - Needs redirection for appropriate language or to initiate interaction.  Problem Solving Problem Solving: 3-Solves basic 50 - 74% of the time/requires cueing 25 - 49% of the time  Memory Memory: 4-Recognizes or recalls 75 - 89% of the time/requires cueing 10 - 24% of the time  Medical Problem List and Plan:  1. 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 DVT Prophylaxis/Anticoagulation: SCDs. Monitor for any signs of DVT  3. Pain Management: Tylenol as needed. 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 Neuropsych: This patient is not capable of making decisions on his own behalf.  5. Seizure prophylaxis. Keppra 500 mg twice a day. Monitor for any seizure activity  6. Hypertension. Norvasc 10 mg daily,D/C lisinopril 2.5, propranolol 10mg  TID will increase to 20mg m                          7. Dysphagia.[name] dysphagia 3 thin liquids. Followup speech therapy  ,appetitie improved off  megace ,cont Remeron 8. History of tobacco alcohol abuse. Urine drug screen positive cocaine and marijuana.  9.  Rectal pain,secondary to impaction resolved, continence improving       LOS (Days) 21 A FACE TO FACE EVALUATION WAS PERFORMED  Monzerrat Wellen E 03/23/2013, 7:29 AM

## 2013-03-23 NOTE — Progress Notes (Signed)
Recreational Therapy Session Note  Patient Details  Name: Gerre Pebblesnthony Losurdo MRN: [id number] Date of Birth: 1960/03/26 Today's Date: 03/23/2013  Pain: no c/o Skilled Therapeutic Interventions/Progress Updates: Session focused on completion of dressing and application of vaseline to skin per pt request while seated in w/c.  Pt required set up assist.  Pt ambulated 3 muscateer style with +2 assist, pt =~80% to & from therapy. Pt stood working on tabletop activity with focus on visual scanning/attention with min cues.  Therapy/Group: Co-Treatment  Estefano Victory 03/23/2013, 12:36 PM

## 2013-03-23 NOTE — Progress Notes (Signed)
NUTRITION FOLLOW UP  Intervention:    1. Continue to encourage intake at meals  2.Resource Breeze po TID, each supplement provides 250 kcal and 9 grams of protein  NUTRITION DIAGNOSIS:  Inadequate oral intake related to poor appetite as evidenced by PO intake variable at meals- progressing  Monitor:  1. Food/Beverage; pt meeting >/=90% estimated needs with tolerance - met  2. Wt/wt change; monitor trends - pt's weight down 1 pound since admission   Assessment:   Pt admitted with deconditioning r/t right temporoparietal ICH.  1/15 - Pt's intake has not improved.  Remains poor at 15% of meals.  MD started Megace today. Pt is refusing supplements.  Will d/c ensure and order Breeze.  Met with pt who is uncomfortable during visit.  States he is having pain "everytime I go to the bathroom".  Pt reports he doesn't like Ensure, but unable to state why. Encouraged intake.   1/22 - Had rapid response 1/17 for hypotension and unresponsiveness. C/o rectal pain 1/18 and blood was noted in his diaper, x-ray showed fecal impaction and pt was given Dulcolax suppository and Colace and had multiple BMs that evening. Had intermittent rectal pain 1/20 and had 3 BMs with scant amount of blood noted from hemorrhoids. Attempted multiple times to visit with pt today, however pt on the phone each time. Per conversation with RN, pt eating 100% of meals and supplements and reports good appetite. Did not complain of any rectal pain to RN today.   1/28 - PO intake has been anywhere from 10-100%, majority of meals are > 75%, however pt continues to not eat well at some of his meals.  Per staff pt easily upsets and this sometimes causes him to eat less. Pt c/o getting too much mac and cheese and cheese pizza, he is worried about constipation. Pt loves Peach and Arabella Merles and reports drinking 2-3 per day.    Height: Ht Readings from Last 1 Encounters:  03/02/13 6' (1.829 m)    Weight Status:   Wt Readings from  Last 1 Encounters:  03/16/13 131 lb 13.4 oz (59.8 kg)  Admit wt:        132 lb 7.9 oz (60.1 kg)  Re-estimated needs:  Kcal: 2100-2280  Protein: 90-110g  Fluid: >2.1 L/day  Skin: intact  Diet Order: General   Intake/Output Summary (Last 24 hours) at 03/23/13 1316 Last data filed at 03/23/13 0909  Gross per 24 hour  Intake    240 ml  Output    300 ml  Net    -60 ml    Last BM: 1/28   Labs:   Recent Labs Lab 03/18/13 0600  NA 140  K 4.1  CL 108  CO2 20  BUN 10  CREATININE 0.68  CALCIUM 9.3  GLUCOSE 82    CBG (last 3)  No results found for this basename: GLUCAP,  in the last 72 hours  Scheduled Meds: . amLODipine  10 mg Oral Daily  . antiseptic oral rinse  15 mL Mouth Rinse BID  . feeding supplement (RESOURCE BREEZE)  1 Container Oral TID BM  . folic acid  1 mg Oral Daily  . gabapentin  100 mg Oral TID  . levETIRAcetam  500 mg Oral BID  . mirtazapine  15 mg Oral QHS  . multivitamin with minerals  1 tablet Oral Daily  . nicotine  21 mg Transdermal Daily  . pantoprazole  40 mg Oral Daily  . potassium chloride  20 mEq  Oral Daily  . propranolol  20 mg Oral TID  . QUEtiapine  50 mg Oral QHS  . senna-docusate  3 tablet Oral BID  . sorbitol, milk of mag, mineral oil, glycerin (SMOG) enema  960 mL Rectal Once  . thiamine  100 mg Oral Daily     Maylon Peppers RD, LDN, CNSC [phone] Pager [phone] After Hours Pager

## 2013-03-23 NOTE — Progress Notes (Signed)
Physical Therapy Weekly Progress Note  Patient Details  Name: Eric May MRN: [id number] Date of Birth: January 29, 1961  Today's Date: 03/23/2013 Time: 6060-0459 Time Calculation (min): 50 min  Patient continues to make steady progress and has met 2 of 7 long term goals.  Short term goals not set due to expected D/C to SNF.  Upon the resolution of bowel issues and pain pt has demonstrated improved activity tolerance and improved participation in therapy with decreased frustration and agitation towards staff.  Pt is currently supervision-min A for bed mobility, bed <> w/c transfers, w/c mobility, standing balance and mod-max A for gait and stair negotiation.  Pt still awaiting acceptance to SNF secondary to family inability to provide 24/7 supervision and assistance.      Patient continues to demonstrate the following deficits: L hemiplegia with impaired motor control, timing, sequencing, L visual field cut and L inattention, impaired cognition and safety awareness, impaired balance, gait and therefore will continue to benefit from skilled PT intervention to enhance overall performance with balance, postural control, ability to compensate for deficits, functional use of  left upper extremity and left lower extremity, attention, awareness and coordination.  See Patient's Care Plan for progression toward long term goals.  Patient progressing toward long term goals..  Plan of care revisions: Goals upgraded secondary to progress.  Floor transfer added, stair goal added for LE strengthening..  Skilled Therapeutic Interventions/Progress Updates:   Skilled therapy treatment session with recreation therapy.  Pt in recliner getting medication from RN.  Pt upset about not getting grits with breakfast; when offered to take pt to kitchen to make grits pt refused secondary to being full now.  Assisted pt with donning L black Allard AFO for gait.  Performed NMR; see below for details of NMR paired with visual  scanning/attention task.  Returned to room and to recliner.  Pt set up with all items within reach.      Therapy Documentation Precautions:  Precautions Precautions: Fall Restrictions Weight Bearing Restrictions: No Pain: Pain Assessment Pain Assessment: No/denies pain Locomotion : Ambulation Ambulation/Gait Assistance: 1: +2 Total assist  Other Treatments: Treatments Neuromuscular Facilitation: Left;Lower Extremity;Activity to increase coordination;Activity to increase motor control;Activity to increase timing and sequencing;Activity to increase sustained activation;Activity to increase lateral weight shifting;Activity to increase anterior-posterior weight shifting during gait x 150' x 2 reps with +2 (3 musketeers) but pt performing 80%; pt able to advance and place LLE and maintain knee stabilization in stance 80% of the time; intermittent tactile cues needed to advance LLE and maintain upright trunk.  Also performed NMR in standing with focus on selective attention to task in distracting environment, maintaining WB through LLE and LUE and use of LUE as visual cue/starting point for L > R visual scanning task to find #88 in multiple rows of random numbers with mod verbal cues for scanning fully  L > R.  Pt tolerated well but reported eye fatigue and LE fatigue during standing.   See FIM for current functional status  Therapy/Group: Co-Treatment with recreation therapy  Raylene Everts [facility] 03/23/2013, 11:25 AM

## 2013-03-23 NOTE — Progress Notes (Signed)
Speech Language Pathology Daily Session Note  Patient Details  Name: Eric May MRN: [id number] Date of Birth: [birth year]  Today's Date: 03/23/2013 Time: 1030-1100  Time Calculation (min): 30 min  Short Term Goals: Week 3: SLP Short Term Goal 1 (Week 3): Patient will sustain attention to basic self-care task for 5-8 minutes with Mod clinician cues. SLP Short Term Goal 2 (Week 3): Patient will solve basic problems with Mod verbal cues. SLP Short Term Goal 3 (Week 3): Patient will request help with Max questions cues. SLP Short Term Goal 4 (Week 3): Patient will consume regular textures and thin liquids with Mod cues for portion control to prevent overt s/s of aspiration.  SLP Short Term Goal 5 (Week 3): Patient will maintain topic of conversation for 3 turns with Mod assist clinician cues  Skilled Therapeutic Interventions: Skilled treatment session focused on addressing cognition goals.  SLP facilitated session with requests to recall and instruct SLP how to play a card game that was familiar to him from prior to admission.  Patient required Max question cues to verbally instruct how to play the game and clarify the rules of game.  Patient became frustrated and refused to further teach the game saying that the therapist just didn't get it.  SLP also facilitated session with transfer assist and self-care during toileting.  Patient required Min assist until he became frustrated/inpatient and transferred himself back to the recliner with wheelchair in middle of the room with brakes unlocked; patient then required Total assist to prevent fall.  Recommend social Investment banker, operationalworker/case manager discuss realistic discharge plan with patient to attempt to decrease frustration.  Continue with current plan of care.   FIM:  Comprehension Comprehension Mode: Auditory Comprehension: 4-Understands basic 75 - 89% of the time/requires cueing 10 - 24% of the time Expression Expression Mode: Verbal Expression:  3-Expresses basic 50 - 74% of the time/requires cueing 25 - 50% of the time. Needs to repeat parts of sentences. Social Interaction Social Interaction: 3-Interacts appropriately 50 - 74% of the time - May be physically or verbally inappropriate. Problem Solving Problem Solving: 2-Solves basic 25 - 49% of the time - needs direction more than half the time to initiate, plan or complete simple activities Memory Memory: 3-Recognizes or recalls 50 - 74% of the time/requires cueing 25 - 49% of the time  Pain Pain Assessment Pain Assessment: No/denies pain  Therapy/Group: Individual Therapy  Charlane FerrettiMelissa Jamaine Quintin, M.A., CCC-SLP [phone]  Calogero Geisen 03/23/2013, 11:52 AM

## 2013-03-24 ENCOUNTER — Inpatient Hospital Stay (HOSPITAL_COMMUNITY): Payer: Medicaid Other | Admitting: Physical Therapy

## 2013-03-24 ENCOUNTER — Encounter (HOSPITAL_COMMUNITY): Payer: Self-pay | Admitting: Occupational Therapy

## 2013-03-24 ENCOUNTER — Inpatient Hospital Stay (HOSPITAL_COMMUNITY): Payer: Medicaid Other | Admitting: Speech Pathology

## 2013-03-24 NOTE — Progress Notes (Signed)
Recreational Therapy Session Note  Patient Details  Name: Gerre Pebblesnthony Avants MRN: [id number] Date of Birth: Jul 03, 1960 Today's Date: 03/24/2013  Pain: no c/o Skilled Therapeutic Interventions/Progress Updates: Upon entry to the room, informed by PT & nursing pt very upset and refusing everything scheduled prior to this session.  Allowed pt time to eat breakfast and provided emotional support to de- escalate pt's behavior.  After acknowledging pt's reason for frustration and review of what we were there to accomplish, pt agreeable to participate in session.  Assisted pt with dressing activity seated EOB and then ambulated 3 muscateers style with +2 assist, pt=~90%.  P &O there for assess pt for appropriate bracing.  While taking seated rest break, pt apologetic about prior behavior and stated he wanted to work hard to get better and get out of the hospital.  Reviewed pt progress thus far with pt and encouraged further participation during LOS.    Therapy/Group: Co-Treatment Makendra Vigeant 03/24/2013, 3:36 PM

## 2013-03-24 NOTE — Progress Notes (Signed)
53 y.o. right-handed male with history of hypertension, tobacco and alcohol abuse. Admitted 02/23/2013 when patient developed left-sided numbness and weakness while at work. Patient reports falling at that time due to weakness on the left. Blood pressure 194/117. CT of the head showed acute parenchymal hemorrhage over the right temporal parietal region measuring 4.9 x 3.2 cm with local mass effect and midline shift of 4 mm. Echocardiogram with ejection fraction 65% no valvular abnormalities. Carotid Dopplers with no ICA stenosis. MRI of the brain 02/24/2013 again showed intraparenchymal hematoma 5.2 x 3.7 x 3.3 cm with surrounding vasogenic edema. Neurosurgery Dr. Conchita Paris consult advise conservative management with repeat serial cranial CT scans. Urine drug screen was positive for cocaine as well as marijuana. Maintained on Cardene drip for blood pressure control.  Diet advance to dysphagia 3 thin liquids 03/01/2013. Keppra for seizure prophylaxis  Subjective/Complaints: Slept ok, discussed current functional status and need for 24/7 care as well as pt's family unable to provide this level of assistance Review of Systems -No chest pain or SOB, no abd or rectal pain Objective: Vital Signs: Blood pressure 120/76, pulse 86, temperature 97.6 F (36.4 C), temperature source Oral, resp. rate 18, height 6' (1.829 m), weight 59.8 kg (131 lb 13.4 oz), SpO2 100.00%. No results found. Results for orders placed during the hospital encounter of 03/02/13 (from the past 72 hour(s))  OCCULT BLOOD X 1 CARD TO LAB, STOOL     Status: None   Collection Time    03/23/13 10:59 AM      Result Value Range   Fecal Occult Bld NEGATIVE  NEGATIVE      Constitutional: He appears alert, no distress. Resting in bed quietly until I entered room HENT: poor dentition,  Head: Normocephalic. atraumatic Eyes:  Pupils EOMI, without nystagmus  Neck: Normal range of motion. Neck supple. No thyromegaly present.  Cardiovascular:  Normal rate and regular rhythm. No murmurs  Respiratory: Effort normal and breath sounds normal. No respiratory distress. No wheezes, rales, rhonchi  GI: Soft. Bowel sounds are normal. He exhibits no distension.  Neurological:  . Oriented to name and hosp. He did follow one and two step commands.. 1/5 left shoulder ,grip was 2-/5. LLE was 2/5 with HF and KE and trace at the ankle.  Speech moderately dysarthric.  RUE 4/5 RLE 5/5 Skin: Skin is warm and dry.  Psychiatric:  Flat,  MSK:  - SLR    Assessment/Plan: 1. 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 Physiatrist is providing close team supervision and 24 hour management of active medical problems listed below. Physiatrist and rehab team continue to assess barriers to discharge/monitor patient progress toward functional and medical goals.  FIM: FIM - Bathing Bathing Steps Patient Completed: Chest;Left Arm;Abdomen;Front perineal area;Right upper leg;Left upper leg;Right Arm;Right lower leg (including foot);Left lower leg (including foot) Bathing: 4: Min-Patient completes 8-9 24f 10 parts or 75+ percent  FIM - Upper Body Dressing/Undressing Upper body dressing/undressing steps patient completed: Thread/unthread left sleeve of pullover shirt/dress;Put head through opening of pull over shirt/dress;Thread/unthread right sleeve of pullover shirt/dresss;Pull shirt over trunk Upper body dressing/undressing: 5: Set-up assist to: Obtain clothing/put away FIM - Lower Body Dressing/Undressing Lower body dressing/undressing steps patient completed: Thread/unthread right pants leg;Pull pants up/down;Pull underwear up/down;Don/Doff right shoe Lower body dressing/undressing: 2: Max-Patient completed 25-49% of tasks  FIM - Toileting Toileting steps completed by patient: Adjust  clothing  prior to toileting;Performs perineal hygiene Toileting Assistive Devices: Grab bar or rail for support Toileting: 3: Mod-Patient completed 2 of 3 steps  FIM - Diplomatic Services operational officerToilet Transfers Toilet Transfers Assistive Devices: Grab bars Toilet Transfers: 4-From toilet/BSC: Min A (steadying Pt. > 75%);4-To toilet/BSC: Min A (steadying Pt. > 75%)  FIM - Bed/Chair Transfer Bed/Chair Transfer Assistive Devices: Arm rests Bed/Chair Transfer: 4: Chair or W/C > Bed: Min A (steadying Pt. > 75%);4: Bed > Chair or W/C: Min A (steadying Pt. > 75%)  FIM - Locomotion: Wheelchair Distance: 150 Locomotion: Wheelchair: 0: Activity did not occur FIM - Locomotion: Ambulation Locomotion: Ambulation Assistive Devices: Other (comment) (+2 A) Ambulation/Gait Assistance: 1: +2 Total assist Locomotion: Ambulation: 1: Two helpers  Comprehension Comprehension Mode: Auditory Comprehension: 4-Understands basic 75 - 89% of the time/requires cueing 10 - 24% of the time  Expression Expression Mode: Verbal Expression: 3-Expresses basic 50 - 74% of the time/requires cueing 25 - 50% of the time. Needs to repeat parts of sentences.  Social Interaction Social Interaction: 3-Interacts appropriately 50 - 74% of the time - May be physically or verbally inappropriate.  Problem Solving Problem Solving: 2-Solves basic 25 - 49% of the time - needs direction more than half the time to initiate, plan or complete simple activities  Memory Memory: 3-Recognizes or recalls 50 - 74% of the time/requires cueing 25 - 49% of the time  Medical Problem List and Plan:  1. 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 DVT Prophylaxis/Anticoagulation: SCDs. Monitor for any signs of DVT  3. Pain Management: Tylenol as needed. 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 Neuropsych: This patient is not  capable of making decisions on his own behalf.  5. Seizure prophylaxis. Keppra 500 mg twice a day. Monitor for any seizure activity  6. Hypertension. Norvasc 10 mg daily,D/C lisinopril 2.5, propranolol 10mg  TID will increase to 20mg                          7. Dysphagia.[name]. dysphagia 3 thin liquids. Followup speech therapy ,appetitie improved off  megace ,cont Remeron 8. History of tobacco alcohol abuse. Urine drug screen positive cocaine and marijuana.  9.  Rectal pain,secondary to impaction resolved, continence improving, stool OB -       LOS (Days) 22 A FACE TO FACE EVALUATION WAS PERFORMED  Reshunda Strider E 03/24/2013, 7:20 AM

## 2013-03-24 NOTE — Progress Notes (Signed)
Physical Therapy Session Note  Patient Details  Name: Eric May MRN: [id number] Date of Birth: 21-Aug-1960  Today's Date: 03/24/2013 Time: [id number] Time Calculation (min): 51 min  Short Term Goals: No short term goals set  Skilled Therapeutic Interventions/Progress Updates:   Treatment session with recreation therapist with P&O present to assess pt for appropriate Allard AFO for LLE.  Pt still in bed eating breakfast upon arrival.  Pt initially very upset and frustrated about receiving breakfast late and that his clothes were not yet out of the laundry room.  Allowed pt time to eat and provided emotional support to de-escalate pt and explain purpose of P&O's visit.  Pt agreeable to participate.  Performed supine > sit EOB with min A and assisted pt with donning pants and t-shirt.  Performed gait x 75' x 2 with +2 A (3 musketeers) with pt performing 90% with therapists only providing balance support; pt able to maintain upright trunk, lateral and anterior weight shifting and advancement, placement and stabilization of LLE without assistance.  P&O agreed that pt would benefit from black Toe off Allard in L shoe.  P&O to adjust length of foot plate to fit shoe and return with pt AFO this pm to begin using tomorrow.  While resting pt apologetic for his outburst this am; he verbalized that he still wanted to work hard in therapy in order to get better and get out of the hospital.  Pt returned to room and seated in w/c with quick release belt in place until SLP arrived for session.    Therapy Documentation Precautions:  Precautions Precautions: Fall Restrictions Weight Bearing Restrictions: No Vital Signs: Therapy Vitals Pulse Rate: 83 Pain: Pain Assessment Pain Assessment: No/denies pain Locomotion : Ambulation Ambulation/Gait Assistance: 1: +2 Total assist   See FIM for current functional status  Therapy/Group: Individual Therapy and Co-Treatment  Edman CircleHall, Nemiah Kissner [facility] 03/24/2013,  12:18 PM

## 2013-03-24 NOTE — Progress Notes (Signed)
Speech Language Pathology Daily Session Note  Patient Details  Name: Eric May MRN: [id number] Date of Birth: 05-02-1960  Today's Date: 03/24/2013 Time: [id number] Time Calculation (min): 42 min  Short Term Goals: Week 3: SLP Short Term Goal 1 (Week 3): Patient will sustain attention to basic self-care task for 5-8 minutes with Mod clinician cues. SLP Short Term Goal 2 (Week 3): Patient will solve basic problems with Mod verbal cues. SLP Short Term Goal 3 (Week 3): Patient will request help with Max questions cues. SLP Short Term Goal 4 (Week 3): Patient will consume regular textures and thin liquids with Mod cues for portion control to prevent overt s/s of aspiration.  SLP Short Term Goal 5 (Week 3): Patient will maintain topic of conversation for 3 turns with Mod assist clinician cues  Skilled Therapeutic Interventions: Skilled treatment session focused on addressing cognition goals.  Patient participated in card game, which addressed left attention and working memory with serial addition.  SLP provided Mod question and visual cues for left attention and for self-monitoring and correcting with calculations.  Patient with increased patience and participation in today's session.  Continue with current plan of care.   FIM:  Comprehension Comprehension Mode: Auditory Comprehension: 4-Understands basic 75 - 89% of the time/requires cueing 10 - 24% of the time Expression Expression: 4-Expresses basic 75 - 89% of the time/requires cueing 10 - 24% of the time. Needs helper to occlude trach/needs to repeat words. Social Interaction Social Interaction: 4-Interacts appropriately 75 - 89% of the time - Needs redirection for appropriate language or to initiate interaction. Problem Solving Problem Solving: 3-Solves basic 50 - 74% of the time/requires cueing 25 - 49% of the time Memory Memory: 4-Recognizes or recalls 75 - 89% of the time/requires cueing 10 - 24% of the time  Pain Pain  Assessment Pain Assessment: No/denies pain  Therapy/Group: Individual Therapy  Charlane FerrettiMelissa Kaius Daino, M.A., CCC-SLP [phone]  [name] 03/24/2013, 12:26 PM

## 2013-03-24 NOTE — Progress Notes (Signed)
Occupational Therapy Note  Patient Details  Name: Eric May MRN: [id number] Date of Birth: Mar 22, 1960 Today's Date: 03/24/2013  Pt missed 45 mins skilled OT treatment session secondary to refusal.  Upon arrival pt reports "I am not getting out of bed, I had some bad news already this morning." With attempts to encourage pt continued to refuse.  Notified RN and nurse tech who report he refused to get OOB for breakfast this AM.  Will follow up as able.  [name], [name] 03/24/2013, 9:09 AM

## 2013-03-24 NOTE — Progress Notes (Signed)
Social Work Patient ID: Eric May, male   DOB: [birth year], 53 y.o.   MRN: [id number] Met with pt to discuss his concerns and wanting to get out of the hospital and being able to go home to his Mom's home.  Discussed his Mom does not feel She can handle him or provide the care he needs at home.  Pt states; " I can do for myself and have so many other family members who will take care of me." Have spoke with [name]-girlfriend who is willing but would like his Medicaid approved, which this may take months and will await disability to approve. Discussed the search for NH bed and he responded: " I am ready to get out of the hospital."  Will contact Mom to discuss this with pt her concerns.

## 2013-03-25 ENCOUNTER — Inpatient Hospital Stay (HOSPITAL_COMMUNITY): Payer: Medicaid Other | Admitting: Occupational Therapy

## 2013-03-25 ENCOUNTER — Inpatient Hospital Stay (HOSPITAL_COMMUNITY): Payer: Medicaid Other | Admitting: Physical Therapy

## 2013-03-25 ENCOUNTER — Inpatient Hospital Stay (HOSPITAL_COMMUNITY): Payer: Medicaid Other | Admitting: Speech Pathology

## 2013-03-25 DIAGNOSIS — I69998 Other sequelae following unspecified cerebrovascular disease: Secondary | ICD-10-CM

## 2013-03-25 DIAGNOSIS — I619 Nontraumatic intracerebral hemorrhage, unspecified: Secondary | ICD-10-CM

## 2013-03-25 DIAGNOSIS — G8110 Spastic hemiplegia affecting unspecified side: Secondary | ICD-10-CM

## 2013-03-25 DIAGNOSIS — R209 Unspecified disturbances of skin sensation: Secondary | ICD-10-CM

## 2013-03-25 NOTE — Progress Notes (Signed)
Social Work Patient ID: Eric May, male   DOB: Oct 24, 1960, 53 y.o.   MRN: [id number] Spoke with [name]-pt's girlfriend when here to discuss if option is home with her.  She reports: " I am not family and would not want to step on toes." She would want pt to have medical insurance-pending medicaid to get his medicines at discharge.  Informed her his disability and Medicaid is pending and may take Another month or two to get approved.  At this point pt is still NHP and will continue to look for a bed.

## 2013-03-25 NOTE — Progress Notes (Signed)
Speech Language Pathology Daily Session & Weekly Progress Notes  Patient Details  Name: Azavier Creson MRN: [id number] Date of Birth: [birth year]  Today's Date: 03/25/2013 Time: 7001-7494 Time Calculation (min): 47 min  Short Term Goals: Week 3: SLP Short Term Goal 1 (Week 3): Patient will sustain attention to basic self-care task for 5-8 minutes with Mod clinician cues. SLP Short Term Goal 1 - Progress (Week 3): Met SLP Short Term Goal 2 (Week 3): Patient will solve basic problems with Mod verbal cues. SLP Short Term Goal 2 - Progress (Week 3): Met SLP Short Term Goal 3 (Week 3): Patient will request help with Max questions cues. SLP Short Term Goal 3 - Progress (Week 3): Met SLP Short Term Goal 4 (Week 3): Patient will consume regular textures and thin liquids with Mod cues for portion control to prevent overt s/s of aspiration.  SLP Short Term Goal 4 - Progress (Week 3): Met SLP Short Term Goal 5 (Week 3): Patient will maintain topic of conversation for 3 turns with Mod assist clinician cues SLP Short Term Goal 5 - Progress (Week 3): Met  Week 4: SLP Short Term Goal 1 (Week 4): Patient will select attention to task for 10 minutes with Min clinician cues. SLP Short Term Goal 2 (Week 4): Patient will solve moderately complex problems with Mod verbal cues. SLP Short Term Goal 3 (Week 4): Patient will request help with Mod questions cues. SLP Short Term Goal 4 (Week 4): Patient will self-monitor and correct verbal perseveration with Max clinician cues.  Weekly Progress Updates: Patient met 5 out of 5 short terms goals during this reporting period due to gains in sustained attention with fewer cues for redirection, emergent awareness, basic problem solving and topic maintenance.  Patient's cognitive gains also positively impacted dysphagia goals.  Patient is now consuming regular textures and thin liquids with intermittent supervision and dysphagia goals are met.  He continues to demonstrate  moderate cognitive deficits characterized by poor awareness, self-monitoring and correcting which impact his ability to safely care for himself.  As a result, patient continues to require skilled SLP services to address his cognitive-linguistic abilities and safety with self-care tasks which will reduce burden of care prior to discharge.  SLP Intensity: Minumum of 1-2 x/day, 30 to 90 minutes SLP Frequency: 5 out of 7 days SLP Duration/Estimated Length of Stay: SNF SLP Treatment/Interventions: Cognitive remediation/compensation;Cueing hierarchy;Environmental controls;Functional tasks;Internal/external aids;Patient/family education;Therapeutic Activities  Daily Session Skilled Intervention:  Skilled treatment session focused on addressing cognition goals.  Patient participated in description activity, which addressed left attention, turn taking and working memory.  Patient required Mod question and visual cues for left attention and to keep track of whose turn it was and keep accurate score.  Patient required Min verbal cues for accurate description.  Girlfriend present for session today.  Continue with current plan of care. FIM:  Comprehension Comprehension Mode: Auditory Comprehension: 4-Understands basic 75 - 89% of the time/requires cueing 10 - 24% of the time Expression Expression Mode: Verbal Expression: 4-Expresses basic 75 - 89% of the time/requires cueing 10 - 24% of the time. Needs helper to occlude trach/needs to repeat words. Social Interaction Social Interaction: 4-Interacts appropriately 75 - 89% of the time - Needs redirection for appropriate language or to initiate interaction. Problem Solving Problem Solving: 3-Solves basic 50 - 74% of the time/requires cueing 25 - 49% of the time Memory Memory: 3-Recognizes or recalls 50 - 74% of the time/requires cueing 25 - 49% of the time  FIM - Eating Eating Activity: 5: Set-up assist for cut food;5: Set-up assist for open containers;5:  Supervision/cues Pain Pain Assessment Pain Assessment: No/denies pain  Therapy/Group: Individual Therapy  Carmelia Roller., CCC-SLP [phone]  [place] 03/25/2013, 12:26 PM

## 2013-03-25 NOTE — Progress Notes (Signed)
Occupational Therapy Weekly Progress Note  Patient Details  Name: Eric May MRN: [id number] Date of Birth: 10/15/1960  Today's Date: 03/25/2013 Time: 0840-0915 Time Calculation (min): 35 min  Patient has met 4 of 5 short term goals.  Pt is making slow but steady progress towards goals with improvements in pain and participation in therapy, however continues to fluctuate based on motivation and mood. Pt is currently min assist basic transfers, including toilet and shower transfers, min-supervision assist UB dressing, and max-total assist LB dressing. Pt is developing awareness of deficits and intermittent increased recall of education.  Patient continues to demonstrate the following deficits: Lt hemiparesis with absent sensation and proprioception, Lt inattention, impaired Lt vision, impaired arousal and cognition with impaired sustained attention, impaired awareness, impaired memory, problem solving and sequencing with very poor frustration tolerance and perseveration, impaired balance, activity tolerance and therefore will continue to benefit from skilled OT intervention to enhance overall performance with BADL and Reduce care partner burden.  Patient progressing toward long term goals..  Continue plan of care. Upgraded goals to min/supervision overall except mod assist for LB dressing and toileting hygiene  OT Short Term Goals Week 3:  OT Short Term Goal 1 (Week 3): Pt will complete UB dressing with min assist OT Short Term Goal 1 - Progress (Week 3): Met OT Short Term Goal 2 (Week 3): Pt will complete LB dressing with max assist OT Short Term Goal 2 - Progress (Week 3): Met OT Short Term Goal 3 (Week 3): Pt will complete toilet tranfer with min assist 3 consecutive sessions OT Short Term Goal 3 - Progress (Week 3): Met OT Short Term Goal 4 (Week 3): Pt will complete toileting (hygiene and clothing management) with mod assist OT Short Term Goal 4 - Progress (Week 3): Met OT Short Term  Goal 5 (Week 3): Pt will use LUE as gross assist with self-care tasks OT Short Term Goal 5 - Progress (Week 3): Progressing toward goal Week 4:  OT Short Term Goal 1 (Week 4): STG = LTGs  Skilled Therapeutic Interventions/Progress Updates:    Engaged in functional transfer training and dressing with focus on increased use of LUE.  Pt asleep upon arrival requiring increased time to arouse.  Once aroused pt willing to get OOB to dress and begin breakfast.  Toilet transfer w/c > toilet with min assist with use of grab bar with manual facilitation at LLE for placement.  Pt with increased frustration this session, therefore less receptive to education with hemi-dressing technique and requesting increased assist.  Pt performed stand pivot transfer w/c > recliner with min assist due to impulsivity and quick movements with transfer.  Setup for breakfast and RN present to provide meds at end of session.  Therapy Documentation Precautions:  Precautions Precautions: Fall Restrictions Weight Bearing Restrictions: No General: General Amount of Missed OT Time (min): 10 Minutes Vital Signs: Therapy Vitals Temp: 99.2 F (37.3 C) Temp src: Oral Pulse Rate: 71 Resp: 17 BP: 122/86 mmHg Patient Position, if appropriate: Lying Oxygen Therapy SpO2: 99 % O2 Device: None (Room air) Pain:  Pt with no c/o pain this session.  See FIM for current functional status  Therapy/Group: Individual Therapy  ,  03/25/2013, 9:16 AM   

## 2013-03-25 NOTE — Plan of Care (Signed)
Problem: RH BLADDER ELIMINATION Goal: RH STG MANAGE BLADDER WITH ASSISTANCE STG Manage Bladder With supervision  Outcome: Progressing Uses condom cath. At [facility].

## 2013-03-25 NOTE — Progress Notes (Signed)
53 y.o. right-handed male with history of hypertension, tobacco and alcohol abuse. Admitted 02/23/2013 when patient developed left-sided numbness and weakness while at work. Patient reports falling at that time due to weakness on the left. Blood pressure 194/117. CT of the head showed acute parenchymal hemorrhage over the right temporal parietal region measuring 4.9 x 3.2 cm with local mass effect and midline shift of 4 mm. Echocardiogram with ejection fraction 65% no valvular abnormalities. Carotid Dopplers with no ICA stenosis. MRI of the brain 02/24/2013 again showed intraparenchymal hematoma 5.2 x 3.7 x 3.3 cm with surrounding vasogenic edema. Neurosurgery Dr. Conchita Paris consult advise conservative management with repeat serial cranial CT scans. Urine drug screen was positive for cocaine as well as marijuana. Maintained on Cardene drip for blood pressure control.  Diet advance to dysphagia 3 thin liquids 03/01/2013. Keppra for seizure prophylaxis  Subjective/Complaints: Amb better with left AFO, affect brighter, no agitation Review of Systems -No chest pain or SOB, no abd or rectal pain Objective: Vital Signs: Blood pressure 122/86, pulse 71, temperature 99.2 F (37.3 C), temperature source Oral, resp. rate 17, height 6' (1.829 m), weight 59.8 kg (131 lb 13.4 oz), SpO2 99.00%. No results found. Results for orders placed during the hospital encounter of 03/02/13 (from the past 72 hour(s))  OCCULT BLOOD X 1 CARD TO LAB, STOOL     Status: None   Collection Time    03/23/13 10:59 AM      Result Value Range   Fecal Occult Bld NEGATIVE  NEGATIVE      Constitutional: He appears alert, no distress. Resting in bed quietly until I entered room HENT: poor dentition,  Head: Normocephalic. atraumatic Eyes:  Pupils EOMI, without nystagmus  Neck: Normal range of motion. Neck supple. No thyromegaly present.  Cardiovascular: Normal rate and regular rhythm. No murmurs  Respiratory: Effort normal and  breath sounds normal. No respiratory distress. No wheezes, rales, rhonchi  GI: Soft. Bowel sounds are normal. He exhibits no distension.  Neurological:  . Oriented to name and hosp. He did follow one and two step commands.. 1/5 left shoulder ,grip was 2-/5. LLE was 2/5 with HF and KE and trace at the ankle.  Speech moderately dysarthric.  RUE 4/5 RLE 5/5 Skin: Skin is warm and dry.  Psychiatric:  Flat,  MSK:  - SLR    Assessment/Plan: 1. 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 Physiatrist is providing close team supervision and 24 hour management of active medical problems listed below. Physiatrist and rehab team continue to assess barriers to discharge/monitor patient progress toward functional and medical goals.  FIM: FIM - Bathing Bathing Steps Patient Completed: Chest;Left Arm;Abdomen;Front perineal area;Right upper leg;Left upper leg;Right Arm;Right lower leg (including foot);Left lower leg (including foot) Bathing: 4: Min-Patient completes 8-9 60f 10 parts or 75+ percent  FIM - Upper Body Dressing/Undressing Upper body dressing/undressing steps patient completed: Thread/unthread left sleeve of pullover shirt/dress;Put head through opening of pull over shirt/dress;Thread/unthread right sleeve of pullover shirt/dresss;Pull shirt over trunk Upper body dressing/undressing: 5: Set-up assist to: Obtain clothing/put away FIM - Lower Body Dressing/Undressing Lower body dressing/undressing steps patient completed: Thread/unthread right pants leg;Pull pants up/down;Pull underwear up/down;Don/Doff right shoe Lower body dressing/undressing: 2: Max-Patient completed 25-49% of tasks  FIM - Toileting Toileting steps completed by patient: Adjust clothing prior to toileting;Performs perineal hygiene Toileting Assistive Devices: Grab  bar or rail  for support Toileting: 3: Mod-Patient completed 2 of 3 steps  FIM - Diplomatic Services operational officerToilet Transfers Toilet Transfers Assistive Devices: Grab bars Toilet Transfers: 4-From toilet/BSC: Min A (steadying Pt. > 75%);4-To toilet/BSC: Min A (steadying Pt. > 75%)  FIM - Bed/Chair Transfer Bed/Chair Transfer Assistive Devices: Bed rails;HOB elevated Bed/Chair Transfer: 4: Supine > Sit: Min A (steadying Pt. > 75%/lift 1 leg);3: Bed > Chair or W/C: Mod A (lift or lower assist);3: Chair or W/C > Bed: Mod A (lift or lower assist)  FIM - Locomotion: Wheelchair Distance: 150 Locomotion: Wheelchair: 0: Activity did not occur FIM - Locomotion: Ambulation Locomotion: Ambulation Assistive Devices: Other (comment);Orthosis (+2 assist) Ambulation/Gait Assistance: 1: +2 Total assist Locomotion: Ambulation: 1: Two helpers  Comprehension Comprehension Mode: Auditory Comprehension: 4-Understands basic 75 - 89% of the time/requires cueing 10 - 24% of the time  Expression Expression Mode: Verbal Expression: 4-Expresses basic 75 - 89% of the time/requires cueing 10 - 24% of the time. Needs helper to occlude trach/needs to repeat words.  Social Interaction Social Interaction: 4-Interacts appropriately 75 - 89% of the time - Needs redirection for appropriate language or to initiate interaction.  Problem Solving Problem Solving: 3-Solves basic 50 - 74% of the time/requires cueing 25 - 49% of the time  Memory Memory: 4-Recognizes or recalls 75 - 89% of the time/requires cueing 10 - 24% of the time  Medical Problem List and Plan:  1. 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 DVT Prophylaxis/Anticoagulation: SCDs. Monitor for any signs of DVT  3. Pain Management: Tylenol as needed. 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 Neuropsych: This patient is not  capable of making decisions on his own behalf.  5. Seizure prophylaxis. Keppra 500 mg twice a day. Monitor for any seizure activity  6. Hypertension. Norvasc 10 mg daily,D/C lisinopril 2.5, propranolol 10mg  TID will increase to 20mg                          7. Dysphagia.[name]. dysphagia 3 thin liquids. Followup speech therapy ,appetitie improved off  megace ,cont Remeron 8. History of tobacco alcohol abuse. Urine drug screen positive cocaine and marijuana.  9.  Rectal pain,secondary to impaction resolved, continence improving, stool OB - 10.  Mood- improved on remeron      LOS (Days) 23 A FACE TO FACE EVALUATION WAS PERFORMED  Khameron Gruenwald E 03/25/2013, 7:14 AM

## 2013-03-25 NOTE — Progress Notes (Signed)
Physical Therapy Session Note  Patient Details  Name: Eric May MRN: [id number] Date of Birth: [birth year]  Today's Date: 03/25/2013 Time: 1132-1202 Time Calculation (min): 30 min  Short Term Goals: No short term goals set  Skilled Therapeutic Interventions/Progress Updates:   Girlfriend present to observe session and see how pt is progressing.  Per OT, girlfriend may consider having pt come live with her at D/C but still discussing.  Performed gait training with new Toe [place] LLE and RW with L hand orthosis x 50' x 2 reps with min A overall to maintain trunk control during RLE advancement (pt tends to pitch trunk forward to compensate for weak LLE extensors) and slight assistance needed for L pelvic anterior rotation during advancement.  Also discussed stairs at [employer]'s house and girlfriends' house.  Girlfriend reports mother has 4 + 5 stairs to enter house, no rail but that she has level entry/no stairs for entry.  Performed stair negotiation up/down 5 stairs x 2 reps with RUE support on rail and min-mod A with therapist providing L HHA for support/balance and verbal cues for safe step to sequence.  Girlfriend pleased with pt progress.  Pt returned to room in w/c and transferred w/c > recliner supervision.  Set up with all items within reach.    Therapy Documentation Precautions:  Precautions Precautions: Fall Restrictions Weight Bearing Restrictions: No Pain: Pain Assessment Pain Assessment: No/denies pain Pain Score: 5  Pain Location: Leg Pain Orientation: Left Pain Descriptors / Indicators: Aching Patients Stated Pain Goal: 2 Pain Intervention(s): Medication (See eMAR) Locomotion : Ambulation Ambulation/Gait Assistance: 4: Min assist   See FIM for current functional status  Therapy/Group: Individual Therapy  Edman CircleHall, [name] Faucette 03/25/2013, 12:11 PM

## 2013-03-26 NOTE — Progress Notes (Signed)
53 y.o. right-handed male with history of hypertension, tobacco and alcohol abuse. Admitted 02/23/2013 when patient developed left-sided numbness and weakness while at work. Patient reports falling at that time due to weakness on the left. Blood pressure 194/117. CT of the head showed acute parenchymal hemorrhage over the right temporal parietal region measuring 4.9 x 3.2 cm with local mass effect and midline shift of 4 mm. Echocardiogram with ejection fraction 65% no valvular abnormalities. Carotid Dopplers with no ICA stenosis. MRI of the brain 02/24/2013 again showed intraparenchymal hematoma 5.2 x 3.7 x 3.3 cm with surrounding vasogenic edema. Neurosurgery Dr. Conchita ParisNundkumar consult advise conservative management with repeat serial cranial CT scans. Urine drug screen was positive for cocaine as well as marijuana. Maintained on Cardene drip for blood pressure control.  Diet advance to dysphagia 3 thin liquids 03/01/2013. Keppra for seizure prophylaxis  Subjective/Complaints: His only complaint is of some left arm discomfort. No other specific complaints in the left arm discomfort is graded as mild. Objective: Vital Signs: Blood pressure 121/90, pulse 86, temperature 97.3 F (36.3 C), temperature source Oral, resp. rate 18, height 6' (1.829 m), weight 131 lb 13.4 oz (59.8 kg), SpO2 100.00%.  Well-developed male in no acute distress. Speech. Appears normal. Chest clear to auscultation Cardiac exam S1-S2 are regular Abdominal exam active bowel sounds, soft. Extremities no edema. Passive range of motion of the left shoulder appears normal. Assessment/Plan: 1. 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 DVT Prophylaxis/Anticoagulation: SCDs.  Monitor for any signs of DVT  3. Pain Management: Tylenol as needed. 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 Neuropsych: This patient is not capable of making decisions on his own behalf.  5. Seizure prophylaxis. Keppra 500 mg twice a day. Monitor for any seizure activity  6. Hypertension. Currently well controlled on amlodipine, propranolol. 7. Dysphagia.[name]. dysphagia 3 thin liquids. Followup speech therapy ,appetitie improved off  megace ,cont Remeron 8. History of tobacco alcohol abuse. Urine drug screen positive cocaine and marijuana.  9.  Rectal pain,secondary to impaction resolved, continence improving, stool OB - 10.  Mood- improved on remeron      LOS (Days) 24 A FACE TO FACE EVALUATION WAS PERFORMED  May,Eric May 03/26/2013, 9:32 AM

## 2013-03-27 ENCOUNTER — Inpatient Hospital Stay (HOSPITAL_COMMUNITY): Payer: Medicaid Other | Admitting: *Deleted

## 2013-03-27 NOTE — Progress Notes (Signed)
53 y.o. right-handed male with history of hypertension, tobacco and alcohol abuse. Admitted 02/23/2013 when patient developed left-sided numbness and weakness while at work. Patient reports falling at that time due to weakness on the left. Blood pressure 194/117. CT of the head showed acute parenchymal hemorrhage over the right temporal parietal region measuring 4.9 x 3.2 cm with local mass effect and midline shift of 4 mm. Echocardiogram with ejection fraction 65% no valvular abnormalities. Carotid Dopplers with no ICA stenosis. MRI of the brain 02/24/2013 again showed intraparenchymal hematoma 5.2 x 3.7 x 3.3 cm with surrounding vasogenic edema. Neurosurgery Dr. Conchita ParisNundkumar consult advise conservative management with repeat serial cranial CT scans. Urine drug screen was positive for cocaine as well as marijuana. Maintained on Cardene drip for blood pressure control.  Diet advance to dysphagia 3 thin liquids 03/01/2013. Keppra for seizure prophylaxis  Subjective/Complaints: Denies pain today Slept well Objective: Vital Signs: Blood pressure 112/79, pulse 82, temperature 98.1 F (36.7 C), temperature source Oral, resp. rate 19, height 6' (1.829 m), weight 131 lb 13.4 oz (59.8 kg), SpO2 100.00%.  Well-developed male in no acute distress. Speech. Appears normal. Chest clear to auscultation Cardiac exam S1-S2 are regular Abdominal exam active bowel sounds, soft. Extremities no edema. Passive range of motion of the left shoulder appears normal. Assessment/Plan: 1. 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 DVT Prophylaxis/Anticoagulation: SCDs. Monitor for any signs of DVT  3. Pain Management: Tylenol as needed. Gabapentin for dysesthetic  central pain secondary to CVA,monitor for SE 4. Neuropsych: This patient is not capable of making decisions on his own behalf.  5. Seizure prophylaxis. Keppra 500 mg twice a day. Monitor for any seizure activity  6. Hypertension. Currently well controlled on amlodipine, propranolol. 7. Dysphagia.[name]. dysphagia 3 thin liquids. Followup speech therapy ,appetitie improved off  megace ,cont Remeron 8. History of tobacco alcohol abuse. Urine drug screen positive cocaine and marijuana.  9.  Rectal pain,secondary to impaction resolved, continence improving, stool OB - 10.  Mood- improved on remeron      LOS (Days) 25 A FACE TO FACE EVALUATION WAS PERFORMED  SWORDS,BRUCE HENRY 03/27/2013, 10:29 AM

## 2013-03-27 NOTE — Progress Notes (Signed)
Occupational Therapy Note  Patient Details  Name: Eric May MRN: [id number] Date of Birth: 01-05-1961 Today's Date: 03/27/2013  Time:  1600-1655  (55 min) Pain:  none Individual session  Skilled therapeutic intervention with focus on functional mobility, LUE NMRE and transfers.  Pt sitting in wc upon OT arrival.  Six family members present +girlfriend ([name]).   Pt. Taken to gym.in wc.  Used parallel going forward and backward with min assist.  Placed LUE in WB position on bar and pt able to maintain.  Used 2RW with hand positioner on Left side and ambulated in gym with mod cues to stay in middle of walker ;plus min assist.  Addressed LUE AROM.  Provided verbal instructions to patient and family for 3 LUE exercises:  1. Make counter clockwise circles with  left hand  with elbow extended and shoulder flexed at @ 45 degrees.   2.  Wave good bye with full range wrist flexion/extenision.  3.  count with fingers Ambulated back to room.  Pt recalled where his room was and the date.[name].  He perseverated the whole way back to his family on not going [facility] the [facility]  Pt. Recalled 3 LUE arm exercises given to him earlier.  Left pt in wc with safety belt on and call bell,phone within reach.   Family present.      Humberto Sealsdwards, Delmy Holdren J 03/27/2013, 5:16 PM

## 2013-03-28 ENCOUNTER — Inpatient Hospital Stay (HOSPITAL_COMMUNITY): Payer: Medicaid Other | Admitting: Speech Pathology

## 2013-03-28 ENCOUNTER — Inpatient Hospital Stay (HOSPITAL_COMMUNITY): Payer: Medicaid Other | Admitting: Occupational Therapy

## 2013-03-28 ENCOUNTER — Inpatient Hospital Stay (HOSPITAL_COMMUNITY): Payer: Medicaid Other | Admitting: Physical Therapy

## 2013-03-28 DIAGNOSIS — G8110 Spastic hemiplegia affecting unspecified side: Secondary | ICD-10-CM

## 2013-03-28 DIAGNOSIS — R209 Unspecified disturbances of skin sensation: Secondary | ICD-10-CM

## 2013-03-28 DIAGNOSIS — I69998 Other sequelae following unspecified cerebrovascular disease: Secondary | ICD-10-CM

## 2013-03-28 DIAGNOSIS — I619 Nontraumatic intracerebral hemorrhage, unspecified: Secondary | ICD-10-CM

## 2013-03-28 NOTE — Progress Notes (Signed)
Occupational Therapy Session Note  Patient Details  Name: Eric May MRN: [id number] Date of Birth: 05-Sep-1960  Today's Date: 03/28/2013 Time: [id number] Time Calculation (min): 45 min  Short Term Goals: No short term goals set  Skilled Therapeutic Interventions/Progress Updates:    Pt seen for ADL retraining with focus on increased participation in self-care tasks and functional use of LUE. Engaged in bathing in room shower at seated level with pt performing lateral leans to wash buttocks with close supervision. Stand pivot transfers to/from shower chair min assist with increased motor control and safety this session.  Dressing completed at sit > stand level with pt able to thread BLE in underwear and RLE in pants. Min assist sit > stand with pt maintaining standing balance with min/steady assist while pulling pants over hips. Forced use of LUE during bathing and when applying lotion to Rt arm with pt verbalizing frustration with decreased control. Discussed alternating bathing and dressing sessions with increased focus on LUE NM re-ed and vision, pt in agreement with plan.  Therapy Documentation Precautions:  Precautions Precautions: Fall Restrictions Weight Bearing Restrictions: No General:   Vital Signs: Therapy Vitals Pulse Rate: 82 BP: 125/76 mmHg Pain:  Pt with no c/o pain  See FIM for current functional status  Therapy/Group: Individual Therapy  [name], Eric May 03/28/2013, 12:13 PM

## 2013-03-28 NOTE — Progress Notes (Signed)
Recreational Therapy Session Note  Patient Details  Name: Eric May MRN: [id number] Date of Birth: 1960/09/17 Today's Date: 03/28/2013  Pain: no c/o Skilled Therapeutic Interventions/Progress Updates: Session focused on activity tolerance, functional ambulation with RW, dynamic balance, weight shifting, functional use of LUE, selective attention, problem solving & visual scanning.  Pt ambulated with RW from room to gym with min assist and then min-mod assist for return to room due to fatigue at the end of the session.  Pt performed tall kneeling while playing "black jack" card game using LUE as active assist/fine & gross motor with min cues.  Pt required min cues and up to min assist for balance & weight shifting.  Therapy/Group: Co-Treatment  Michael Walrath 03/28/2013, 3:37 PM

## 2013-03-28 NOTE — Progress Notes (Signed)
Speech Language Pathology Daily Session Note  Patient Details  Name: Eric May MRN: [id number] Date of Birth: 1960-07-31  Today's Date: 03/28/2013 Time: [id number] Time Calculation (min): 43 min  Short Term Goals: Week 4: SLP Short Term Goal 1 (Week 4): Patient will select attention to task for 10 minutes with Min clinician cues. SLP Short Term Goal 2 (Week 4): Patient will solve moderately complex problems with Mod verbal cues. SLP Short Term Goal 3 (Week 4): Patient will request help with Mod questions cues. SLP Short Term Goal 4 (Week 4): Patient will self-monitor and correct verbal perseveration with Max clinician cues.  Skilled Therapeutic Interventions: Skilled ST intervention provided with focus on cognitive goals. Pt in bed on side, c/o pain in buttocks upon slp entry.  Pt initially stated that he would not participate in therapy because he was in too much pain. Slp assisted pt with repositioning in bed to decrease pain. With max encouragement, pt agreed to limited tx activities. Pt recalled Left side attention deficit and recalled strategies, previously put in place to increase attention. Pt noted with decreased problem solving and safety awareness. Pt required min-mod verbal cues to utilize call bell appropriately to request assistance. Pt was 80% accurate with problem solving tasks, min assist provided.    FIM:  Comprehension Comprehension Mode: Auditory Comprehension: 5-Understands basic 90% of the time/requires cueing < 10% of the time Expression Expression Mode: Verbal Expression: 4-Expresses basic 75 - 89% of the time/requires cueing 10 - 24% of the time. Needs helper to occlude trach/needs to repeat words. Social Interaction Social Interaction: 3-Interacts appropriately 50 - 74% of the time - May be physically or verbally inappropriate. Problem Solving Problem Solving: 3-Solves basic 50 - 74% of the time/requires cueing 25 - 49% of the time  Pain Pain  Assessment Faces Pain Scale: Hurts little more Pain Type: Acute pain Pain Location: Buttocks Pain Intervention(s): RN made aware Multiple Pain Sites: No  Therapy/Group: Individual Therapy  Isaack Preble, Kara PacerLeah N 03/28/2013, 5:28 PM

## 2013-03-28 NOTE — Progress Notes (Signed)
53 y.o. right-handed male with history of hypertension, tobacco and alcohol abuse. Admitted 02/23/2013 when patient developed left-sided numbness and weakness while at work. Patient reports falling at that time due to weakness on the left. Blood pressure 194/117. CT of the head showed acute parenchymal hemorrhage over the right temporal parietal region measuring 4.9 x 3.2 cm with local mass effect and midline shift of 4 mm. Echocardiogram with ejection fraction 65% no valvular abnormalities. Carotid Dopplers with no ICA stenosis. MRI of the brain 02/24/2013 again showed intraparenchymal hematoma 5.2 x 3.7 x 3.3 cm with surrounding vasogenic edema. Neurosurgery Dr. Conchita ParisNundkumar consult advise conservative management with repeat serial cranial CT scans. Urine drug screen was positive for cocaine as well as marijuana. Maintained on Cardene drip for blood pressure control.  Diet advance to dysphagia 3 thin liquids 03/01/2013. Keppra for seizure prophylaxis  Subjective/Complaints: Watched game remembered some details Review of Systems -No chest pain or SOB, no abd or rectal pain Objective: Vital Signs: Blood pressure 128/81, pulse 88, temperature 98.4 F (36.9 C), temperature source Oral, resp. rate 19, height 6' (1.829 m), weight 59.8 kg (131 lb 13.4 oz), SpO2 100.00%. No results found. No results found for this or any previous visit (from the past 72 hour(s)).    Constitutional: He appears alert, no distress. Resting in bed quietly until I entered room HENT: poor dentition,  Head: Normocephalic. atraumatic Eyes:  Pupils EOMI, without nystagmus  Neck: Normal range of motion. Neck supple. No thyromegaly present.  Cardiovascular: Normal rate and regular rhythm. No murmurs  Respiratory: Effort normal and breath sounds normal. No respiratory distress. No wheezes, rales, rhonchi  GI: Soft. Bowel sounds are normal. He exhibits no distension.  Neurological:  . Oriented to name and hosp. He did follow one  and two step commands.[name]. 2-/5 left shoulder ,grip was 2-/5. LLE was 3-/5 with HF and KE and trace at the ankle.  Speech moderately dysarthric.  RUE 4/5 RLE 5/5 Skin: Skin is warm and dry.  Psychiatric:  Flat,  MSK:  - SLR    Assessment/Plan: 1. 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 Physiatrist is providing close team supervision and 24 hour management of active medical problems listed below. Physiatrist and rehab team continue to assess barriers to discharge/monitor patient progress toward functional and medical goals.  FIM: FIM - Bathing Bathing Steps Patient Completed: Chest;Left Arm;Abdomen;Front perineal area;Right upper leg;Left upper leg;Right Arm;Right lower leg (including foot);Left lower leg (including foot) Bathing: 4: Min-Patient completes 8-9 [id number] 10 parts or 75+ percent  FIM - Upper Body Dressing/Undressing Upper body dressing/undressing steps patient completed: Put head through opening of pull over shirt/dress;Thread/unthread right sleeve of pullover shirt/dresss;Pull shirt over trunk Upper body dressing/undressing: 4: Min-Patient completed 75 plus % of tasks FIM - Lower Body Dressing/Undressing Lower body dressing/undressing steps patient completed: Pull pants up/down Lower body dressing/undressing: 1: Total-Patient completed less than 25% of tasks  FIM - Toileting Toileting steps completed by patient: Adjust clothing prior to toileting;Performs perineal hygiene Toileting Assistive Devices: Grab bar or rail for support Toileting: 3: Mod-Patient completed 2 of 3 steps  FIM - Diplomatic Services operational officerToilet Transfers Toilet Transfers Assistive Devices: Grab bars Toilet Transfers: 4-From toilet/BSC: Min A (steadying Pt. > 75%);4-To toilet/BSC: Min A (steadying Pt. > 75%)  FIM - Bed/Chair Transfer Bed/Chair Transfer Assistive  Devices: Bed rails;HOB elevated Bed/Chair Transfer: 5: Chair or W/C >  Bed: Supervision (verbal cues/safety issues);5: Bed > Chair or W/C: Supervision (verbal cues/safety issues)  FIM - Locomotion: Wheelchair Distance: 150 Locomotion: Wheelchair: 1: Total Assistance/staff pushes wheelchair (Pt<25%) FIM - Locomotion: Ambulation Locomotion: Ambulation Assistive Devices: Orthosis;Walker - Rolling Ambulation/Gait Assistance: 4: Min assist Locomotion: Ambulation: 2: Travels 50 - 149 ft with minimal assistance (Pt.>75%)  Comprehension Comprehension Mode: Auditory Comprehension: 4-Understands basic 75 - 89% of the time/requires cueing 10 - 24% of the time  Expression Expression Mode: Verbal Expression: 3-Expresses basic 50 - 74% of the time/requires cueing 25 - 50% of the time. Needs to repeat parts of sentences.  Social Interaction Social Interaction: 3-Interacts appropriately 50 - 74% of the time - May be physically or verbally inappropriate.  Problem Solving Problem Solving: 2-Solves basic 25 - 49% of the time - needs direction more than half the time to initiate, plan or complete simple activities  Memory Memory: 3-Recognizes or recalls 50 - 74% of the time/requires cueing 25 - 49% of the time  Medical Problem List and Plan:  1. 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 DVT Prophylaxis/Anticoagulation: SCDs. Monitor for any signs of DVT  3. Pain Management: Tylenol as needed. 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 Neuropsych: This patient is not capable of making decisions on his own behalf.  5. Seizure prophylaxis. Keppra 500 mg twice a day. Monitor for any seizure activity  6. Hypertension. Norvasc 10 mg daily,D/C lisinopril 2.5, propranolol 10mg  TID will increase to 20mg                          7. Dysphagia.[name] dysphagia 3 thin  liquids. Followup speech therapy ,appetitie improved off  megace ,cont Remeron 8. History of tobacco alcohol abuse. Urine drug screen positive cocaine and marijuana.  9.  Rectal pain,secondary to impaction resolved, continence improving, stool OB - 10.  Mood- improved on remeron      LOS (Days) 26 A FACE TO FACE EVALUATION WAS PERFORMED  Ryley Teater E 03/28/2013, 8:07 AM

## 2013-03-28 NOTE — Progress Notes (Signed)
Physical Therapy Session Note  Patient Details  Name: Eric May MRN: [id number] Date of Birth: [birth year]  Today's Date: 03/28/2013 Time: 1003-1054 Time Calculation (min): 51 min  Short Term Goals: No short term goals set  Skilled Therapeutic Interventions/Progress Updates:   Co treat with recreation therapy.  Pt reports having good but slow weekend.  Pt agreeable to perform gait training to/from gym with RW and L Toe Off orthosis.  Performed gait x 150' x 2 reps with RW and orthosis for LUE and LLE with min A overall to the gym and min-mod to return to room secondary to fatigue.  Pt required verbal cues for full step length RLE and intermittent assistance for full L lateral weight shift in stance and facilitation of L anterior pelvic rotation to initiate swing.  Pt able to advance, place and stabilize LLE independently.  Returning to room pt demonstrated increased fatigue and increased R lateral lean with trunk and step to gait pattern.  Performed NMR with recreation therapy, see below for details.   Therapy Documentation Precautions:  Precautions Precautions: Fall Restrictions Weight Bearing Restrictions: No Vital Signs: Therapy Vitals Pulse Rate: 82 BP: 125/76 mmHg Pain: Pain Assessment Pain Assessment: No/denies pain Locomotion : Ambulation Ambulation/Gait Assistance: 4: Min assist  Other Treatments: Treatments Neuromuscular Facilitation: Left;Upper Extremity;Lower Extremity;Forced use;Activity to increase motor control;Activity to increase coordination;Activity to increase timing and sequencing;Activity to increase grading;Activity to increase sustained activation;Activity to increase lateral weight shifting;Activity to increase anterior-posterior weight shifting in tall kneeling on mat for prolonged period of time with focus on activation of proximal hip and core stabilizers with cues to maintain COG in midline with intermittent tactile cues to shift weight to LL while LUE  participated in card game with pt visually scanning and attending to cards on L and performing simple addition to add cards.  Began with bilat UE support on bench >> just LUE support on bench.  Pt began to c/o R knee pain and L flank pain.  Returned to sitting and rested prior to gait back to room.  Pain subsided with rest in sitting.    See FIM for current functional status  Therapy/Group: Individual Therapy and co-treat with Recreation therapy  Edman CircleHall, [name] [facility]aucette 03/28/2013, 12:19 PM

## 2013-03-29 ENCOUNTER — Inpatient Hospital Stay (HOSPITAL_COMMUNITY): Payer: Medicaid Other | Admitting: Occupational Therapy

## 2013-03-29 ENCOUNTER — Inpatient Hospital Stay (HOSPITAL_COMMUNITY): Payer: Medicaid Other | Admitting: Speech Pathology

## 2013-03-29 ENCOUNTER — Inpatient Hospital Stay (HOSPITAL_COMMUNITY): Payer: Medicaid Other | Admitting: Physical Therapy

## 2013-03-29 MED ORDER — SENNOSIDES-DOCUSATE SODIUM 8.6-50 MG PO TABS
2.0000 | ORAL_TABLET | Freq: Two times a day (BID) | ORAL | Status: DC
  Administered 2013-03-30 – 2013-04-14 (×29): 2 via ORAL
  Filled 2013-03-29 (×31): qty 2

## 2013-03-29 NOTE — Progress Notes (Signed)
Speech Language Pathology Daily Session Note  Patient Details  Name: Eric May MRN: [id number] Date of Birth: 03/24/1960  Today's Date: 03/29/2013 Time: 1055-1140 Time Calculation (min): 45 min  Short Term Goals: Week 4: SLP Short Term Goal 1 (Week 4): Patient will select attention to task for 10 minutes with Min clinician cues. SLP Short Term Goal 2 (Week 4): Patient will solve moderately complex problems with Mod verbal cues. SLP Short Term Goal 3 (Week 4): Patient will request help with Mod questions cues. SLP Short Term Goal 4 (Week 4): Patient will self-monitor and correct verbal perseveration with Max clinician cues.  Skilled Therapeutic Interventions: Skilled treatment session focused on addressing cognitive goals. SLP facilitated session with new learning task that required patietn to recall procedures with Min verbal cues.  SLP then chaged rules of task to address mental flexibility with Min vebral cues for recall of new information; SLP was able to fade cues to Supervision level.  Patient's functional flexibility with tasks appears to exceed verbal mental flexibility as patient was able to identify 1-2 solutions for vebral problems at end of session.  Patient continues to also exhibit poor safety awareness; continue with current plan of care.   FIM:  Comprehension Comprehension Mode: Auditory Comprehension: 5-Understands basic 90% of the time/requires cueing < 10% of the time Expression Expression Mode: Verbal Expression: 4-Expresses basic 75 - 89% of the time/requires cueing 10 - 24% of the time. Needs helper to occlude trach/needs to repeat words. Social Interaction Social Interaction: 4-Interacts appropriately 75 - 89% of the time - Needs redirection for appropriate language or to initiate interaction. Problem Solving Problem Solving: 3-Solves basic 50 - 74% of the time/requires cueing 25 - 49% of the time Memory Memory: 4-Recognizes or recalls 75 - 89% of the  time/requires cueing 10 - 24% of the time  Pain Pain Assessment Pain Assessment: No/denies pain Pain Score: 0-No pain  Therapy/Group: Individual Therapy  Charlane FerrettiMelissa Seraphine Gudiel, M.A., CCC-SLP [phone]  Eric May 03/29/2013, 12:09 PM

## 2013-03-29 NOTE — Progress Notes (Signed)
53 y.o. right-handed male with history of hypertension, tobacco and alcohol abuse. Admitted 02/23/2013 when patient developed left-sided numbness and weakness while at work. Patient reports falling at that time due to weakness on the left. Blood pressure 194/117. CT of the head showed acute parenchymal hemorrhage over the right temporal parietal region measuring 4.9 x 3.2 cm with local mass effect and midline shift of 4 mm. Echocardiogram with ejection fraction 65% no valvular abnormalities. Carotid Dopplers with no ICA stenosis.Urine drug screen was positive for cocaine as well as marijuana.   Keppra for seizure prophylaxis  Subjective/Complaints: Skin tear on buttocks from toilet transfer States girlfriend can care for him at home Review of Systems -No chest pain or SOB, no abd or rectal pain Objective: Vital Signs: Blood pressure 107/73, pulse 64, temperature 98.2 F (36.8 C), temperature source Oral, resp. rate 18, height 6' (1.829 m), weight 59.8 kg (131 lb 13.4 oz), SpO2 99.00%. No results found. No results found for this or any previous visit (from the past 72 hour(s)).    Constitutional: He appears alert, no distress. Resting in bed quietly until I entered room HENT: poor dentition,  Head: Normocephalic. atraumatic Eyes:  Pupils EOMI, without nystagmus  Neck: Normal range of motion. Neck supple. No thyromegaly present.  Cardiovascular: Normal rate and regular rhythm. No murmurs  Respiratory: Effort normal and breath sounds normal. No respiratory distress. No wheezes, rales, rhonchi  GI: Soft. Bowel sounds are normal. He exhibits no distension.  Neurological:  . Oriented to name and hosp. He did follow one and two step commands.[name]. 2-/5 left shoulder ,grip was 2-/5. LLE was 3-/5 with HF and KE and trace at the ankle.  Speech moderately dysarthric.  RUE 4/5 RLE 5/5 Skin: Skin is warm and dry.  Psychiatric:  Flat,  MSK:  - SLR    Assessment/Plan: 1. 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 Physiatrist is providing close team supervision and 24 hour management of active medical problems listed below. Physiatrist and rehab team continue to assess barriers to discharge/monitor patient progress toward functional and medical goals.  FIM: FIM - Bathing Bathing Steps Patient Completed: Chest;Left Arm;Abdomen;Front perineal area;Right upper leg;Left upper leg;Right Arm;Right lower leg (including foot);Left lower leg (including foot) Bathing: 5: Supervision: Safety issues/verbal cues  FIM - Upper Body Dressing/Undressing Upper body dressing/undressing steps patient completed: Put head through opening of pull over shirt/dress;Thread/unthread right sleeve of pullover shirt/dresss;Pull shirt over trunk;Thread/unthread left sleeve of pullover shirt/dress Upper body dressing/undressing: 5: Set-up assist to: Obtain clothing/put away FIM - Lower Body Dressing/Undressing Lower body dressing/undressing steps patient completed: Thread/unthread right underwear leg;Pull underwear up/down;Pull pants up/down Lower body dressing/undressing: 2: Max-Patient completed 25-49% of tasks  FIM - Toileting Toileting steps completed by patient: Adjust clothing prior to toileting;Performs perineal hygiene Toileting Assistive Devices: Grab bar or rail for support Toileting: 3: Mod-Patient completed 2 of 3 steps  FIM - Diplomatic Services operational officerToilet Transfers Toilet Transfers Assistive Devices: Grab bars Toilet Transfers: 4-From toilet/BSC: Min A (steadying Pt. > 75%);4-To toilet/BSC: Min A (steadying Pt. > 75%)  FIM - Bed/Chair Transfer Bed/Chair Transfer Assistive Devices: Walker;Orthosis Bed/Chair Transfer: 4: Chair or W/C > Bed: Min A (steadying Pt. > 75%);4: Bed > Chair or W/C: Min A (steadying Pt. > 75%)  FIM - Locomotion: Wheelchair Distance:  150 Locomotion: Wheelchair: 0: Activity did not occur FIM - Locomotion: Ambulation Locomotion: Ambulation  Assistive Devices: Orthosis;Walker - Rolling Ambulation/Gait Assistance: 4: Min assist Locomotion: Ambulation: 4: Travels 150 ft or more with minimal assistance (Pt.>75%)  Comprehension Comprehension Mode: Auditory Comprehension: 5-Understands basic 90% of the time/requires cueing < 10% of the time  Expression Expression Mode: Verbal Expression: 4-Expresses basic 75 - 89% of the time/requires cueing 10 - 24% of the time. Needs helper to occlude trach/needs to repeat words.  Social Interaction Social Interaction: 3-Interacts appropriately 50 - 74% of the time - May be physically or verbally inappropriate.  Problem Solving Problem Solving: 3-Solves basic 50 - 74% of the time/requires cueing 25 - 49% of the time  Memory Memory: 3-Recognizes or recalls 50 - 74% of the time/requires cueing 25 - 49% of the time  Medical Problem List and Plan:  1. 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 DVT Prophylaxis/Anticoagulation: SCDs. Monitor for any signs of DVT  3. Pain Management: Tylenol as needed. 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 Neuropsych: This patient is not capable of making decisions on his own behalf.  5. Seizure prophylaxis. Keppra 500 mg twice a day. Monitor for any seizure activity  6. Hypertension. Norvasc 10 mg daily,D/C lisinopril 2.5, propranolol 10mg  TID will increase to 20mg                               7. Dysphagia.[name] dysphagia 3 thin liquids. Followup speech therapy ,appetitie improved off  megace ,cont Remeron 8. History of tobacco alcohol abuse. Urine drug screen positive cocaine and marijuana.  9.  Rectal pain,skin tear from transfer, local care per RN 10.  Mood- improved on remeron      LOS (Days) 27 A  FACE TO FACE EVALUATION WAS PERFORMED  KIRSTEINS,ANDREW E 03/29/2013, 6:46 AM

## 2013-03-29 NOTE — Plan of Care (Signed)
Problem: RH BLADDER ELIMINATION Goal: RH STG MANAGE BLADDER WITH ASSISTANCE STG Manage Bladder With min assist  Outcome: Progressing Wears condom cath. At night.

## 2013-03-29 NOTE — Progress Notes (Signed)
Occupational Therapy Session Note  Patient Details  Name: Eric May MRN: [id number] Date of Birth: [birth year]  Today's Date: 03/29/2013 Time: 0850-0920 Time Calculation (min): 30 min  Short Term Goals: No short term goals set  Skilled Therapeutic Interventions/Progress Updates:    Pt seen for ADL retraining with focus on increased use of LUE during dressing and grooming tasks.  Pt initially refused treatment reporting he did not want to be rushed and needed to finish breakfast.  Upon return pt reports need to complete grooming and dressing prior to participating in further treatment.  Discussed with pt purpose of occupational therapy and goal to focus on vision and LUE use as well. Pt frustrated throughout session, reporting he always feels rushed.  Pt able to thread Rt pant leg and underwear as well as pull both over hips with min/steady assist to maintain standing balance.    Therapy Documentation Precautions:  Precautions Precautions: Fall Restrictions Weight Bearing Restrictions: No General: General Amount of Missed OT Time (min): 15 Minutes Vital Signs: Therapy Vitals Pulse Rate: 70 BP: 118/77 mmHg Pain: Pain Assessment Pain Assessment: 0-10 Pain Score: 0-No pain  See FIM for current functional status  Therapy/Group: Individual Therapy  Kelse Ploch 03/29/2013, 11:02 AM

## 2013-03-29 NOTE — Progress Notes (Signed)
Physical Therapy Session Note  Patient Details  Name: Eric May MRN: [id number] Date of Birth: [birth year]  Today's Date: 03/29/2013 Time: [id number] Time Calculation (min): 49 min  Short Term Goals: No short term goals set  Skilled Therapeutic Interventions/Progress Updates:   Pt family present.  Pt smiling and in very pleasant mood and ready to participate in therapy.  Pt performed sit > stand from recliner with supervision and perform gait beginning with RW and L AFO with min A; able to remove hand orthosis today secondary to pt attention to LUE and ability to maintain grip improving.  Part of the way to the gym, removed RW and performed gait with LUE around therapist only to minimize dependence on UE for stability/balance increased L lateral weight shift, LLE WB and activation of LLE and proximal stabilizers x 75' with min-mod A; pt still presents with step to gait sequence.  In gym had discussion with pt about fall risk following CVA.  Pt feels he would not be able to get himself off the floor if he fell; pt agreeable to learn how to perform floor > furniture transfer.  Demonstrated floor > furniture sequence and discussed indications for calling EMS.  Pt transferred to floor with +2 A; pt return demonstrated floor > furniture transfer with mod verbal cues and min A for stability in half kneeling and to push to stand.  Pt tolerated well and was grateful to learn how to get up off the floor but is still fearful of falling.  Continued gait training with dual task of basketball toss to also work on use of LUE, alternating attention and dynamic balance during gait.  Performed gait with tosses x 30' with mod-max A of therapist behind pt for balance and lateral weight shifting; pt had to cease ambulating to focus on tossing or catching ball.  Required rest break.  Returned to room with RW min A.  Pt requesting to urinate at toilet in standing.  Transferred into bathroom with min A and pt able to stand  in front of toilet with LUE support on grab bar, and manage jean button/zipper without assistance for doffing and donning.  Pt required min A to maintain balance.  Transferred toilet > standing at sink to wash hands > recliner with min A.  Pt continued to verbalize appreciation to staff for continuing to work with him and progress him despite outbursts during first few weeks on rehab.      Therapy Documentation Vital Signs: Therapy Vitals Temp: 98.1 F (36.7 C) Temp src: Oral Pulse Rate: 77 Resp: 18 BP: 128/90 mmHg Patient Position, if appropriate: Sitting Oxygen Therapy SpO2: 100 % O2 Device: None (Room air) Pain:  No c/o pain Locomotion : Ambulation Ambulation/Gait Assistance: 4: Min assist   See FIM for current functional status  Therapy/Group: Individual Therapy and Co-Treatment  Edman CircleHall, [name] [facility] 03/29/2013, 4:48 PM

## 2013-03-29 NOTE — Progress Notes (Addendum)
Recreational Therapy Session Note  Patient Details  Name: Eric May MRN: [id number] Date of Birth: [birth year] Today's Date: 03/29/2013  Pain: no c/o Skilled Therapeutic Interventions/Progress Updates: Session focused on activity tolerance, ambulation with RW, dynamic standing balance & ambulation without UE support, LUE use.  Pt ambulated with RW to gym with min assist.  Pt ambulated without AD while tossing/catching a basketball using BUE with mod-max assist.  Pt expressed appreciation for therapists working with him to get stronger and shared that he was motivated to continue working towards greater independence and discharge home with follow up home health therapies. Therapy/Group: Co-Treatment  Nichelle Renwick 03/29/2013, 4:49 PM

## 2013-03-30 ENCOUNTER — Inpatient Hospital Stay (HOSPITAL_COMMUNITY): Payer: Medicaid Other | Admitting: Speech Pathology

## 2013-03-30 ENCOUNTER — Inpatient Hospital Stay (HOSPITAL_COMMUNITY): Payer: Medicaid Other | Admitting: Occupational Therapy

## 2013-03-30 ENCOUNTER — Inpatient Hospital Stay (HOSPITAL_COMMUNITY): Payer: Medicaid Other | Admitting: Physical Therapy

## 2013-03-30 NOTE — Progress Notes (Signed)
Speech Language Pathology Daily Session Note  Patient Details  Name: Eric May MRN: [id number] Date of Birth: 03-Dec-1960  Today's Date: 03/30/2013 Time: 1330-1415 Time Calculation (min): 45 min  Short Term Goals: Week 4: SLP Short Term Goal 1 (Week 4): Patient will select attention to task for 10 minutes with Min clinician cues. SLP Short Term Goal 2 (Week 4): Patient will solve moderately complex problems with Mod verbal cues. SLP Short Term Goal 3 (Week 4): Patient will request help with Mod questions cues. SLP Short Term Goal 4 (Week 4): Patient will self-monitor and correct verbal perseveration with Max clinician cues.  Skilled Therapeutic Interventions: Skilled treatment session focused on addressing education and cognitive goals. SLP facilitated session with discussion and demonstration of scanning techniques to assist patient with locating items on left of food tray.  Girlfriend preset for session, observed and asked appropriate questions.  SLP also facilitated session with new learning task that required patient to recall procedures with increase wait time and Mod faded to Min verbal cues.     FIM:  Comprehension Comprehension Mode: Auditory Comprehension: 5-Understands basic 90% of the time/requires cueing < 10% of the time Expression Expression Mode: Verbal Expression: 4-Expresses basic 75 - 89% of the time/requires cueing 10 - 24% of the time. Needs helper to occlude trach/needs to repeat words. Social Interaction Social Interaction: 4-Interacts appropriately 75 - 89% of the time - Needs redirection for appropriate language or to initiate interaction. Problem Solving Problem Solving: 3-Solves basic 50 - 74% of the time/requires cueing 25 - 49% of the time Memory Memory: 4-Recognizes or recalls 75 - 89% of the time/requires cueing 10 - 24% of the time  Pain Pain Assessment Pain Assessment: No/denies pain  Therapy/Group: Individual Therapy  Charlane FerrettiMelissa Correne Lalani, M.A.,  CCC-SLP [phone]  [name] 03/30/2013, 4:30 PM

## 2013-03-30 NOTE — Progress Notes (Signed)
Occupational Therapy Session Note  Patient Details  Name: Eric May MRN: [id number] Date of Birth: 02/08/1961  Today's Date: 03/30/2013 Time: 0935-1020 Time Calculation (min): 45 min  Short Term Goals: No short term goals set  Skilled Therapeutic Interventions/Progress Updates:    Engaged in ADL retraining with focus on sit <> stand, standing balance, and functional use of LUE with bathing and dressing.  Pt requested to wash at sink this session.  Pt with increased use of LUE with bathing and applying lotion, while reporting frustration with decreased coordination and it having a "mind of it's own".  Encouraged pt to utilize Lt hand when threading pant leg and pulling pants over hips.  Min assist with standing this session with pt demonstrating increased ability to pull pants over hips and fasten button and belt with min/steady assist.  Pt verbalizing appreciation to staff for continuing to work with him and progress him despite outbursts during first few weeks on rehab.   Therapy Documentation Precautions:  Precautions Precautions: Fall Restrictions Weight Bearing Restrictions: No Pain: Pain Assessment Pain Assessment: 0-10 Pain Score: 5  Pain Type: Acute pain Pain Location: Coccyx Pain Descriptors / Indicators: Aching Pain Frequency: Occasional Pain Onset: Gradual Patients Stated Pain Goal: 2 Pain Intervention(s): Medication (See eMAR)  See FIM for current functional status  Therapy/Group: Individual Therapy  [name], Naomy Esham 03/30/2013, 11:24 AM

## 2013-03-30 NOTE — Progress Notes (Signed)
Recreational Therapy Session Note  Patient Details  Name: Gerre Pebblesnthony Chenoweth MRN: [id number] Date of Birth: 03/07/1960 Today's Date: 03/30/2013 Pt participated in animal assisted activity/therapy seated in recliner using LUE to pet the dog with min cues/supervision. Bryant Lipps 03/30/2013, 4:43 PM

## 2013-03-30 NOTE — Progress Notes (Signed)
Physical Therapy Weekly Progress Note  Patient Details  Name: Eric May MRN: [id number] Date of Birth: Jun 22, 1960  Today's Date: 03/30/2013 Time: 7482-7078 Time Calculation (min): 41 min  Patient has met 4 of 10 long term goals.  Short term goals not set.  Pt is making good progress towards LTG and is now supervision bed mobility, bed <> w/c transfers, w/c mobility and has progressed to min A gait with orthosis and RW, min-mod A floor transfers, and stair negotiation.  Pt demonstrating improved activity tolerance with decreased frustration and improved motivation to participate.  Pt verbalizing that girlfriend has expressed desire to take pt home from hospital instead of SNF.  Will continue to discuss with pt, girlfriend and team.  Car transfer and home ambulation goals added.  Controlled gait and stair goals upgraded secondary to progress.    Patient continues to demonstrate the following deficits: L hemiparesis and absent sensation and proprioception, L sided inattention, impaired motor control, timing, sequencing, impaired balance, gait, and therefore will continue to benefit from skilled PT intervention to enhance overall performance with balance, postural control, ability to compensate for deficits, functional use of  left upper extremity and left lower extremity, attention, awareness and coordination.  See Patient's Care Plan for progression toward long term goals.  Patient progressing toward long term goals..  Plan of care revisions: car transfer and home ambulation goals added, controlled gait and stair goals upgraded due to progress.  Skilled Therapeutic Interventions/Progress Updates:   Co treat with recreation therapy.  Pt perseverating on weight loss, pain in buttocks after hitting buttocks on arm rest of recliner.  Offered pt emotional support and encouraged pt that as his appetite and activity increase he will likely experience some weight gain and mm mass.  Performed gait x 150' x  2 without AD but with LUE around therapist and therapist providing min tactile cues at R trunk for elongation and L lateral weight shift.  Also performed stair negotiation training up/down 5 stairs x 2 reps with bilat rails with min A with verbal cues for safe step to sequence with improved attention to and grip through LUE on rail.  During seated rest breaks focused on use of LUE to assist with opening bottle of water and discussed possible outing tomorrow.  Returned to room and set up in recliner with all items within reach.  Therapy Documentation Precautions:  Precautions Precautions: Fall Restrictions Weight Bearing Restrictions: No Pain: Pain Assessment Pain Assessment: 0-10 Pain Score: 4  Pain Type: Acute pain Pain Location: Leg Pain Orientation: Left Pain Descriptors / Indicators: Sore Pain Frequency: Occasional Pain Onset: Gradual Patients Stated Pain Goal: 2 Pain Intervention(s): Other (Comment) (premedicated) Locomotion : Ambulation Ambulation/Gait Assistance: 4: Min assist;3: Mod assist   See FIM for current functional status  Therapy/Group: Individual Therapy and Co-Treatment  Raylene Everts Faucette 03/30/2013, 11:45 AM

## 2013-03-30 NOTE — Patient Care Conference (Signed)
Inpatient RehabilitationTeam Conference and Plan of Care Update Date: 03/30/2013   Time: 11:55 AM    Patient Name: Eric May      Medical Record Number: [id number]  Date of Birth: September 14, 1960 Sex: Male         Room/Bed: 4W11C/4W11C-01 Payor Info: Payor: MEDICAID PENDING / Plan: MEDICAID PENDING / Product Type: *No Product type* /    Admitting Diagnosis: ICH  Admit Date/Time:  03/02/2013  6:21 PM Admission Comments: No comment available   Primary Diagnosis:  <principal problem not specified> Principal Problem: <principal problem not specified>  Patient Active Problem List   Diagnosis Date Noted  . ICH (intracerebral hemorrhage) 03/02/2013  . Convulsions/seizures 02/24/2013  . Intracerebral hemorrhage 02/23/2013    Expected Discharge Date:    Team Members Present: Physician leading conference: Dr. Claudette Laws Social Worker Present: Staci Acosta, LCSW;Becky Sequoyah Ramone, LCSW Nurse Present: Other (comment) Kennyth Arnold lemmings-RN) PT Present: Edman Circle, PT;Emily Marya Amsler, PT OT Present: Rosalio Loud, OT;Kris Elise Benne, OT SLP Present: Fae Pippin, SLP PPS Coordinator present : Tora Duck, RN, CRRN     Current Status/Progress Goal Weekly Team Focus  Medical   skin tear improve, coccydynia from transfer  maintain stability  D/C planning   Bowel/Bladder   continent of bowel and bladder  continent of bowel and bladder; using urinal only at HS  Urinal at HS, offer assistance with use of urinal during day or up to bathroom   Swallow/Nutrition/ Hydration     Regular diet        ADL's   min assist toilet and shower transfers, supervision bathing and UB dressing, max assist LB dressing. LUE as gross assist  min assist overall, mod assist LB dressing and toileting  participation in treatement session, LUE NM re-ed, self-care retraining   Mobility   Supervision transfers and w/c mobility, min A gait with RW, mod A stairs  Goals upgraded; supervision transfers and w/c  mobility, min A gait, floor transfer  Use of LUE during mobility, gait, LLE strengthening and balance   Communication   Min assist-Supervision   goals upgraded to supervision   continue to increase awareness of perseveration, self-monitoring and correcting    Safety/Cognition/ Behavioral Observations  Mod-Min assist   Min assist   increase left attention, awreness, recall and oveall safety    Pain   pain managed with tylenol and ultram  pain managed with prn meds; able to participate in ADLS  assess effectiveness and need for prn meds   Skin   small healed slit at sacrum  no new skin breakdown or infection while on rehab  assess skin q shift      *See Care Plan and progress notes for long and short-term goals.  Barriers to Discharge: no funding for SNF currently    Possible Resolutions to Barriers:  SNF vs home     Discharge Planning/Teaching Needs:  Pt making good progress, trying to get [name]-Girlfreind to take pt home.  Looking for NH bed      Team Discussion:  Pt participating more in therapies, making progress he can see.  Outing tomorrow.  AFO is helping  Revisions to Treatment Plan:  NHP-upgraded goals-min ambulation   Continued Need for Acute Rehabilitation Level of Care: The patient requires daily medical management by a physician with specialized training in physical medicine and rehabilitation for the following conditions: Daily direction of a multidisciplinary physical rehabilitation program to ensure safe treatment while eliciting the highest outcome that is of practical value  to the patient.: Yes Daily medical management of patient stability for increased activity during participation in an intensive rehabilitation regime.: Yes Daily analysis of laboratory values and/or radiology reports with any subsequent need for medication adjustment of medical intervention for : Neurological problems  Eric May, Eric LivingsRebecca May 03/31/2013, 8:58 AM

## 2013-03-30 NOTE — Progress Notes (Signed)
Recreational Therapy Session Note  Patient Details  Name: Gerre Pebblesnthony Musolf MRN: [id number] Date of Birth: [birth year] Today's Date: 03/30/2013  Pain: c/o soreness Skilled Therapeutic Interventions/Progress Updates: Pt perseverating on weight loss, pain in buttocks after hitting buttocks on arm rest of recliner. Offered pt emotional support and encouraged pt that as his appetite and activity increase he will likely experience some weight gain and increased mass. Pt ambulated to and from room/gym without AD with min assist & LUE around PT's neck.  During seated rest breaks focused on use of LUE to assist with opening bottle of water and discussed potential lunch outing tomorrow.  Pt agreeable to participate. Therapy/Group: Co-Treatment   Marjie Chea 03/30/2013, 12:45 PM

## 2013-03-30 NOTE — Progress Notes (Signed)
53 y.o. right-handed male with history of hypertension, tobacco and alcohol abuse. Admitted 02/23/2013 when patient developed left-sided numbness and weakness while at work. Patient reports falling at that time due to weakness on the left. Subjective/Complaints: Slept well Review of Systems -No chest pain or SOB, no abd or rectal pain Objective: Vital Signs: Blood pressure 124/86, pulse 68, temperature 97.2 F (36.2 C), temperature source Oral, resp. rate 16, height 6' (1.829 m), weight 59.8 kg (131 lb 13.4 oz), SpO2 99.00%. No results found. No results found for this or any previous visit (from the past 72 hour(s)).    Constitutional: He appears alert, no distress. Resting in bed quietly until I entered room HENT: poor dentition,  Head: Normocephalic. atraumatic Eyes:  Pupils EOMI, without nystagmus  Neck: Normal range of motion. Neck supple. No thyromegaly present.  Cardiovascular: Normal rate and regular rhythm. No murmurs  Respiratory: Effort normal and breath sounds normal. No respiratory distress. No wheezes, rales, rhonchi  GI: Soft. Bowel sounds are normal. He exhibits no distension.  Neurological:  . Oriented to name and hosp. He did follow one and two step commands.[name] 2-/5 left shoulder ,grip was 2-/5. LLE was 3-/5 with HF and KE and trace at the ankle.  Speech moderately dysarthric.  RUE 4/5 RLE 5/5 Skin: Skin is warm and dry.  Psychiatric:  Flat,  MSK:  - SLR    Assessment/Plan: 1. 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 Physiatrist is providing close team supervision and 24 hour management of active medical problems listed below. Physiatrist and rehab team continue to assess barriers to discharge/monitor patient progress toward functional and medical goals. Team conference today please see physician  documentation under team conference tab, met with team face-to-face to discuss problems,progress, and goals. Formulized individual treatment plan based on medical history, underlying problem and comorbidities. FIM: FIM - Bathing Bathing Steps Patient Completed: Chest;Left Arm;Abdomen;Front perineal area;Right upper leg;Left upper leg;Right Arm;Right lower leg (including foot);Left lower leg (including foot) Bathing: 5: Supervision: Safety issues/verbal cues  FIM - Upper Body Dressing/Undressing Upper body dressing/undressing steps patient completed: Put head through opening of pull over shirt/dress;Thread/unthread right sleeve of pullover shirt/dresss;Pull shirt over trunk;Thread/unthread left sleeve of pullover shirt/dress Upper body dressing/undressing: 5: Set-up assist to: Obtain clothing/put away FIM - Lower Body Dressing/Undressing Lower body dressing/undressing steps patient completed: Thread/unthread right underwear leg;Pull underwear up/down;Pull pants up/down;Thread/unthread right pants leg Lower body dressing/undressing: 2: Max-Patient completed 25-49% of tasks  FIM - Toileting Toileting steps completed by patient: Adjust clothing prior to toileting;Performs perineal hygiene;Adjust clothing after toileting Toileting Assistive Devices: Grab bar or rail for support Toileting: 4: Steadying assist  FIM - Radio producer Devices: Grab bars Toilet Transfers: 4-To toilet/BSC: Min A (steadying Pt. > 75%);4-From toilet/BSC: Min A (steadying Pt. > 75%)  FIM - Bed/Chair Transfer Bed/Chair Transfer Assistive Devices: Walker;Orthosis Bed/Chair Transfer: 5: Chair or W/C > Bed: Supervision (verbal cues/safety issues);5: Bed > Chair or W/C: Supervision (verbal cues/safety issues)  FIM - Locomotion: Wheelchair Distance: 150 Locomotion: Wheelchair: 0: Activity did not occur FIM - Locomotion: Ambulation Locomotion: Ambulation Assistive Devices: Orthosis;Walker -  Rolling Ambulation/Gait Assistance: 4: Min assist Locomotion: Ambulation: 4: Travels 150 ft or more with minimal assistance (Pt.>75%)  Comprehension Comprehension Mode: Auditory Comprehension: 5-Understands basic 90% of the time/requires cueing < 10% of the time  Expression Expression Mode: Verbal Expression: 4-Expresses  basic 75 - 89% of the time/requires cueing 10 - 24% of the time. Needs helper to occlude trach/needs to repeat words.  Social Interaction Social Interaction: 4-Interacts appropriately 75 - 89% of the time - Needs redirection for appropriate language or to initiate interaction.  Problem Solving Problem Solving: 3-Solves basic 50 - 74% of the time/requires cueing 25 - 49% of the time  Memory Memory: 4-Recognizes or recalls 75 - 89% of the time/requires cueing 10 - 24% of the time  Medical Problem List and Plan:  1. 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 DVT Prophylaxis/Anticoagulation: SCDs. Monitor for any signs of DVT  3. Pain Management: Tylenol as needed. 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 Neuropsych: This patient is not capable of making decisions on his own behalf.  5. Seizure prophylaxis. Keppra 500 mg twice a day. Monitor for any seizure activity  6. Hypertension. Norvasc 10 mg daily,D/C lisinopril 2.5, propranolol 28m TID will increase to 2100m                             7. Dysphagia.. Marland Kitchenysphagia 3 thin liquids. Followup speech therapy ,appetitie improved off  megace ,cont Remeron 8. History of tobacco alcohol abuse. Urine drug screen positive cocaine and marijuana.  9.  Rectal pain,skin tear from transfer, local care per RN 10.  Mood- improved on remeron      LOS (Days) 28 A FACE TO FACE EVALUATION WAS PERFORMED  KIRSTEINS,ANDREW E 03/30/2013, 8:12 AM

## 2013-03-30 NOTE — Progress Notes (Signed)
NUTRITION FOLLOW UP  Intervention:   1.  General healthful diet; encourage intake of foods and beverages as able.  RD to follow and assess for nutritional adequacy.  2.  Resource Breeze po TID, each supplement provides 250 kcal and 9 grams of protein  NUTRITION DIAGNOSIS:  Inadequate oral intake related to poor appetite as evidenced by PO intake variable at meals- progressing  Monitor:  1. Food/Beverage; pt meeting >/=90% estimated needs with tolerance - met  2. Wt/wt change; monitor trends - pt's weight down 1 pound since admission   Assessment:   Pt admitted with deconditioning r/t right temporoparietal ICH.  Pt's intake remains 75-100% of his meals.  Pt loves Peach and Arabella Merles and reports drinking 2-3 per day.  Despite overall improved PO intake, pt continues to lose wt now 129 lbs from ~2 weeks ago which is likely related to variability in intake and size of meals.  Reviewed menu with pt and fiancee at bedside.  Celesta Gentile states she has been bringing some foods for pt.  Pt is extremely picky and is tiring of ordering the same foods.  He is also particular with certain foods- did not eat rotini with sauce today because he was expecting spaghetti even though both are pasta with marinara. Pt needs help ordering meals due to frustration.  Began reviewing menu specials with pt and marking through items he did not like and circling items he does so that we can keep track and narrow down his likes/dislikes.    Height: Ht Readings from Last 1 Encounters:  03/02/13 6' (1.829 m)    Weight Status:   Wt Readings from Last 1 Encounters:  03/30/13 129 lb (58.514 kg)  Admit wt:        132 lb 7.9 oz (60.1 kg)  Re-estimated needs:  Kcal: 2100-2280  Protein: 90-110g  Fluid: >2.1 L/day  Skin: intact  Diet Order: General   Intake/Output Summary (Last 24 hours) at 03/30/13 1433 Last data filed at 03/30/13 1200  Gross per 24 hour  Intake    720 ml  Output   2150 ml  Net  -1430 ml     Last BM: 2/3  Labs:  No results found for this basename: NA, K, CL, CO2, BUN, CREATININE, CALCIUM, MG, PHOS, GLUCOSE,  in the last 168 hours  CBG (last 3)  No results found for this basename: GLUCAP,  in the last 72 hours  Scheduled Meds: . amLODipine  10 mg Oral Daily  . antiseptic oral rinse  15 mL Mouth Rinse BID  . feeding supplement (RESOURCE BREEZE)  1 Container Oral TID BM  . folic acid  1 mg Oral Daily  . gabapentin  100 mg Oral TID  . levETIRAcetam  500 mg Oral BID  . mirtazapine  15 mg Oral QHS  . multivitamin with minerals  1 tablet Oral Daily  . nicotine  21 mg Transdermal Daily  . pantoprazole  40 mg Oral Daily  . potassium chloride  20 mEq Oral Daily  . propranolol  20 mg Oral TID  . QUEtiapine  50 mg Oral QHS  . senna-docusate  2 tablet Oral BID  . sorbitol, milk of mag, mineral oil, glycerin (SMOG) enema  960 mL Rectal Once  . thiamine  100 mg Oral Daily    Brynda Greathouse, MS RD LDN Clinical Inpatient Dietitian Pager: [phone] Weekend/After hours pager: [phone]

## 2013-03-31 ENCOUNTER — Inpatient Hospital Stay (HOSPITAL_COMMUNITY): Payer: Medicaid Other | Admitting: Occupational Therapy

## 2013-03-31 ENCOUNTER — Encounter (HOSPITAL_COMMUNITY): Payer: Self-pay | Admitting: Occupational Therapy

## 2013-03-31 ENCOUNTER — Inpatient Hospital Stay (HOSPITAL_COMMUNITY): Payer: Medicaid Other | Admitting: Speech Pathology

## 2013-03-31 NOTE — Progress Notes (Signed)
Occupational Therapy Session Note  Patient Details  Name: Eric May MRN: [id number] Date of Birth: 15-Oct-1960  Today's Date: 03/31/2013 Time: [id number] Time Calculation (min): 45 min  Short Term Goals: No short term goals set  Skilled Therapeutic Interventions/Progress Updates:    Engaged in ADL retraining with focus on functional mobility and transfers with RW and functional use of LUE during bathing and dressing.  Ambulated with RW with Lt hand orthosis into bathroom to walk-in shower with min assist with min cues for LLE advancement.  Completed bathing at seated level in shower with pt completing lateral leans to wash buttocks.  Increased functional use of LUE with washing and hand over hand to assist in pulling up pants with Lt hand.  Min/steady assist in standing with pt not fully WB through LLE.  Pt very pleasant this session and reports trying to focus on positives and increase participation.  Therapy Documentation Precautions:  Precautions Precautions: Fall Restrictions Weight Bearing Restrictions: No Pain:  Pt with no c/o pain this session.  See FIM for current functional status  Therapy/Group: Individual Therapy  [name], Norell Brisbin 03/31/2013, 10:18 AM

## 2013-03-31 NOTE — Progress Notes (Signed)
Recreational Therapy Session Note  Patient Details  Name: Eric May MRN: [id number] Date of Birth: 1961-01-29 Today's Date: 03/31/2013 Time:1100-1300  Pain:  No c/o  Pt participated in community reintegration/outing to [employer] with focus on functional mobility, dynamic standing balance, LUE use, scanning to/attending to the left, & overall safety.  Pt ambulated on even/uneven community indoor/outdoor surfaces using RW with close supervision-min assist.  Pt required min cues only for encouragement of  LUE use during meal.  See outing goal sheet in shadow chart for further details.  Therapy/Group: [employer]  Katalea Ucci 03/31/2013, 1:35 PM

## 2013-03-31 NOTE — Progress Notes (Signed)
Speech Language Pathology Weekly Progress and Session Note  Patient Details  Name: Kizer Nobbe MRN: [id number] Date of Birth: 06-Dec-1960  Today's Date: 03/31/2013 Time: 1020-1100 Time Calculation (min): 40 min  Short Term Goals: Week 4: SLP Short Term Goal 1 (Week 4): Patient will select attention to task for 10 minutes with Min clinician cues. SLP Short Term Goal 1 - Progress (Week 4): Progressing toward goal SLP Short Term Goal 2 (Week 4): Patient will solve moderately complex problems with Mod verbal cues. SLP Short Term Goal 2 - Progress (Week 4): Met SLP Short Term Goal 3 (Week 4): Patient will request help with Mod questions cues. SLP Short Term Goal 3 - Progress (Week 4): Met SLP Short Term Goal 4 (Week 4): Patient will self-monitor and correct verbal perseveration with Max clinician cues. SLP Short Term Goal 4 - Progress (Week 4): Met  Week 5: SLP Short Term Goal 1 (Week 5): Patient will select attention to task for 10 minutes with Min clinician cues. SLP Short Term Goal 2 (Week 5): Patient will solve moderately complex problems with Min verbal cues. SLP Short Term Goal 3 (Week 5): Patient will anticipate needs for discharge with Min question cues SLP Short Term Goal 4 (Week 5): Patient will self-monitor and correct verbal perseveration with Supervision level verbal cues.   Weekly Progress Updates: Patient met 3 out of 4 short terms goals during this reporting period due to gains in emergent awareness, moderately complex problem solving and awareness of verbal perseverations.  Patient is now in room, up in chair without the use of a quick release belt and has been making safer decisions.  Patient's selective attention appears to impact function and in a distracting environment he requires Mod cues.  Additionally, this impacts his ability to follow directions and learn new information.  He continues to demonstrate moderate cognitive deficits characterized by poor anticipatory  awareness, self-monitoring and correcting which impact his ability to safely care for himself outside of a highly structured setting.  As a result, patient continues to require skilled SLP services to address his cognitive-linguistic abilities and safety with self-care tasks which will reduce burden of care prior to discharge.   Intensity: Minumum of 1-2 x/day, 30 to 90 minutes Frequency: 5 out of 7 days Duration/Length of Stay: SNF Treatment/Interventions: Cognitive remediation/compensation;Cueing hierarchy;Environmental controls;Functional tasks;Internal/external aids;Patient/family education;Therapeutic Activities   Daily Session  Clinical Impression:  Skilled treatment session focused on addressing cognitive goals. SLP facilitated session with requests to anticipate needs involved in getting ready for lunch outing.  Patient requires Max cues to anticipate needs, but Min cues to problem solve completion of upper body dressing and toileting.  SLP also facilitated session with discussion regarding purpose of outing to reinforce strategies taught and addressed in therapy such as scanning left and making safe decisions; patient verbalized understanding.            FIM:  Comprehension Comprehension Mode: Auditory Comprehension: 5-Understands basic 90% of the time/requires cueing < 10% of the time Expression Expression Mode: Verbal Expression: 5-Expresses basic 90% of the time/requires cueing < 10% of the time. Social Interaction Social Interaction: 5-Interacts appropriately 90% of the time - Needs monitoring or encouragement for participation or interaction. Problem Solving Problem Solving: 4-Solves basic 75 - 89% of the time/requires cueing 10 - 24% of the time Memory Memory: 5-Recognizes or recalls 90% of the time/requires cueing < 10% of the time General  Amount of Missed SLP Time (min): 5 Minutes Pain Pain Assessment Pain  Assessment: No/denies pain  Therapy/Group: Individual  Therapy  Carmelia Roller., Ryan [phone]  [place] 03/31/2013, 2:25 PM

## 2013-03-31 NOTE — Progress Notes (Signed)
Occupational Therapy Session Note  Patient Details  Name: Eric May MRN: [id number] Date of Birth: May 21, 1960  Today's Date: 03/31/2013 Time: 1100-1300 Time Calculation (min): 120 min  Skilled Therapeutic Interventions/Progress Updates:    Pt participated in community outing to a restaurant.  He was able to transfer on and off of the [place] with min assist and mod instructional cueing for sequencing of transfer.  Pt also able to verbalize which LE goes first when getting down out of the [place].  Ambulated around the restaurant to his seat using the RW with hand splint attached to the left side and min guard assist.  Pt with no noted episodes of running into any objects on the left side.  Pt was able to utilize the LUE for self feeding 10% of session with holding roll and holding cup for drinking.  He needed assistance with gathering his food since he was using the RW so therapist set him up with food he asked for.  He was able to voice that he needs to turn his head to the left to locate all items on his plate because "I can't see on my left too good.".  Pt voiced having an enjoyable time being out but that he was a little nervous.     Therapy Documentation Precautions:  Precautions Precautions: Fall Precaution Comments: pt con't to be agitated at onset of pain Restrictions Weight Bearing Restrictions: No  Pain: Pain Assessment Pain Assessment: No/denies pain  See FIM for current functional status  Therapy/Group: Individual Therapy  Xzavian Semmel OTR/L 03/31/2013, 1:51 PM

## 2013-03-31 NOTE — Progress Notes (Signed)
53 y.o. right-handed male with history of hypertension, tobacco and alcohol abuse. Admitted 02/23/2013 when patient developed left-sided numbness and weakness while at work. Patient reports falling at that time due to weakness on the left. Subjective/Complaints: Slept well Review of Systems -No chest pain or SOB, no abd or rectal pain Objective: Vital Signs: Blood pressure 121/82, pulse 77, temperature 98.3 F (36.8 C), temperature source Oral, resp. rate 17, height 6' (1.829 m), weight 58.514 kg (129 lb), SpO2 100.00%. No results found. No results found for this or any previous visit (from the past 72 hour(s)).    Constitutional: He appears alert, no distress. Resting in bed quietly until I entered room HENT: poor dentition,  Head: Normocephalic. atraumatic Eyes:  Pupils EOMI, without nystagmus  Neck: Normal range of motion. Neck supple. No thyromegaly present.  Cardiovascular: Normal rate and regular rhythm. No murmurs  Respiratory: Effort normal and breath sounds normal. No respiratory distress. No wheezes, rales, rhonchi  GI: Soft. Bowel sounds are normal. He exhibits no distension.  Neurological:  . Oriented to name and hosp. He did follow one and two step commands.[name] 2-/5 left shoulder ,grip was 2-/5. LLE was 3-/5 with HF and KE and trace at the ankle.  Speech moderately dysarthric.  RUE 4/5 RLE 5/5 Skin: Skin is warm and dry.  Psychiatric:  Flat,  MSK:  - SLR    Assessment/Plan: 1. 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 Physiatrist is providing close team supervision and 24 hour management of active medical problems listed below. Physiatrist and rehab team continue to assess barriers to discharge/monitor patient progress toward functional and medical goals FIM: FIM - Bathing Bathing Steps Patient Completed:  Chest;Left Arm;Abdomen;Front perineal area;Right upper leg;Left upper leg;Right Arm;Right lower leg (including foot);Left lower leg (including foot) Bathing: 4: Steadying assist  FIM - Upper Body Dressing/Undressing Upper body dressing/undressing steps patient completed: Put head through opening of pull over shirt/dress;Thread/unthread right sleeve of pullover shirt/dresss;Pull shirt over trunk;Thread/unthread left sleeve of pullover shirt/dress Upper body dressing/undressing: 5: Set-up assist to: Obtain clothing/put away FIM - Lower Body Dressing/Undressing Lower body dressing/undressing steps patient completed: Thread/unthread right underwear leg;Thread/unthread left underwear leg;Pull underwear up/down;Thread/unthread right pants leg;Thread/unthread left pants leg;Pull pants up/down;Fasten/unfasten pants;Don/Doff right shoe Lower body dressing/undressing: 3: Mod-Patient completed 50-74% of tasks  FIM - Toileting Toileting steps completed by patient: Adjust clothing prior to toileting;Performs perineal hygiene;Adjust clothing after toileting Toileting Assistive Devices: Grab bar or rail for support Toileting: 4: Steadying assist  FIM - Diplomatic Services operational officer Devices: Grab bars Toilet Transfers: 4-To toilet/BSC: Min A (steadying Pt. > 75%);4-From toilet/BSC: Min A (steadying Pt. > 75%)  FIM - Banker Devices: Orthosis Bed/Chair Transfer: 5: Bed > Chair or W/C: Supervision (verbal cues/safety issues);5: Chair or W/C > Bed: Supervision (verbal cues/safety issues)  FIM - Locomotion: Wheelchair Distance: 150 Locomotion: Wheelchair: 0: Activity did not occur FIM - Locomotion: Ambulation Locomotion: Ambulation Assistive Devices: Orthosis Ambulation/Gait Assistance: 4: Min assist;3: Mod assist Locomotion: Ambulation: 3: Travels 150 ft or more with moderate assistance (Pt: 50 - 74%)  Comprehension Comprehension Mode:  Auditory Comprehension: 5-Understands basic 90% of the time/requires cueing < 10% of the time  Expression Expression Mode: Verbal Expression: 4-Expresses basic 75 - 89% of the time/requires cueing 10 - 24% of the time. Needs helper to occlude trach/needs to repeat words.  Social  Interaction Social Interaction: 4-Interacts appropriately 75 - 89% of the time - Needs redirection for appropriate language or to initiate interaction.  Problem Solving Problem Solving: 3-Solves basic 50 - 74% of the time/requires cueing 25 - 49% of the time  Memory Memory: 4-Recognizes or recalls 75 - 89% of the time/requires cueing 10 - 24% of the time  Medical Problem List and Plan:  1. 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 DVT Prophylaxis/Anticoagulation: SCDs. Monitor for any signs of DVT  3. Pain Management: Tylenol as needed. 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 Neuropsych: This patient is not capable of making decisions on his own behalf.  5. Seizure prophylaxis. Keppra 500 mg twice a day. Monitor for any seizure activity  6. Hypertension. Norvasc 10 mg daily,D/C lisinopril 2.5, propranolol 10mg  TID will increase to 20mg                               7. Dysphagia.[name]. dysphagia 3 thin liquids. Followup speech therapy ,appetitie improved off  megace ,cont Remeron 8. History of tobacco alcohol abuse. Urine drug screen positive cocaine and marijuana.  9.  Rectal pain,skin tear from transfer, local care per RN 10.  Mood- improved on remeron      LOS (Days) 29 A FACE TO FACE EVALUATION WAS PERFORMED  Jaecob Lowden E 03/31/2013, 6:49 AM

## 2013-04-01 ENCOUNTER — Inpatient Hospital Stay (HOSPITAL_COMMUNITY): Payer: Medicaid Other | Admitting: Occupational Therapy

## 2013-04-01 ENCOUNTER — Inpatient Hospital Stay (HOSPITAL_COMMUNITY): Payer: Self-pay | Admitting: Physical Therapy

## 2013-04-01 ENCOUNTER — Inpatient Hospital Stay (HOSPITAL_COMMUNITY): Payer: Medicaid Other | Admitting: Speech Pathology

## 2013-04-01 DIAGNOSIS — I619 Nontraumatic intracerebral hemorrhage, unspecified: Secondary | ICD-10-CM

## 2013-04-01 DIAGNOSIS — I69998 Other sequelae following unspecified cerebrovascular disease: Secondary | ICD-10-CM

## 2013-04-01 DIAGNOSIS — R209 Unspecified disturbances of skin sensation: Secondary | ICD-10-CM

## 2013-04-01 DIAGNOSIS — G8110 Spastic hemiplegia affecting unspecified side: Secondary | ICD-10-CM

## 2013-04-01 NOTE — Progress Notes (Signed)
Speech Language Pathology Daily Session Note  Patient Details  Name: Eric May MRN: [id number] Date of Birth: 1960-07-12  Today's Date: 04/01/2013 Time: 1000-1030 Time Calculation (min): 30 min (-15 minutes)  Short Term Goals: Week 5: SLP Short Term Goal 1 (Week 5): Patient will select attention to task for 10 minutes with Min clinician cues. SLP Short Term Goal 2 (Week 5): Patient will solve moderately complex problems with Min verbal cues. SLP Short Term Goal 3 (Week 5): Patient will anticipate needs for discharge with Min question cues SLP Short Term Goal 4 (Week 5): Patient will self-monitor and correct verbal perseveration with Supervision level verbal cues.   Skilled Therapeutic Interventions: Skilled treatment session focused on addressing cognitive goals. Upon SLP arrival patient sitting on toilet waiting for assistance to get up.  SLP facilitated session with facilitated session with Min physical assist to utilize walker.  Patient performed grooming at sink from chair with Supervision level verbal cues to attend to left; SLP set-up breakfast and provided initial cues to attend to left.  Session was ended early due to patient wanting to eat without SLP assist.  Continue with current plan of care.         FIM:  Comprehension Comprehension Mode: Auditory Comprehension: 5-Understands basic 90% of the time/requires cueing < 10% of the time Expression Expression Mode: Verbal Expression: 4-Expresses basic 75 - 89% of the time/requires cueing 10 - 24% of the time. Needs helper to occlude trach/needs to repeat words. Social Interaction Social Interaction: 3-Interacts appropriately 50 - 74% of the time - May be physically or verbally inappropriate. Problem Solving Problem Solving: 3-Solves basic 50 - 74% of the time/requires cueing 25 - 49% of the time Memory Memory: 4-Recognizes or recalls 75 - 89% of the time/requires cueing 10 - 24% of the time FIM - Eating Eating Activity: 5:  Set-up assist for cut food;5: Set-up assist for open containers;5: Supervision/cues  Pain Pain Assessment Pain Assessment: No/denies pain  Therapy/Group: Individual Therapy  Charlane FerrettiMelissa Ghina Bittinger, M.A., CCC-SLP [phone]  [place] 04/01/2013, 12:22 PM

## 2013-04-01 NOTE — Progress Notes (Signed)
53 y.o. right-handed male with history of hypertension, tobacco and alcohol abuse. Admitted 02/23/2013 when patient developed left-sided numbness and weakness while at work. Patient reports falling at that time due to weakness on the left.   Subjective/Complaints: No new issues. "no use in complaining" Review of Systems -No chest pain or SOB, no abd or rectal pain Objective: Vital Signs: Blood pressure 126/84, pulse 80, temperature 97.8 F (36.6 C), temperature source Oral, resp. rate 18, height 6' (1.829 m), weight 58.514 kg (129 lb), SpO2 98.00%. No results found. No results found for this or any previous visit (from the past 72 hour(s)).    Constitutional: He appears alert, no distress. Resting in bed quietly until I entered room HENT: poor dentition,  Head: Normocephalic. atraumatic Eyes:  Pupils EOMI, without nystagmus  Neck: Normal range of motion. Neck supple. No thyromegaly present.  Cardiovascular: Normal rate and regular rhythm. No murmurs  Respiratory: Effort normal and breath sounds normal. No respiratory distress. No wheezes, rales, rhonchi  GI: Soft. Bowel sounds are normal. He exhibits no distension.  Neurological:  . Oriented to name and hosp. He did follow one and two step commands.[name] 2-/5 left shoulder ,grip was 2-/5. LLE was 3-/5 with HF and KE and trace at the ankle.  Speech moderately dysarthric.  RUE 4/5 RLE 5/5 Skin: Skin is warm and dry.  Psychiatric:  Flat,  MSK:  - SLR    Assessment/Plan: 1. 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 Physiatrist is providing close team supervision and 24 hour management of active medical problems listed below. Physiatrist and rehab team continue to assess barriers to discharge/monitor patient progress toward functional and medical goals FIM: FIM -  Bathing Bathing Steps Patient Completed: Chest;Left Arm;Abdomen;Front perineal area;Right upper leg;Left upper leg;Right Arm;Right lower leg (including foot);Left lower leg (including foot) Bathing: 5: Supervision: Safety issues/verbal cues  FIM - Upper Body Dressing/Undressing Upper body dressing/undressing steps patient completed: Put head through opening of pull over shirt/dress;Thread/unthread right sleeve of pullover shirt/dresss;Pull shirt over trunk;Thread/unthread left sleeve of pullover shirt/dress Upper body dressing/undressing: 5: Set-up assist to: Obtain clothing/put away FIM - Lower Body Dressing/Undressing Lower body dressing/undressing steps patient completed: Thread/unthread right underwear leg;Thread/unthread left underwear leg;Pull underwear up/down;Thread/unthread right pants leg;Thread/unthread left pants leg;Pull pants up/down;Fasten/unfasten pants Lower body dressing/undressing: 3: Mod-Patient completed 50-74% of tasks  FIM - Toileting Toileting steps completed by patient: Adjust clothing prior to toileting;Performs perineal hygiene;Adjust clothing after toileting Toileting Assistive Devices: Grab bar or rail for support Toileting: 5: Supervision: Safety issues/verbal cues  FIM - Diplomatic Services operational officer Devices: Grab bars;Walker Toilet Transfers: 6-Assistive device: No helper  FIM - Banker Devices: Therapist, occupational: 5: Supine > Sit: Supervision (verbal cues/safety issues);5: Bed > Chair or W/C: Supervision (verbal cues/safety issues)  FIM - Locomotion: Wheelchair Distance: 150 Locomotion: Wheelchair: 0: Activity did not occur FIM - Locomotion: Ambulation Locomotion: Ambulation Assistive Devices: Orthosis Ambulation/Gait Assistance: 4: Min assist;3: Mod assist Locomotion: Ambulation: 3: Travels 150 ft or more with moderate assistance (Pt: 50 - 74%)  Comprehension Comprehension Mode:  Auditory Comprehension: 5-Understands basic 90% of the time/requires cueing < 10% of the time  Expression Expression Mode: Verbal Expression: 5-Expresses basic 90% of the time/requires cueing < 10% of the time.  Social Interaction Social Interaction: 5-Interacts appropriately 90% of the time - Needs monitoring or encouragement for  participation or interaction.  Problem Solving Problem Solving: 4-Solves basic 75 - 89% of the time/requires cueing 10 - 24% of the time  Memory Memory: 5-Recognizes or recalls 90% of the time/requires cueing < 10% of the time  Medical Problem List and Plan:  1. 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 DVT Prophylaxis/Anticoagulation: SCDs. Monitor for any signs of DVT  3. Pain Management: Tylenol as needed. 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 Neuropsych: This patient is not capable of making decisions on his own behalf.  5. Seizure prophylaxis. Keppra 500 mg twice a day. Monitor for any seizure activity  6. Hypertension. Norvasc 10 mg daily,D/C lisinopril 2.5, propranolol 20mg  TID  7. Dysphagia.[name]. dysphagia 3 thin liquids. Followup speech therapy ,appetitie improved off  megace ,cont Remeron 8. History of tobacco alcohol abuse. Urine drug screen positive cocaine and marijuana.  9.  Rectal pain,skin tear from transfer, local care per RN 10.  Mood- improved somewhat on remeron      LOS (Days) 30 A FACE TO FACE EVALUATION WAS PERFORMED  Keva Darty T 04/01/2013, 9:22 AM

## 2013-04-01 NOTE — Progress Notes (Signed)
Patient c/o of Lf. Ear pain. Gave 50mg . Ultram and had patient sit on side of bed. Vitals signs stable at 130/80 and HR of 76. No signs of facial droop or slurred speech. Patient able to carry on conversation with this RN without difficulty. Patient now sitting in recliner eating dinner. Will continue to monitor.

## 2013-04-01 NOTE — Progress Notes (Signed)
Occupational Therapy Note  Patient Details  Name: Eric May MRN: [id number] Date of Birth: [birth year] Today's Date: 04/01/2013  Pt missed 45 mins skilled OT treatment session secondary to fatigue.  Pt reports being worn out from outing yesterday and multiple visitors last night as well as incident with condom cath over night.  Reports understanding the need to participate in therapy but reports too fatigued at this time.  Will follow up as able.  [name], Gatha Mcnulty 04/01/2013, 9:06 AM

## 2013-04-01 NOTE — Progress Notes (Signed)
Physical Therapy Session Note  Patient Details  Name: [name] MRN: [id number] Date of Birth: March 10, 1960  Today's Date: 04/01/2013  Short Term Goals: No short term goals set  Skilled Therapeutic Interventions/Progress Updates:   Pt asleep in bed; upon awakening pt reporting significant fatigue today from poor night's rest.  Assisted pt with use of urinal in standing but pt requested to return to supine in bed to continue to rest the rest of the afternoon.  Will f/u with pt this weekend.  Pt left with all items within reach.   Therapy Documentation Precautions:  Precautions Precautions: Fall Precaution Comments: pt con't to be agitated at onset of pain Restrictions Weight Bearing Restrictions: No General: Amount of Missed PT Time (min): 45 Minutes Missed Time Reason: Patient fatigue  Edman CircleHall, Hamzeh Tall [place] 04/01/2013, 2:12 PM

## 2013-04-01 NOTE — Progress Notes (Signed)
Occupational Therapy Weekly Progress Note  Patient Details  Name: Eric May MRN: [id number] Date of Birth: 1960/07/10  Today's Date: 04/01/2013  Short term goals not set due to estimated length of stay. No short term goals set due to pt awaiting SNF. Pt making steady progress towards goals with improvements in participation and motivated to return home however family members unsure if they can provide necessary assist.  Pt demonstrating increased use of LUE with self-care tasks, however continues to have decreased coordination and motor planning.    Patient continues to demonstrate the following deficits: Lt hemiparesis and absent sensation and proprioception, Lt sided inattention, impaired motor control, timing, sequencing, impaired balance and therefore will continue to benefit from skilled OT intervention to enhance overall performance with BADL and Reduce care partner burden.  See Patient's Care Plan for progression toward long term goals.  Patient progressing toward long term goals..  Plan of care revisions: Upgraded LB dressing and toileting goals from mod assist to min assist.  See FIM for current functional status  Therapy/Group: Individual Therapy  [name], [name] 04/01/2013, 7:27 AM

## 2013-04-02 NOTE — Progress Notes (Signed)
Patient ID: Eric May, male   DOB: 1960/12/18, 53 y.o.   MRN: [id number]  04/02/13.  53 y.o. right-handed male with history of hypertension, tobacco and alcohol abuse. Admitted 02/23/2013 when patient developed left-sided numbness and weakness while at work. Patient reports falling at that time due to weakness on the left.  Patient admitted with ICH. Remains on Keppra for seizure prophylaxis  Subjective/Complaints: No new issues.   Review of Systems -No chest pain or SOB, no abd or rectal pain  Patient Vitals for the past 24 hrs:  BP Temp Temp src Pulse Resp SpO2  04/02/13 0743 127/77 mmHg - - 72 - -  04/02/13 0625 123/79 mmHg 97.5 F (36.4 C) Oral 77 18 100 %  04/01/13 2053 125/79 mmHg - - 73 - -  04/01/13 1809 130/80 mmHg 97.4 F (36.3 C) Oral 76 - -  04/01/13 1434 111/67 mmHg - - 75 - -     Intake/Output Summary (Last 24 hours) at 04/02/13 0908 Last data filed at 04/02/13 0800  Gross per 24 hour  Intake    480 ml  Output      2 ml  Net    478 ml     Objective: Vital Signs: Blood pressure 127/77, pulse 72, temperature 97.5 F (36.4 C), temperature source Oral, resp. rate 18, height 6' (1.829 m), weight 129 lb (58.514 kg), SpO2 100.00%. No results found. No results found for this or any previous visit (from the past 72 hour(s)).    Constitutional: He appears alert but slightly somnolent, no distress.HENT: poor dentition,  Head: Normocephalic. atraumatic Eyes:  Pupils EOMI, without nystagmus  Neck: Normal range of motion. Neck supple. No thyromegaly present.  Cardiovascular: Normal rate and regular rhythm. No murmurs  Respiratory: Effort normal and breath sounds normal. No respiratory distress. No wheezes, rales, rhonchi  GI: Soft. Bowel sounds are normal. He exhibits no distension.  Neurological:  . Oriented to name and hosp. He did follow one and two step commands.Eric May. 2-/5 left shoulder ,grip was 2-/5. LLE was 3-/5 with HF and KE and trace at the ankle.  Speech  moderately dysarthric.  RUE 4/5 RLE 5/5 Skin: Skin is warm and dry.     AMedical Problem List and Plan:   1. 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 Seizure prophylaxis. Keppra 500 mg twice a day. Monitor for any seizure activity  3. Hypertension. Norvasc 10 mg daily,D/C lisinopril 2.5, propranolol 20mg  TID  4.  Dysphagia.Eric May. dysphagia 3 thin liquids. Followup speech therapy ,appetitie improved off  megace ,cont Remeron 5. History of tobacco alcohol abuse. Urine drug screen positive cocaine and marijuana.      LOS (Days) 31 A FACE TO FACE EVALUATION WAS PERFORMED  Eric May,Eric May 04/02/2013, 9:06 AM

## 2013-04-03 ENCOUNTER — Inpatient Hospital Stay (HOSPITAL_COMMUNITY): Payer: Medicaid Other | Admitting: Physical Therapy

## 2013-04-03 NOTE — Progress Notes (Signed)
Patient ID: Eric May, male   DOB: [birth year], 53 y.o.   MRN: [id number]  Patient ID: Eric May, male   DOB: [birth year], 53 y.o.   MRN: [id number]  04/03/13.  53 y.o. right-handed male with history of hypertension, tobacco and alcohol abuse. Admitted 02/23/2013 when patient developed left-sided numbness and weakness while at work. Patient reports falling at that time due to weakness on the left.  Patient admitted with ICH. Remains on Keppra for seizure prophylaxis  Subjective/Complaints: No new issues.   Review of Systems -No chest pain or SOB, no abd or rectal pain  Patient Vitals for the past 24 hrs:  BP Temp Temp src Pulse Resp SpO2  04/03/13 0621 129/82 mmHg 97 F (36.1 C) Oral 68 18 100 %  04/02/13 1508 160/82 mmHg 97.7 F (36.5 C) Oral 80 18 100 %  04/02/13 1348 131/74 mmHg - - 69 - -     Intake/Output Summary (Last 24 hours) at 04/03/13 0821 Last data filed at 04/02/13 2115  Gross per 24 hour  Intake    360 ml  Output    652 ml  Net   -292 ml     Objective: Vital Signs: Blood pressure 129/82, pulse 68, temperature 97 F (36.1 C), temperature source Oral, resp. rate 18, height 6' (1.829 m), weight 129 lb (58.514 kg), SpO2 100.00%. No results found. No results found for this or any previous visit (from the past 72 hour(s)).    Constitutional: He appears alert but slightly somnolent, no distress.HENT: poor dentition,  Head: Normocephalic. atraumatic Eyes:  Pupils EOMI, without nystagmus  Neck: Normal range of motion. Neck supple. No thyromegaly present.  Cardiovascular: Normal rate and regular rhythm. No murmurs  Respiratory: Effort normal and breath sounds normal. No respiratory distress. No wheezes, rales, rhonchi  GI: Soft. Bowel sounds are normal. He exhibits no distension.  Neurological:  . Oriented to name and hosp. He did follow one and two step commands.[name]. 2-/5 left shoulder ,grip was 2-/5. LLE was 3-/5 with HF and KE and trace at the ankle. RUE 4/5  RLE 5/5 Skin: Skin is warm and dry. Nicotine patch noted L chest     AMedical Problem List and Plan:   1. 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 Seizure prophylaxis. Keppra 500 mg twice a day. Monitor for any seizure activity  3. Hypertension. Norvasc 10 mg daily,D/C lisinopril 2.5, propranolol 20mg  TID Well controlled  4.  Dysphagia.[name]. dysphagia 3 thin liquids. Followup speech therapy ,appetitie improved off  megace ,cont Remeron 5. History of tobacco alcohol abuse. Urine drug screen positive cocaine and marijuana.      LOS (Days) 32 A FACE TO FACE EVALUATION WAS PERFORMED  [name],Ryer Asato FRANK 04/03/2013, 8:21 AM

## 2013-04-03 NOTE — Progress Notes (Signed)
Physical Therapy Session Note  Patient Details  Name: Eric May MRN: [id number] Date of Birth: [birth year]  Today's Date: 04/03/2013 Time: 1000-1100 Time Calculation (min): 60 min  Short Term Goals: No short term goals set  Skilled Therapeutic Interventions/Progress Updates:   Pt more rested today.  Seated in recliner; assisted with donning sweatshirt, shoes and AFO.  Transferred to w/c supervision.  Transported outside in w/c.  Therapy to focus on gait training outside with RW and min A >150' with intermittent verbal cues to attend to safety with RW on uneven, paved surfaces, down 5 cement stairs with R rail with verbal cues for safe step to sequencing with AFO, curb negotiation with RW with min A and verbal cues for safe sequence and over uneven grass without AD but with min A secondary for balance.  Returned to room and pt set up at sink to perform grooming tasks.    Therapy Documentation Precautions:  Precautions Precautions: Fall Precaution Comments: pt con't to be agitated at onset of pain Restrictions Weight Bearing Restrictions: No Pain: Pain Assessment Pain Assessment: No/denies pain Locomotion : Ambulation Ambulation/Gait Assistance: 4: Min guard   See FIM for current functional status  Therapy/Group: Individual Therapy  Edman CircleHall, Greyden Besecker Faucette 04/03/2013, 12:12 PM

## 2013-04-04 ENCOUNTER — Inpatient Hospital Stay (HOSPITAL_COMMUNITY): Payer: Medicaid Other | Admitting: Physical Therapy

## 2013-04-04 ENCOUNTER — Inpatient Hospital Stay (HOSPITAL_COMMUNITY): Payer: Medicaid Other | Admitting: Speech Pathology

## 2013-04-04 ENCOUNTER — Inpatient Hospital Stay (HOSPITAL_COMMUNITY): Payer: Medicaid Other | Admitting: Occupational Therapy

## 2013-04-04 DIAGNOSIS — I69998 Other sequelae following unspecified cerebrovascular disease: Secondary | ICD-10-CM

## 2013-04-04 DIAGNOSIS — R209 Unspecified disturbances of skin sensation: Secondary | ICD-10-CM

## 2013-04-04 DIAGNOSIS — I619 Nontraumatic intracerebral hemorrhage, unspecified: Secondary | ICD-10-CM

## 2013-04-04 DIAGNOSIS — G8110 Spastic hemiplegia affecting unspecified side: Secondary | ICD-10-CM

## 2013-04-04 MED ORDER — CARBAMIDE PEROXIDE 6.5 % OT SOLN
5.0000 [drp] | Freq: Two times a day (BID) | OTIC | Status: AC
  Administered 2013-04-04 – 2013-04-06 (×6): 5 [drp] via OTIC
  Filled 2013-04-04 (×3): qty 15

## 2013-04-04 NOTE — Progress Notes (Signed)
Occupational Therapy Session Note  Patient Details  Name: Eric May MRN: [id number] Date of Birth: [birth year]  Today's Date: 04/04/2013 Time: [id number] Time Calculation (min): 45 min  Short Term Goals: No short term goals set  Skilled Therapeutic Interventions/Progress Updates:    Pt seen for ADL retraining with focus on functional mobility with RW, sit <> stand, and functional use of LUE during self-care tasks of bathing and dressing.  Pt in good spirits this session.  Gathered clothing with RW with min assist with ambulation and when reaching outside BOS to obtain clothes from dresser drawer.  Completed dressing with supervision for balance with pulling up pants in standing, however required assist to don socks and Lt shoe.  Pt attempted to tie Rt shoe with cues to visually attend to Lt hand while stabilizing shoe laces.  Pt nearly able to tie shoe with multiple attempts, required assist to tighten laces.  Pt making steady progress with dynamic standing balance, required min cues for safety with RW and to sit to thread pants.  Therapy Documentation Precautions:  Precautions Precautions: Fall Precaution Comments: pt con't to be agitated at onset of pain Restrictions Weight Bearing Restrictions: No General:   Vital Signs: Therapy Vitals Temp: 98.4 F (36.9 C) Temp src: Oral Pulse Rate: 77 Resp: 18 BP: 143/91 mmHg Patient Position, if appropriate: Lying Oxygen Therapy SpO2: 100 % O2 Device: None (Room air) Pain: Pain Assessment Pain Assessment: 0-10 Pain Score: 2  Pain Type: Acute pain Pain Location: Ear Pain Orientation: Left Pain Descriptors / Indicators: Aching Pain Onset: On-going Patients Stated Pain Goal: 0 Pain Intervention(s): Other (Comment) (md notified)  See FIM for current functional status  Therapy/Group: Individual Therapy  [name], Eric Isley 04/04/2013, 9:16 AM

## 2013-04-04 NOTE — Progress Notes (Signed)
Physical Therapy Session Note  Patient Details  Name: Eric May MRN: [id number] Date of Birth: 1961-02-15  Today's Date: 04/04/2013 Time: 1106-1205 Time Calculation (min): 59 min  Short Term Goals: No short term goals set  Skilled Therapeutic Interventions/Progress Updates:   Pt finishing with SLP; recreation therapist present.  Performed first half of session providing emotional support for pt regarding anxieties and fears about leaving the hospital and being back around negative influences at home.  Discussed other activities for pt to become engaged in to help with handling emotional and mental stressors.  Pt appears very motivated to change his lifestyle and to continue to focus on improving his health and mobility.  Performed gait x 150' to gym with RW and min A with verbal cues for full step length with RLE to transition to step through gait pattern and maintaining forward momentum. In gym focused on standing balance, weight shifting during LUE task of spraying glass cleaner and cleaning mirror glass from high > low with LUE/paper towel.  Pt required assistance to maintain palm contact on mirror (secondary to extensor weakness and flexor tightness); pt began to c/o shoulder pain/throbbing with reaching above shoulder level at acromion.  Returned to sitting and placed bilat palms on mirror with focus on active elbow extension for LUE wrist increased ROM and partial WB and activation of shoulder stabilization muscles.  Performed gait back to room with therapist under RUE still with focus on full step and stride length bilat for more efficient gait velocity.  Pt placed in recliner with LUE supported and one time use hot pack on R shoulder.  Pt refused medication and reporting improvement in symptoms when hot pack placed and shoulder in resting position.    Therapy Documentation Precautions:  Precautions Precautions: Fall Precaution Comments: pt con't to be agitated at onset of  pain Restrictions Weight Bearing Restrictions: No Pain: Pain Assessment Pain Assessment: Faces Pain Score: 2  Faces Pain Scale: Hurts even more Pain Type: Acute pain Pain Location: Shoulder Pain Orientation: Left Pain Descriptors / Indicators: Aching;Sore Pain Onset: With Activity Patients Stated Pain Goal: 0 Pain Intervention(s): Heat applied;Rest;Repositioned Locomotion : Ambulation Ambulation/Gait Assistance: 4: Min guard   See FIM for current functional status  Therapy/Group: Individual Therapy  Edman CircleHall, Dane Bloch [facility] Inc[facility]aucette 04/04/2013, 12:33 PM

## 2013-04-04 NOTE — Progress Notes (Signed)
Recreational Therapy Session Note  Patient Details  Name: Gerre Pebblesnthony Buelow MRN: [id number] Date of Birth: February 24, 1961 Today's Date: 04/04/2013  Pain: c/o left shoulder pain with activity, relieved with rest, heat applied at end of session & RN aware Skilled Therapeutic Interventions/Progress Updates:  Session focused on discharge planning, emotional support as pt reports stress/anxiety regarding lifestyle changes post discharge, ambulation with RW, dynamic standing balance, LUE use & attention to the left.  Session began with discussion on use of leisure time post discharge, potential support systems to improve health & wellness. Pt appears motivated to make lifestyle changes.  Pt ambulated with RW with min assist, stood to clean mirror in gym(job related/ leisure activity) uisng LUE.  Therapy/Group: Co-Treatment   Azir Muzyka 04/04/2013, 3:41 PM

## 2013-04-04 NOTE — Progress Notes (Signed)
Speech Language Pathology Daily Session Note  Patient Details  Name: Eric May MRN: [id number] Date of Birth: 1960-06-30  Today's Date: 04/04/2013 Time: 1020-1105 Time Calculation (min): 45 min  Short Term Goals: Week 5: SLP Short Term Goal 1 (Week 5): Patient will select attention to task for 10 minutes with Min clinician cues. SLP Short Term Goal 2 (Week 5): Patient will solve moderately complex problems with Min verbal cues. SLP Short Term Goal 3 (Week 5): Patient will anticipate needs for discharge with Min question cues SLP Short Term Goal 4 (Week 5): Patient will self-monitor and correct verbal perseveration with Supervision level verbal cues.   Skilled Therapeutic Interventions: Skilled treatment session focused on addressing cognitive goals. SLP facilitated session with task that focused on addressing working memory with use of association strategies.  Patient required Mod cues to learn procedures of task; SLP was able to fade to Supervision level verbal cues by end of task.  Patient required Min verbal cues to utilize memory strategies.  SLP also facilitated session with discussion regarding planning for discharge and modifications that will be needed.  Continue with current plan of care.         FIM:  Comprehension Comprehension Mode: Auditory Comprehension: 5-Understands basic 90% of the time/requires cueing < 10% of the time Expression Expression Mode: Verbal Expression: 5-Expresses basic 90% of the time/requires cueing < 10% of the time. Social Interaction Social Interaction: 5-Interacts appropriately 90% of the time - Needs monitoring or encouragement for participation or interaction. Problem Solving Problem Solving: 4-Solves basic 75 - 89% of the time/requires cueing 10 - 24% of the time Memory Memory: 4-Recognizes or recalls 75 - 89% of the time/requires cueing 10 - 24% of the time  Pain Pain Assessment Pain Assessment: No/denies pain  Therapy/Group: Individual  Therapy  Charlane FerrettiMelissa Maryclare Nydam, M.A., CCC-SLP [phone]  Rosmery Duggin 04/04/2013, 11:15 AM

## 2013-04-04 NOTE — Progress Notes (Signed)
53 y.o. right-handed male with history of hypertension, tobacco and alcohol abuse. Admitted 02/23/2013 when patient developed left-sided numbness and weakness while at work. Patient reports falling at that time due to weakness on the left. Subjective/Complaints: Left Ear pain, reduced hearing , hx of reduced hearing but no pain in past  no ear surgeries Review of Systems -No chest pain or SOB, no abd or rectal pain Objective: Vital Signs: Blood pressure 143/91, pulse 77, temperature 98.4 F (36.9 C), temperature source Oral, resp. rate 18, height 6' (1.829 m), weight 58.514 kg (129 lb), SpO2 100.00%. No results found. No results found for this or any previous visit (from the past 72 hour(s)).    Constitutional: He appears alert, no distress. Resting in bed quietly until I entered room HENT: poor dentition, occlusive cerumenosis in ext canal Head: Normocephalic. atraumatic Eyes:  Pupils EOMI, without nystagmus  Neck: Normal range of motion. Neck supple. No thyromegaly present.  Cardiovascular: Normal rate and regular rhythm. No murmurs  Respiratory: Effort normal and breath sounds normal. No respiratory distress. No wheezes, rales, rhonchi  GI: Soft. Bowel sounds are normal. He exhibits no distension.  Neurological:  . Oriented to name and hosp. He did follow one and two step commands.[name]. 2-/5 left shoulder ,grip was 2-/5. LLE was 3-/5 with HF and KE and trace at the ankle.  Speech moderately dysarthric.  RUE 4/5 RLE 5/5 Skin: Skin is warm and dry.  Psychiatric:  Flat,  MSK:  - SLR    Assessment/Plan: 1. 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 Physiatrist is providing close team supervision and 24 hour management of active medical problems listed below. Physiatrist and rehab team continue to assess barriers to  discharge/monitor patient progress toward functional and medical goals FIM: FIM - Bathing Bathing Steps Patient Completed: Chest;Left Arm;Abdomen;Front perineal area;Right upper leg;Left upper leg;Right Arm;Right lower leg (including foot);Left lower leg (including foot) Bathing: 5: Supervision: Safety issues/verbal cues  FIM - Upper Body Dressing/Undressing Upper body dressing/undressing steps patient completed: Put head through opening of pull over shirt/dress;Thread/unthread right sleeve of pullover shirt/dresss;Pull shirt over trunk;Thread/unthread left sleeve of pullover shirt/dress Upper body dressing/undressing: 5: Set-up assist to: Obtain clothing/put away FIM - Lower Body Dressing/Undressing Lower body dressing/undressing steps patient completed: Thread/unthread right underwear leg;Thread/unthread left underwear leg;Pull underwear up/down;Thread/unthread right pants leg;Thread/unthread left pants leg;Pull pants up/down;Fasten/unfasten pants Lower body dressing/undressing: 3: Mod-Patient completed 50-74% of tasks  FIM - Toileting Toileting steps completed by patient: Performs perineal hygiene;Adjust clothing after toileting;Adjust clothing prior to toileting Toileting Assistive Devices: Grab bar or rail for support Toileting: 5: Supervision: Safety issues/verbal cues  FIM - Diplomatic Services operational officerToilet Transfers Toilet Transfers Assistive Devices: Art gallery managerWalker Toilet Transfers: 6-Assistive device: No helper  FIM - BankerBed/Chair Transfer Bed/Chair Transfer Assistive Devices: Therapist, occupationalWalker Bed/Chair Transfer: 5: Chair or W/C > Bed: Supervision (verbal cues/safety issues);5: Bed > Chair or W/C: Supervision (verbal cues/safety issues)  FIM - Locomotion: Wheelchair Distance: 150 Locomotion: Wheelchair: 1: Total Assistance/staff pushes wheelchair (Pt<25%) FIM - Locomotion: Ambulation Locomotion: Ambulation Assistive Devices: Orthosis;Walker - Rolling Ambulation/Gait Assistance: 4: Min guard Locomotion: Ambulation: 4:  Travels 150 ft or more with minimal assistance (Pt.>75%)  Comprehension Comprehension Mode: Auditory Comprehension: 5-Understands basic 90% of the time/requires cueing < 10% of the time  Expression Expression Mode: Verbal Expression: 4-Expresses basic 75 - 89% of the time/requires cueing 10 - 24% of the time. Needs  helper to occlude trach/needs to repeat words.  Social Interaction Social Interaction: 3-Interacts appropriately 50 - 74% of the time - May be physically or verbally inappropriate.  Problem Solving Problem Solving: 3-Solves basic 50 - 74% of the time/requires cueing 25 - 49% of the time  Memory Memory: 4-Recognizes or recalls 75 - 89% of the time/requires cueing 10 - 24% of the time  Medical Problem List and Plan:  1. 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 DVT Prophylaxis/Anticoagulation: SCDs. Monitor for any signs of DVT  3. Pain Management: Tylenol as needed. 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 Neuropsych: This patient is not capable of making decisions on his own behalf.  5. Seizure prophylaxis. Keppra 500 mg twice a day. Monitor for any seizure activity  6. Hypertension. Norvasc 10 mg daily,D/C lisinopril 2.5, propranolol 10mg  TID will increase to 20mg                               7. Dysphagia.[name] dysphagia 3 thin liquids. Followup speech therapy ,appetitie improved off  megace ,cont Remeron 8. History of tobacco alcohol abuse. Urine drug screen positive cocaine and marijuana.  9.  Rectal pain,resolved 10.  Mood- improved on remeron 11.  Ear pain Left , wax visible in ext canal , debrox 5gtt Left ear BID for 3 days     LOS (Days) 33 A FACE TO FACE EVALUATION WAS PERFORMED  Eric May 04/04/2013, 8:42 AM

## 2013-04-05 ENCOUNTER — Inpatient Hospital Stay (HOSPITAL_COMMUNITY): Payer: Medicaid Other | Admitting: Speech Pathology

## 2013-04-05 ENCOUNTER — Inpatient Hospital Stay (HOSPITAL_COMMUNITY): Payer: Medicaid Other | Admitting: Occupational Therapy

## 2013-04-05 ENCOUNTER — Inpatient Hospital Stay (HOSPITAL_COMMUNITY): Payer: Self-pay | Admitting: *Deleted

## 2013-04-05 ENCOUNTER — Inpatient Hospital Stay (HOSPITAL_COMMUNITY): Payer: Medicaid Other | Admitting: Physical Therapy

## 2013-04-05 NOTE — Progress Notes (Signed)
Physical Therapy Session Note  Patient Details  Name: Eric May MRN: [id number] Date of Birth: 05/14/1960  Today's Date: 04/05/2013 Time: 1300-1400 Time Calculation (min): 60 min  Short Term Goals: No short term goals set  Skilled Therapeutic Interventions/Progress Updates:   Pt finishing lunch; pt with cotton in ears s/p ear irrigation; pt still reporting some pain in L ear but reports being premedicated for pain.  Pt agreeable to therapy.  Performed gait to/from gym with RW, AFO and min A with intermittent verbal cues for full RLE step length and upright gaze, tactile cues and assistance needed to maintain RW in midline and for full L pelvic anterior rotation during swing phase.  In gym pt placed in upright chair and NMR performed with Kinesiotape for inhibition of R upper trap/levator scapula and activation of musculature for scapular retraction and depression for improved positioning for AROM and pain management.  Pt transitioned to mat and sit <> supine with supervision.  Continued NMR in supine for shoulder/scapular stabilization with focus on activation of deep neck flexors and bilat scapular depression, retraction during ER and shoulder flexion to 90 deg (bilat UE holding yard stick).  Returned to room and positioned in recliner with father present to visit.  No reports of pain in shoulder during stabilization exercises.    Therapy Documentation Precautions:  Precautions Precautions: Fall Precaution Comments: pt con't to be agitated at onset of pain Restrictions Weight Bearing Restrictions: No Pain: Pain Assessment Pain Assessment: No/denies pain Pain Score: 4  Faces Pain Scale: Hurts a little bit Pain Type: Acute pain Pain Location: Ear Pain Orientation: Left Pain Descriptors / Indicators: Sore;Aching Pain Onset: On-going (following irrigation of the ear) Locomotion : Ambulation Ambulation/Gait Assistance: 4: Min guard   See FIM for current functional  status  Therapy/Group: Individual Therapy  Edman CircleHall, [name] [facility] LPFauc[facility] 04/05/2013, 3:08 PM

## 2013-04-05 NOTE — Progress Notes (Signed)
NUTRITION FOLLOW UP  Intervention:   1.  General healthful diet; encourage intake of foods and beverages as able.  RD to follow and assess for nutritional adequacy.  2.  Resource Breeze po TID, each supplement provides 250 kcal and 9 grams of protein  NUTRITION DIAGNOSIS:  Inadequate oral intake related to poor appetite as evidenced by PO intake variable at meals- progressing  Monitor:  1. Food/Beverage; pt meeting >/=90% estimated needs with tolerance - met  2. Wt/wt change; monitor trends - pt's weight down 1 pound since admission   Assessment:   Pt admitted with deconditioning r/t right temporoparietal ICH.  Pt's intake remains 75-100% of his meals.  Pt loves Peach and Arabella Merles and reports drinking 2-3 per day.  Despite overall improved PO intake, pt continues to lose wt now 129 lbs from ~2 weeks ago which is likely related to variability in intake and size of meals.   Pt continues to order the same moderate sized meals.  He has very little variation in his diet.  He is ok with this, however it is difficult to determine whether intake (although 100% of meals) is adequate to sustain weight maintenance and promote lean body mass growth.  Pt is difficult to motivate when it comes to eating due to his preferences.  He does have snacks and continues to drink [place] as well. Will monitor for changes in weight- due for weekly weight tomorrow.     Height: Ht Readings from Last 1 Encounters:  03/02/13 6' (1.829 m)    Weight Status:   Wt Readings from Last 1 Encounters:  03/30/13 129 lb (58.514 kg)  Admit wt:        132 lb 7.9 oz (60.1 kg)  Re-estimated needs:  Kcal: 2100-2280  Protein: 90-110g  Fluid: >2.1 L/day  Skin: intact  Diet Order: General   Intake/Output Summary (Last 24 hours) at 04/05/13 1252 Last data filed at 04/05/13 0900  Gross per 24 hour  Intake    720 ml  Output    200 ml  Net    520 ml    Last BM: 2/9  Labs:  No results found for this basename:  NA, K, CL, CO2, BUN, CREATININE, CALCIUM, MG, PHOS, GLUCOSE,  in the last 168 hours  CBG (last 3)  No results found for this basename: GLUCAP,  in the last 72 hours  Scheduled Meds: . amLODipine  10 mg Oral Daily  . antiseptic oral rinse  15 mL Mouth Rinse BID  . carbamide peroxide  5 drop Left Ear BID  . feeding supplement (RESOURCE BREEZE)  1 Container Oral TID BM  . folic acid  1 mg Oral Daily  . gabapentin  100 mg Oral TID  . levETIRAcetam  500 mg Oral BID  . mirtazapine  15 mg Oral QHS  . multivitamin with minerals  1 tablet Oral Daily  . nicotine  21 mg Transdermal Daily  . pantoprazole  40 mg Oral Daily  . potassium chloride  20 mEq Oral Daily  . propranolol  20 mg Oral TID  . QUEtiapine  50 mg Oral QHS  . senna-docusate  2 tablet Oral BID  . sorbitol, milk of mag, mineral oil, glycerin (SMOG) enema  960 mL Rectal Once  . thiamine  100 mg Oral Daily    Brynda Greathouse, MS RD LDN Clinical Inpatient Dietitian Pager: [phone] Weekend/After hours pager: [phone]

## 2013-04-05 NOTE — Progress Notes (Signed)
53 y.o. right-handed male with history of hypertension, tobacco and alcohol abuse. Admitted 02/23/2013 when patient developed left-sided numbness and weakness while at work. Patient reports falling at that time due to weakness on the left. Subjective/Complaints: Left Ear pain, reduced hearing , hx of reduced hearing but no pain in past  no ear surgeries Review of Systems -No chest pain or SOB, no abd or rectal pain Objective: Vital Signs: Blood pressure 114/72, pulse 75, temperature 98.3 F (36.8 C), temperature source Oral, resp. rate 17, height 6' (1.829 m), weight 58.514 kg (129 lb), SpO2 100.00%. No results found. No results found for this or any previous visit (from the past 72 hour(s)).    Constitutional: He appears alert, no distress.  HENT: poor dentition, occlusive cerumenosis in ext canal Head: Normocephalic. atraumatic Eyes:  Pupils EOMI, without nystagmus  Neck: Normal range of motion. Neck supple. No thyromegaly present.  Cardiovascular: Normal rate and regular rhythm. No murmurs  Respiratory: Effort normal and breath sounds normal. No respiratory distress. No wheezes, rales, rhonchi  GI: Soft. Bowel sounds are normal. He exhibits no distension.  Neurological:  . Oriented to name and hosp. He did follow one and two step commands.[name] 3-5 left shoulder ,grip was 3-/5. LLE was 3-/5 with HF and KE and trace at the ankle.  Speech moderately dysarthric.  RUE 4/5 RLE 5/5 Skin: Skin is warm and dry.  Psychiatric:  Flat,     Assessment/Plan: 1. 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 Physiatrist is providing close team supervision and 24 hour management of active medical problems listed below. Physiatrist and rehab team continue to assess barriers to discharge/monitor patient progress toward functional and medical  goals FIM: FIM - Bathing Bathing Steps Patient Completed: Chest;Left Arm;Abdomen;Front perineal area;Right upper leg;Left upper leg;Right Arm;Right lower leg (including foot);Left lower leg (including foot) Bathing: 5: Supervision: Safety issues/verbal cues  FIM - Upper Body Dressing/Undressing Upper body dressing/undressing steps patient completed: Put head through opening of pull over shirt/dress;Thread/unthread right sleeve of pullover shirt/dresss;Pull shirt over trunk;Thread/unthread left sleeve of pullover shirt/dress Upper body dressing/undressing: 5: Set-up assist to: Obtain clothing/put away FIM - Lower Body Dressing/Undressing Lower body dressing/undressing steps patient completed: Thread/unthread right underwear leg;Thread/unthread left underwear leg;Pull underwear up/down;Thread/unthread right pants leg;Thread/unthread left pants leg;Pull pants up/down;Fasten/unfasten pants;Don/Doff right sock;Don/Doff right shoe Lower body dressing/undressing: 3: Mod-Patient completed 50-74% of tasks  FIM - Toileting Toileting steps completed by patient: Performs perineal hygiene;Adjust clothing after toileting;Adjust clothing prior to toileting Toileting Assistive Devices: Grab bar or rail for support Toileting: 5: Supervision: Safety issues/verbal cues  FIM - Diplomatic Services operational officer Devices: Art gallery manager Transfers: 6-Assistive device: No helper  FIM - Banker Devices: Therapist, occupational: 5: Chair or W/C > Bed: Supervision (verbal cues/safety issues);5: Bed > Chair or W/C: Supervision (verbal cues/safety issues)  FIM - Locomotion: Wheelchair Distance: 150 Locomotion: Wheelchair: 0: Activity did not occur FIM - Locomotion: Ambulation Locomotion: Ambulation Assistive Devices: Orthosis;Walker - Rolling Ambulation/Gait Assistance: 4: Min guard Locomotion: Ambulation: 4: Travels 150 ft or more with minimal assistance  (Pt.>75%)  Comprehension Comprehension Mode: Auditory Comprehension: 5-Understands basic 90% of the time/requires cueing < 10% of the time  Expression Expression Mode: Verbal Expression: 4-Expresses basic 75 - 89% of the time/requires cueing 10 - 24% of the time. Needs helper to occlude trach/needs to repeat words.  Social Interaction Social Interaction: 3-Interacts appropriately 50 - 74% of the time - May be physically or verbally inappropriate.  Problem Solving Problem Solving: 3-Solves basic 50 - 74% of the time/requires cueing 25 - 49% of the time  Memory Memory: 4-Recognizes or recalls 75 - 89% of the time/requires cueing 10 - 24% of the time  Medical Problem List and Plan:  1. 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 DVT Prophylaxis/Anticoagulation: SCDs. Monitor for any signs of DVT  3. Pain Management: Tylenol as needed. 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 Neuropsych: This patient is not capable of making decisions on his own behalf.  5. Seizure prophylaxis. Keppra 500 mg twice a day. Monitor for any seizure activity  6. Hypertension. Norvasc 10 mg daily,D/C lisinopril 2.5, propranolol 10mg  TID will increase to 20mg                               7. Dysphagia.[name]. dysphagia 3 thin liquids. Followup speech therapy ,appetitie improved off  megace ,cont Remeron 8. History of tobacco alcohol abuse. Urine drug screen positive cocaine and marijuana.  9.  Rectal pain,resolved 10.  Mood- improved on remeron 11.  Ear pain Left , wax visible in ext canal , debrox 5gtt Left ear BID for 3 days     LOS (Days) 34 A FACE TO FACE EVALUATION WAS PERFORMED  KIRSTEINS,ANDREW E 04/05/2013, 6:34 AM

## 2013-04-05 NOTE — Progress Notes (Signed)
Speech Language Pathology Daily Session Note  Patient Details  Name: Eric May MRN: [id number] Date of Birth: [birth year]  Today's Date: 04/05/2013 Time: 1032-1130 Time Calculation (min): 58 min  Short Term Goals: Week 5: SLP Short Term Goal 1 (Week 5): Patient will select attention to task for 10 minutes with Min clinician cues. SLP Short Term Goal 2 (Week 5): Patient will solve moderately complex problems with Min verbal cues. SLP Short Term Goal 3 (Week 5): Patient will anticipate needs for discharge with Min question cues SLP Short Term Goal 4 (Week 5): Patient will self-monitor and correct verbal perseveration with Supervision level verbal cues.   Skilled Therapeutic Interventions: Skilled treatment session focused on addressing cognitive goals. SLP facilitated session with scavenger hunt task that focused on addressing working memory with given list of items as well as problem solving with navigation and use of external aids for assist with Min assist verbal cues.  SLP also facilitated session with problem solving/scanning task; patient required Mod cues to utilize menu to answer questions regarding pricing and options.  Continue with current plan of care.         FIM:  Comprehension Comprehension Mode: Auditory Comprehension: 5-Understands basic 90% of the time/requires cueing < 10% of the time Expression Expression Mode: Verbal Expression: 5-Expresses basic 90% of the time/requires cueing < 10% of the time. Social Interaction Social Interaction: 5-Interacts appropriately 90% of the time - Needs monitoring or encouragement for participation or interaction. Problem Solving Problem Solving: 4-Solves basic 75 - 89% of the time/requires cueing 10 - 24% of the time Memory Memory: 4-Recognizes or recalls 75 - 89% of the time/requires cueing 10 - 24% of the time  Pain Pain Assessment Pain Assessment: Yes Pain Score: 8  Pain Type: Acute pain Pain Location: Ear Pain  Orientation: Left Pain Descriptors / Indicators: Aching Pain Frequency: Intermittent Pain Onset: On-going Patients Stated Pain Goal: 0 Pain Intervention(s): Medication (See eMAR) Multiple Pain Sites: No  Therapy/Group: Individual Therapy  Charlane FerrettiMelissa Teghan Philbin, M.A., CCC-SLP [phone]  Eric May 04/05/2013, 12:17 PM

## 2013-04-05 NOTE — Progress Notes (Signed)
Occupational Therapy Session Note  Patient Details  Name: Gerre Pebblesnthony Elizarraraz MRN: [id number] Date of Birth: 17-Dec-1960  Today's Date: 04/05/2013 Time: [id number] Time Calculation (min): 40 min  Short Term Goals: No short term goals set  Skilled Therapeutic Interventions/Progress Updates:    Engaged in ADL retraining with focus on functional mobility and transfers with RW and functional use of LUE during bathing and dressing. Ambulated with RW with Lt hand orthosis into bathroom to walk-in shower with min assist. Completed bathing at sit>stand level in shower, standing to wash buttocks. Increased functional use of LUE with washing and hand over hand to assist to incorporate LUE into pulling up pants. Close supervision in standing while pt pulled pants over hips. Pt attempted donning Lt sock this session without success, however donned Rt sock and shoe and attempted to tie shoe.  Pt very pleasant this session and reports trying to focus on positives and increase participation.   Therapy Documentation Precautions:  Precautions Precautions: Fall Precaution Comments: pt con't to be agitated at onset of pain Restrictions Weight Bearing Restrictions: No Pain: Pain Assessment Pain Assessment: 0-10 Pain Score: 2  Pain Type: Acute pain Pain Location: Ear Pain Orientation: Left Pain Descriptors / Indicators: Aching Pain Onset: On-going Pain Intervention(s): Medication (See eMAR) Multiple Pain Sites: No  See FIM for current functional status  Therapy/Group: Individual Therapy  [name], Yannis Gumbs 04/05/2013, 10:05 AM

## 2013-04-06 ENCOUNTER — Inpatient Hospital Stay (HOSPITAL_COMMUNITY): Payer: Medicaid Other | Admitting: Physical Therapy

## 2013-04-06 ENCOUNTER — Inpatient Hospital Stay (HOSPITAL_COMMUNITY): Payer: Medicaid Other | Admitting: Occupational Therapy

## 2013-04-06 ENCOUNTER — Inpatient Hospital Stay (HOSPITAL_COMMUNITY): Payer: Medicaid Other | Admitting: Speech Pathology

## 2013-04-06 NOTE — Progress Notes (Signed)
Recreational Therapy Session Note  Patient Details  Name: Eric May MRN: [id number] Date of Birth: 1960/04/28 Today's Date: 04/06/2013  Pain: c/o left ear pain, RN aware, premedicated Skilled Therapeutic Interventions/Progress Updates: Session 1:  Session focused on activity tolerance, functional mobility including ambulation with RW, ascending/descending 12 stairs x4 using rail, safety/awareness, memory, & problem solving.  Pt required min assist for ambulation & stairs, min verbal cues for safety with stairs.    Pt performed cognitive task of modified "Simon" game in quadruped with focus on left shoulder stabilization, memory, & problem solving for simple math.  Session 2:  Session focused on activity tolerance, ambulation using RW, standing balance, scanning to the left, and overall safety.  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  Alika Saladin 04/06/2013, 1:51 PM

## 2013-04-06 NOTE — Progress Notes (Signed)
Occupational Therapy Session Note  Patient Details  Name: Eric May MRN: [id number] Date of Birth: 20-May-1960  Today's Date: 04/06/2013 Time: [id number] and [id number] Time Calculation (min): 40 min and 45 min  Short Term Goals: No short term goals set  Skilled Therapeutic Interventions/Progress Updates:   1) Pt seen for ADL retraining with focus on functional mobility with RW, sit <> stand, and functional use of LUE during self-care tasks of bathing and dressing. Gathered clothing with RW with min assist with ambulation and when reaching outside BOS to obtain clothes from dresser drawer. Completed dressing with distant supervision for balance with pulling up pants in standing, however required assist to don and tie Lt shoe. Pt able to don both socks this session and was able to tie Rt shoe with increased time.   2) Engaged in therapeutic activity with focus on activity tolerance, ambulation using RW, standing balance, scanning to the left, and overall safety. 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 Pt with c/o pain in Lt ear and reports feeling "woozy" in standing and ambulation due to ear pain/pressure.  Therapy Documentation Precautions:  Precautions Precautions: Fall Precaution Comments: pt con't to be agitated at onset of pain Restrictions Weight Bearing Restrictions: No Pain: Pain Assessment Pain Assessment: No/denies pain Pain Score: 0-No pain Faces Pain Scale: Hurts little more Pain Type: Acute pain Pain Location: Ear Pain Orientation: Left Pain Descriptors / Indicators: Aching;Discomfort Pain Onset: On-going Pain Intervention(s): Other (Comment) (premedicated)  See FIM for current functional status  Therapy/Group: Individual Therapy  [name], Zell Hylton 04/06/2013, 12:03 PM

## 2013-04-06 NOTE — Progress Notes (Signed)
Speech Language Pathology Daily Session Note  Patient Details  Name: Eric May MRN: [id number] Date of Birth: 1960-09-19  Today's Date: 04/06/2013 Time: 1400-1455 Time Calculation (min): 55 min  Short Term Goals: Week 5: SLP Short Term Goal 1 (Week 5): Patient will select attention to task for 10 minutes with Min clinician cues. SLP Short Term Goal 2 (Week 5): Patient will solve moderately complex problems with Min verbal cues. SLP Short Term Goal 3 (Week 5): Patient will anticipate needs for discharge with Min question cues SLP Short Term Goal 4 (Week 5): Patient will self-monitor and correct verbal perseveration with Supervision level verbal cues.   Skilled Therapeutic Interventions: Skilled treatment session focused on addressing cognitive goals. SLP facilitated session with money management task that required patient to plan out meals based upon sale items and his budget.  Patient required Min verbal cues for accuracy of figures.  SLP also requested patient recall scavenger hunt locations from yesterday's session which patient did with increased wait time.  Patient reported pain was impacting his ability to concentrate and session was ended 5 minutes early.  Continue with current plan of care.         FIM:  Comprehension Comprehension Mode: Auditory Comprehension: 5-Follows basic conversation/direction: With extra time/assistive device Expression Expression Mode: Verbal Expression: 5-Expresses basic needs/ideas: With extra time/assistive device Social Interaction Social Interaction: 6-Interacts appropriately with others with medication or extra time (anti-anxiety, antidepressant). Problem Solving Problem Solving: 5-Solves basic 90% of the time/requires cueing < 10% of the time Memory Memory: 5-Recognizes or recalls 90% of the time/requires cueing < 10% of the time FIM - Eating Eating Activity: 7: Complete independence:no helper  Pain Pain Assessment Pain Assessment:  0-10 Pain Score: 6  Pain Type: Acute pain Pain Location: Ear Pain Orientation: Left Pain Descriptors / Indicators: Aching Pain Frequency: Intermittent Pain Onset: On-going Patients Stated Pain Goal: 3 Pain Intervention(s): Other (Comment) (RN aware and pre-medicated) Multiple Pain Sites: No  Therapy/Group: Individual Therapy  Charlane FerrettiMelissa Dorenda Pfannenstiel, M.A., CCC-SLP [phone]  Kataleyah Carducci 04/06/2013, 4:29 PM

## 2013-04-06 NOTE — Progress Notes (Signed)
53 y.o. right-handed male with history of hypertension, tobacco and alcohol abuse. Admitted 02/23/2013 when patient developed left-sided numbness and weakness while at work. Patient reports falling at that time due to weakness on the left. Subjective/Complaints: Left Ear pain, reduced hearing , hx of reduced hearing but no pain in past  no ear surgeries Review of Systems -No chest pain or SOB, no abd or rectal pain Objective: Vital Signs: Blood pressure 120/81, pulse 77, temperature 98.1 F (36.7 C), temperature source Oral, resp. rate 18, height 6' (1.829 m), weight 58.514 kg (129 lb), SpO2 98.00%. No results found. No results found for this or any previous visit (from the past 72 hour(s)).    Constitutional: He appears alert, no distress.  HENT: poor dentition, occlusive cerumenosis in Left ext canal, ext ear appears normal otherwise Head: Normocephalic. atraumatic Eyes:  Pupils EOMI, without nystagmus  Neck: Normal range of motion. Neck supple. No thyromegaly present.  Cardiovascular: Normal rate and regular rhythm. No murmurs  Respiratory: Effort normal and breath sounds normal. No respiratory distress. No wheezes, rales, rhonchi  GI: Soft. Bowel sounds are normal. He exhibits no distension.  Neurological:  . Oriented to name and hosp. He did follow one and two step commands.[name] 3-5 left shoulder ,grip was 3-/5. LLE was 4-/5 with HF and KE and trace at the ankle.  Speech moderately dysarthric.  RUE 4/5 RLE 5/5 Skin: Skin is warm and dry.  Psychiatric:  Flat,     Assessment/Plan: 1. 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 Physiatrist is providing close team supervision and 24 hour management of active medical problems listed below. Physiatrist and rehab team continue to assess barriers to discharge/monitor patient  progress toward functional and medical goals Team conference today please see physician documentation under team conference tab, met with team face-to-face to discuss problems,progress, and goals. Formulized individual treatment plan based on medical history, underlying problem and comorbidities. FIM: FIM - Bathing Bathing Steps Patient Completed: Chest;Left Arm;Abdomen;Front perineal area;Right upper leg;Left upper leg;Right Arm;Right lower leg (including foot);Left lower leg (including foot);Buttocks Bathing: 5: Supervision: Safety issues/verbal cues  FIM - Upper Body Dressing/Undressing Upper body dressing/undressing steps patient completed: Put head through opening of pull over shirt/dress;Thread/unthread right sleeve of pullover shirt/dresss;Pull shirt over trunk;Thread/unthread left sleeve of pullover shirt/dress Upper body dressing/undressing: 5: Set-up assist to: Obtain clothing/put away FIM - Lower Body Dressing/Undressing Lower body dressing/undressing steps patient completed: Thread/unthread right underwear leg;Thread/unthread left underwear leg;Pull underwear up/down;Thread/unthread right pants leg;Thread/unthread left pants leg;Pull pants up/down;Fasten/unfasten pants;Don/Doff right sock;Don/Doff right shoe Lower body dressing/undressing: 3: Mod-Patient completed 50-74% of tasks  FIM - Toileting Toileting steps completed by patient: Performs perineal hygiene;Adjust clothing after toileting;Adjust clothing prior to toileting Toileting Assistive Devices: Grab bar or rail for support Toileting: 5: Supervision: Safety issues/verbal cues  FIM - Radio producer Devices: Insurance account manager Transfers: 4-To toilet/BSC: Min A (steadying Pt. > 75%);4-From toilet/BSC: Min A (steadying Pt. > 75%)  FIM - Bed/Chair Transfer Bed/Chair Transfer Assistive Devices: Arm rests;Walker Bed/Chair Transfer: 5: Supine > Sit: Supervision (verbal cues/safety issues);5: Sit > Supine:  Supervision (verbal cues/safety issues);5: Bed > Chair or W/C: Supervision (verbal cues/safety issues);5: Chair or W/C > Bed: Supervision (verbal cues/safety issues)  FIM - Locomotion: Wheelchair Distance: 150 Locomotion: Wheelchair: 0: Activity did not occur FIM - Locomotion: Ambulation Locomotion: Ambulation Assistive Devices: Orthosis;Walker - Rolling Ambulation/Gait  Assistance: 4: Min guard Locomotion: Ambulation: 4: Travels 150 ft or more with minimal assistance (Pt.>75%)  Comprehension Comprehension Mode: Auditory Comprehension: 5-Understands basic 90% of the time/requires cueing < 10% of the time  Expression Expression Mode: Verbal Expression: 5-Expresses basic 90% of the time/requires cueing < 10% of the time.  Social Interaction Social Interaction: 5-Interacts appropriately 90% of the time - Needs monitoring or encouragement for participation or interaction.  Problem Solving Problem Solving: 4-Solves basic 75 - 89% of the time/requires cueing 10 - 24% of the time  Memory Memory: 4-Recognizes or recalls 75 - 89% of the time/requires cueing 10 - 24% of the time  Medical Problem List and Plan:  1. 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 DVT Prophylaxis/Anticoagulation: SCDs. Monitor for any signs of DVT  3. Pain Management: Tylenol as needed. 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 Neuropsych: This patient is not capable of making decisions on his own behalf.  5. Seizure prophylaxis. Keppra 500 mg twice a day. Monitor for any seizure activity  6. Hypertension. Norvasc 10 mg daily,D/C lisinopril 2.5, propranolol 45m TID will increase to 260m                             7. Dysphagia.. Marland Kitchenysphagia 3 thin liquids. Followup speech therapy ,appetitie improved off  megace ,cont Remeron 8. History of tobacco alcohol abuse.  Urine drug screen positive cocaine and marijuana.  9.  Rectal pain,resolved 10.  Mood- improved on remeron 11.  Ear pain Left , wax visible in ext canal , debrox 5gtt Left ear BID for 3 days     LOS (Days) 35 A FACE TO FACE EVALUATION WAS PERFORMED  KIRSTEINS,ANDREW E 04/06/2013, 7:03 AM

## 2013-04-06 NOTE — Patient Care Conference (Signed)
Inpatient RehabilitationTeam Conference and Plan of Care Update Date: 04/06/2013   Time: 11:45 AM    Patient Name: Eric May      Medical Record Number: [id number]  Date of Birth: Apr 04, 1960 Sex: Male         Room/Bed: 4W11C/4W11C-01 Payor Info: Payor: MEDICAID PENDING / Plan: MEDICAID PENDING / Product Type: *No Product type* /    Admitting Diagnosis: ICH  Admit Date/Time:  03/02/2013  6:21 PM Admission Comments: No comment available   Primary Diagnosis:  <principal problem not specified> Principal Problem: <principal problem not specified>  Patient Active Problem List   Diagnosis Date Noted  . ICH (intracerebral hemorrhage) 03/02/2013  . Convulsions/seizures 02/24/2013  . Intracerebral hemorrhage 02/23/2013    Expected Discharge Date:    Team Members Present: Physician leading conference: Dr. Alysia Penna Social Worker Present: Ovidio Kin, LCSW;Jenny Prevatt, LCSW Nurse Present: Other (comment) Loree Fee Reardon-RN) PT Present: Raylene Everts, Jimmie Molly, PT OT Present: Simonne Come, OT;Kris Nira Retort, OT SLP Present: Gunnar Fusi, SLP PPS Coordinator present : Daiva Nakayama, RN, CRRN     Current Status/Progress Goal Weekly Team Focus  Medical   improved mobility, Left ear pain  maintain stability  Remove wax left ear   Bowel/Bladder   Continent of bowel and bladder. LBM 04/04/2013  Remains cont of B&B. Urinal at [facility].  Up to the bathroom during the day. Urinal at [facility]   Swallow/Nutrition/ Hydration   regular and thin with intermittent supervision   least restrictive p.o. intake   goals met   ADL's   min assist toilet and shower transfers with RW, supervision bathing and UB dressing, mod assist LB dressing, LUE as gross assist  supervision-min assist  LUE NM re-ed, dynamic balance during self-care tasks   Mobility   Supervision transfers and w/c mobility, supervision-min A gait and stairs, min-mod A floor transfer  Goals upgraded; supervision  transfers and w/c mobility, supervision gait, min A stairs, car transfer and floor transfer  Higher level dynamic balance and gait training without RW, shoulder/UE functional training   Communication   Supervision   Supervision   goals met   Safety/Cognition/ Behavioral Observations  Min-Mod assist   Min assist   increase left attention, awarenss, recall and use of taught compensatory strategies   Pain   Pain managed with Tyl and Ultram. Ultram given at 0626 for c/o left ear pain.  Pain managed with prn meds  Assess pain q 4 hrs; assess effectiveness of pain meds   Skin   Skin's intact  No new skin breakdown or infection  Assess skin q shift      *See Care Plan and progress notes for long and short-term goals.  Barriers to Discharge: limited post d/c    Possible Resolutions to Barriers:  SNF vs Rest home, vs home    Discharge Planning/Teaching Needs:  [facility] looking for bed, girlfriend still talking aobut taking him home once medicaid is approved.  Pt is aware of the plan and wants to go with girlfriend      Team Discussion:  Pt continues to make progress in therapies.  Ear wax issue MD working on.  Will update FL2 and re-send out moving toward [facility] level  Revisions to Treatment Plan:  Upgraded goals to supervision with ambulation with rolling walker   Continued Need for Acute Rehabilitation Level of Care: The patient requires daily medical management by a physician with specialized training in physical medicine and rehabilitation for the following conditions: Daily direction  of a multidisciplinary physical rehabilitation program to ensure safe treatment while eliciting the highest outcome that is of practical value to the patient.: Yes Daily medical management of patient stability for increased activity during participation in an intensive rehabilitation regime.: Yes Daily analysis of laboratory values and/or radiology reports with any subsequent need for medication adjustment of medical  intervention for : Neurological problems  Koltin Wehmeyer, [name] 04/06/2013, 2:32 PM

## 2013-04-06 NOTE — Progress Notes (Signed)
Physical Therapy Weekly Progress Note  Patient Details  Name: Eric May MRN: [id number] Date of Birth: 10-May-1960  Today's Date: 04/06/2013 Time: 7939-0300 Time Calculation (min): 42 min  Patient continues to make good progress and has met 5 of 11 long term goals.  Pt is currently supervision bed mobility, bed <> w/c transfers, gait with RW and orthosis, min A stairs and floor transfer and for gait without AD.  Pt D/C disposition still undetermined pending Medicaid approval; pt to either D/C home with girlfriend vs. SNF placement.    Patient continues to demonstrate the following deficits: L hemiparesis, impaired sensation and proprioception, impaired motor control, timing/sequencing and coordination, mild L inattention, impaired balance, gait, cognition and therefore will continue to benefit from skilled PT intervention to enhance overall performance with balance, postural control, ability to compensate for deficits, functional use of  left upper extremity and left lower extremity, attention, awareness and coordination.  See Patient's Care Plan for progression toward long term goals.  Patient progressing toward long term goals.  Continue plan of care.  Skilled Therapeutic Interventions/Progress Updates:   Upon entering room pt in w/c but reporting pain in L ear.  Pt reporting being premedicated but willing to participate in therapy.  Performed gait x 150' with RW and min A with improved bilat step and stride length.  Performed stair negotiation training up/down 12 stairs x 4 reps with one UE support on hand rail and min A; during first step pt attempted to ascend with LLE but unable to fully extend LLE; pt demonstrated emergent awareness of error and self corrected step to sequence for rest of stairs.  Pt also able to verbalize and demonstrate safe descending step to sequence.  Returned to gym with min A and verbal cues for use of RUE to open doors. Performed NMR; see below for details.   Returned to room with min A and transferred to toilet with min HHA; pt doffed clothes with min A and pt left on toilet to have BM; nursing staff aware.       Therapy Documentation Precautions:  Precautions Precautions: Fall Precaution Comments: pt con't to be agitated at onset of pain Restrictions Weight Bearing Restrictions: No Pain: Pain Assessment Pain Assessment: No/denies pain Pain Score: 0-No pain Faces Pain Scale: Hurts little more Pain Type: Acute pain Pain Location: Ear Pain Orientation: Left Pain Descriptors / Indicators: Aching;Discomfort Pain Onset: On-going Pain Intervention(s): Other (Comment) (premedicated) Locomotion : Ambulation Ambulation/Gait Assistance: 4: Min guard  Other Treatments: Treatments Neuromuscular Facilitation: Left;Lower Extremity;Upper Extremity;Activity to increase motor control;Activity to increase timing and sequencing;Activity to increase sustained activation;Activity to increase lateral weight shifting;Activity to increase anterior-posterior weight shifting in quadruped on mat with focus on weight shifting and weight acceptance and training of L shoulder girdle stabilization while RUE performed number tapping in various directions on mat beginning with repetition of pattern progressing to use of number taps to complete basic mathematical calculations (ex. Tap the numbers that would equal 27).  No c/o pain in shoulder during quadruped. Pt also reporting taping is "holding up".    See FIM for current functional status  Therapy/Group: Individual Therapy and Co-treat with Recreation therapy  Raylene Everts [facility] 04/06/2013, 11:48 AM

## 2013-04-07 ENCOUNTER — Inpatient Hospital Stay (HOSPITAL_COMMUNITY): Payer: Medicaid Other

## 2013-04-07 ENCOUNTER — Inpatient Hospital Stay (HOSPITAL_COMMUNITY): Payer: Medicaid Other | Admitting: Occupational Therapy

## 2013-04-07 ENCOUNTER — Inpatient Hospital Stay (HOSPITAL_COMMUNITY): Payer: Self-pay | Admitting: *Deleted

## 2013-04-07 ENCOUNTER — Inpatient Hospital Stay (HOSPITAL_COMMUNITY): Payer: Self-pay | Admitting: Speech Pathology

## 2013-04-07 MED ORDER — CARBAMIDE PEROXIDE 6.5 % OT SOLN
5.0000 [drp] | Freq: Two times a day (BID) | OTIC | Status: AC
  Administered 2013-04-07 – 2013-04-09 (×6): 5 [drp] via OTIC
  Filled 2013-04-07: qty 15

## 2013-04-07 MED ORDER — NICOTINE 14 MG/24HR TD PT24
14.0000 mg | MEDICATED_PATCH | Freq: Every day | TRANSDERMAL | Status: DC
  Administered 2013-04-07 – 2013-04-14 (×8): 14 mg via TRANSDERMAL
  Filled 2013-04-07 (×9): qty 1

## 2013-04-07 NOTE — Progress Notes (Signed)
53 y.o. right-handed male with history of hypertension, tobacco and alcohol abuse. Admitted 02/23/2013 when patient developed left-sided numbness and weakness while at work. Patient reports falling at that time due to weakness on the left. Subjective/Complaints: Left Ear pain improving, reduced hearing , hx of reduced hearing but no pain in past  no ear surgeries Review of Systems -No chest pain or SOB, no abd or rectal pain Objective: Vital Signs: Blood pressure 132/92, pulse 77, temperature 97 F (36.1 C), temperature source Oral, resp. rate 16, height 6' (1.829 m), weight 62.234 kg (137 lb 3.2 oz), SpO2 100.00%. No results found. No results found for this or any previous visit (from the past 72 hour(s)).    Constitutional: He appears alert, no distress.  HENT: poor dentition, occlusive cerumenosis in Left ext canal, ext ear appears normal otherwise Head: Normocephalic. atraumatic Eyes:  Pupils EOMI, without nystagmus  Neck: Normal range of motion. Neck supple. No thyromegaly present.  Cardiovascular: Normal rate and regular rhythm. No murmurs  Respiratory: Effort normal and breath sounds normal. No respiratory distress. No wheezes, rales, rhonchi  GI: Soft. Bowel sounds are normal. He exhibits no distension.  Neurological:  . Oriented to name and hosp. He did follow one and two step commands.[name]. 3-5 left shoulder ,grip was 3-/5. LLE was 4-/5 with HF and KE and trace at the ankle.  Speech moderately dysarthric.  RUE 4/5 RLE 5/5 Skin: Skin is warm and dry.  Psychiatric:  Flat,     Assessment/Plan: 1. 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 Physiatrist is providing close team supervision and 24 hour management of active medical problems listed below. Physiatrist and rehab team continue to assess barriers to  discharge/monitor patient progress toward functional and medical goals . FIM: FIM - Bathing Bathing Steps Patient Completed: Chest;Left Arm;Abdomen;Front perineal area;Right upper leg;Left upper leg;Right Arm;Right lower leg (including foot);Left lower leg (including foot);Buttocks Bathing: 5: Supervision: Safety issues/verbal cues  FIM - Upper Body Dressing/Undressing Upper body dressing/undressing steps patient completed: Put head through opening of pull over shirt/dress;Thread/unthread right sleeve of pullover shirt/dresss;Pull shirt over trunk;Thread/unthread left sleeve of pullover shirt/dress Upper body dressing/undressing: 5: Set-up assist to: Obtain clothing/put away FIM - Lower Body Dressing/Undressing Lower body dressing/undressing steps patient completed: Thread/unthread right underwear leg;Thread/unthread left underwear leg;Pull underwear up/down;Thread/unthread right pants leg;Thread/unthread left pants leg;Pull pants up/down;Don/Doff right sock;Don/Doff right shoe;Don/Doff left sock;Fasten/unfasten right shoe Lower body dressing/undressing: 4: Min-Patient completed 75 plus % of tasks  FIM - Toileting Toileting steps completed by patient: Adjust clothing prior to toileting;Adjust clothing after toileting;Performs perineal hygiene Toileting Assistive Devices: Grab bar or rail for support Toileting: 5: Supervision: Safety issues/verbal cues  FIM - Diplomatic Services operational officerToilet Transfers Toilet Transfers Assistive Devices: Art gallery managerWalker Toilet Transfers: 4-To toilet/BSC: Min A (steadying Pt. > 75%);4-From toilet/BSC: Min A (steadying Pt. > 75%)  FIM - Bed/Chair Transfer Bed/Chair Transfer Assistive Devices: Arm rests;Walker Bed/Chair Transfer: 5: Supine > Sit: Supervision (verbal cues/safety issues);5: Sit > Supine: Supervision (verbal cues/safety issues);5: Bed > Chair or W/C: Supervision (verbal cues/safety issues);5: Chair or W/C > Bed: Supervision (verbal cues/safety issues)  FIM - Locomotion:  Wheelchair Distance: 150 Locomotion: Wheelchair: 0: Activity did not occur FIM - Locomotion: Ambulation Locomotion: Ambulation Assistive Devices: Orthosis;Walker - Rolling Ambulation/Gait Assistance: 4: Min guard Locomotion: Ambulation: 4: Travels 150 ft or more with minimal assistance (Pt.>75%)  Comprehension Comprehension Mode: Auditory Comprehension: 5-Follows  basic conversation/direction: With extra time/assistive device  Expression Expression Mode: Verbal Expression: 5-Expresses basic needs/ideas: With extra time/assistive device  Social Interaction Social Interaction: 6-Interacts appropriately with others with medication or extra time (anti-anxiety, antidepressant).  Problem Solving Problem Solving: 5-Solves basic 90% of the time/requires cueing < 10% of the time  Memory Memory: 5-Recognizes or recalls 90% of the time/requires cueing < 10% of the time  Medical Problem List and Plan:  1. 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 DVT Prophylaxis/Anticoagulation: SCDs. Monitor for any signs of DVT  3. Pain Management: Tylenol as needed. 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 Neuropsych: This patient is not capable of making decisions on his own behalf.  5. Seizure prophylaxis. Keppra 500 mg twice a day. Monitor for any seizure activity  6. Hypertension. Norvasc 10 mg daily,D/C lisinopril 2.5, propranolol 10mg  TID will increase to 20mg                               7. Dysphagia.[name] dysphagia 3 thin liquids. Followup speech therapy ,appetitie improved off  megace ,cont Remeron 8. History of tobacco alcohol abuse. Urine drug screen positive cocaine and marijuana. Will reduce dose of nic patch 9.  Rectal pain,resolved 10.  Mood- improved on remeron 11.  Ear pain Left , wax visible in ext canal , debrox 5gtt Left ear BID for 3  days     LOS (Days) 36 A FACE TO FACE EVALUATION WAS PERFORMED   Grettel Rames E 04/07/2013, 6:54 AM

## 2013-04-07 NOTE — Progress Notes (Signed)
Recreational Therapy Session Note  Patient Details  Name: Eric May MRN: [id number] Date of Birth: [birth year] Today's Date: 04/07/2013  Pain: c/o earache, RN aware Skilled Therapeutic Interventions/Progress Updates: Pt missed 30 minute session due to earache & intermittent dizziness  Alexes Lamarque 04/07/2013, 3:25 PM

## 2013-04-07 NOTE — Progress Notes (Signed)
Occupational Therapy Session Note  Patient Details  Name: Eric May MRN: [id number] Date of Birth: [birth year]  Today's Date: 04/07/2013 Time: 0830-0900 Time Calculation (min): 30 min  Short Term Goals: No short term goals set  Skilled Therapeutic Interventions/Progress Updates:    Pt seen for ADL retraining with focus to be on shower transfer and bathing at shower level.  Pt asleep upon arrival, but easily awakened.  Pt with c/o pain in Lt ear but willing to attempt shower.  Ambulated to bathroom with RW with close supervision, urinated in standing.  While voiding pt reports feeling "funny" and "woozy", required min assist to ambulate back to bed.  Pt donned shorts with steady assist to stand to pull over hips.  BP taken, see below, RN notified of BP and pt's reports of "woozy" and feeling overheated.  RN present to assess and provide meds.  Pt requested to return to bed as still not feeling well after cold washcloth and water.  Therapy Documentation Precautions:  Precautions Precautions: Fall Precaution Comments: pt con't to be agitated at onset of pain Restrictions Weight Bearing Restrictions: No General: General Amount of Missed OT Time (min): 15 Minutes Vital Signs: Therapy Vitals Temp: 97 F (36.1 C) Temp src: Oral Pulse Rate: 77 Resp: 16 BP: 136/92 mmHg Patient Position, if appropriate: Lying Oxygen Therapy SpO2: 100 % O2 Device: None (Room air) Pain:  Pt with c/o pain in Lt ear, reports feeling woozy and unsteady with ambulation  See FIM for current functional status  Therapy/Group: Individual Therapy  [name], Chermaine Schnyder 04/07/2013, 9:11 AM

## 2013-04-07 NOTE — Progress Notes (Signed)
Social Work Patient ID: Eric May, male   DOB: 02-14-1961, 53 y.o.   MRN: [id number] Spoke with [name]-girlfriend and pt to inform of team conference and him progressing to the point he is going to be =[facility] Home level if he reaches supervision level. He is not feeling well today and is requiring more care.  Have contacted [place]-SSD worker to see if providing information from the hospital can help with his  SSD/MA determination.   [name] wants his Medicaid approved before she would consider taking him home.  Informed her of the unlikilhood of this happening while here. Will update pt's FL2 and re-send it.   Continue to work on pt's discharge plan.

## 2013-04-07 NOTE — Progress Notes (Signed)
Physical Therapy Note  Patient Details  Name: Eric May MRN: [id number] Date of Birth: Jan 03, 1961 Today's Date: 04/07/2013  2:00 - 2:45  45 minutes Individual session Patient missed 15 minutes due to complaints of ear ache and dizziness.  Patient sitting up in bed upon entering room. Patient explained how he had not been feeling well today. Willing to give it a try. Patient supine to sit with HOB elevated and transfer stand pivot bed to wheelchair with supervision. Patient pushed in wheelchair to dayroom. Patient ambulated 25 feet and stood x 5 minutes to select card for mom and girlfriend. Patient sat to address, sign, and stuff cards. Patient transferred wheelchair <> Nustep with supervision. Patient exercised using all 4 extremities x 5 minutes on workload 4. Patient reported still not feeling well and requested to return to room. Patient transferred wheelchair to bed and sit to supine with supervision. Patient left in bed with all items in reach, 3 side rails up and alarm set.  [name], [name] 04/07/2013, 2:51 PM

## 2013-04-07 NOTE — Progress Notes (Signed)
SLP Cancellation Note  Patient Details Name: Eric May MRN: [id number] DOB: 1960/09/24   Cancelled treatment:       Amount of Missed SLP Time (min): 45 Minutes                                                                                          Missed Time Reason: Patient ill (Comment) (question vestibular issues).  Patient reports feeling dizzy when he moves and requested to rest in bed.  SLP will follow up tomorrow.  Fae PippinMelissa Nelda Luckey, M.A., CCC-SLP [phone]  Ples Trudel 04/07/2013, 9:28 AM

## 2013-04-08 ENCOUNTER — Inpatient Hospital Stay (HOSPITAL_COMMUNITY): Payer: Medicaid Other | Admitting: *Deleted

## 2013-04-08 ENCOUNTER — Inpatient Hospital Stay (HOSPITAL_COMMUNITY): Payer: Medicaid Other | Admitting: Speech Pathology

## 2013-04-08 ENCOUNTER — Encounter (HOSPITAL_COMMUNITY): Payer: Self-pay | Admitting: Occupational Therapy

## 2013-04-08 DIAGNOSIS — I619 Nontraumatic intracerebral hemorrhage, unspecified: Secondary | ICD-10-CM

## 2013-04-08 DIAGNOSIS — R209 Unspecified disturbances of skin sensation: Secondary | ICD-10-CM

## 2013-04-08 DIAGNOSIS — I69998 Other sequelae following unspecified cerebrovascular disease: Secondary | ICD-10-CM

## 2013-04-08 DIAGNOSIS — G8110 Spastic hemiplegia affecting unspecified side: Secondary | ICD-10-CM

## 2013-04-08 NOTE — Progress Notes (Signed)
53 y.o. right-handed male with history of hypertension, tobacco and alcohol abuse. Admitted 02/23/2013 when patient developed left-sided numbness and weakness while at work. Patient reports falling at that time due to weakness on the left. Subjective/Complaints: Left Ear pain improving, reduced hearing , hx of reduced hearing but no pain in past  no ear surgeries Review of Systems -No chest pain or SOB, no abd or rectal pain Objective: Vital Signs: Blood pressure 122/80, pulse 71, temperature 97.8 F (36.6 C), temperature source Oral, resp. rate 17, height 6' (1.829 m), weight 62.234 kg (137 lb 3.2 oz), SpO2 99.00%. No results found. No results found for this or any previous visit (from the past 72 hour(s)).    Constitutional: He appears alert, no distress.  HENT: poor dentition,  cerumenosis in Left ext canal 5mm oval ball of cerumen removed with Q tip from ext canal, ext ear appears normal otherwise, unable to visualize TM yet Head: Normocephalic. atraumatic Eyes:  Pupils EOMI, without nystagmus  Neck: Normal range of motion. Neck supple. No thyromegaly present.  Cardiovascular: Normal rate and regular rhythm. No murmurs  Respiratory: Effort normal and breath sounds normal. No respiratory distress. No wheezes, rales, rhonchi  GI: Soft. Bowel sounds are normal. He exhibits no distension.  Neurological:  . Oriented to name and hosp. He did follow one and two step commands.[name]. 3-5 left shoulder ,grip was 3-/5. LLE was 4-/5 with HF and KE and trace at the ankle.  Speech moderately dysarthric.  RUE 4/5 RLE 5/5 Skin: Skin is warm and dry.  Psychiatric:  Flat,     Assessment/Plan: 1. 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 Physiatrist is providing close team supervision and 24 hour management of active medical problems listed  below. Physiatrist and rehab team continue to assess barriers to discharge/monitor patient progress toward functional and medical goals . FIM: FIM - Bathing Bathing Steps Patient Completed: Chest;Left Arm;Abdomen;Front perineal area;Right upper leg;Left upper leg;Right Arm;Right lower leg (including foot);Left lower leg (including foot);Buttocks Bathing: 5: Supervision: Safety issues/verbal cues  FIM - Upper Body Dressing/Undressing Upper body dressing/undressing steps patient completed: Put head through opening of pull over shirt/dress;Thread/unthread right sleeve of pullover shirt/dresss;Pull shirt over trunk;Thread/unthread left sleeve of pullover shirt/dress Upper body dressing/undressing: 5: Set-up assist to: Obtain clothing/put away FIM - Lower Body Dressing/Undressing Lower body dressing/undressing steps patient completed: Thread/unthread right pants leg;Thread/unthread left pants leg;Pull pants up/down Lower body dressing/undressing: 4: Steadying Assist  FIM - Toileting Toileting steps completed by patient: Adjust clothing prior to toileting;Adjust clothing after toileting;Performs perineal hygiene Toileting Assistive Devices: Grab bar or rail for support Toileting: 5: Supervision: Safety issues/verbal cues  FIM - Diplomatic Services operational officerToilet Transfers Toilet Transfers Assistive Devices: Art gallery managerWalker Toilet Transfers: 5-To toilet/BSC: Supervision (verbal cues/safety issues);4-From toilet/BSC: Min A (steadying Pt. > 75%)  FIM - Bed/Chair Transfer Bed/Chair Transfer Assistive Devices: Arm rests;Walker Bed/Chair Transfer: 5: Supine > Sit: Supervision (verbal cues/safety issues);5: Sit > Supine: Supervision (verbal cues/safety issues);5: Bed > Chair or W/C: Supervision (verbal cues/safety issues);5: Chair or W/C > Bed: Supervision (verbal cues/safety issues)  FIM - Locomotion: Wheelchair Distance: 150 Locomotion: Wheelchair: 0: Activity did not occur FIM - Locomotion: Ambulation Locomotion: Ambulation  Assistive Devices: Orthosis;Walker - Rolling Ambulation/Gait Assistance: 4: Min guard Locomotion: Ambulation: 4: Travels 150 ft or more with minimal assistance (Pt.>75%)  Comprehension Comprehension Mode: Auditory Comprehension: 5-Follows basic conversation/direction: With extra time/assistive device  Expression Expression Mode: Verbal Expression: 5-Expresses basic needs/ideas: With extra time/assistive device  Social Interaction Social Interaction: 6-Interacts appropriately with others with medication or extra time (anti-anxiety, antidepressant).  Problem Solving Problem Solving: 5-Solves basic 90% of the time/requires cueing < 10% of the time  Memory Memory: 5-Recognizes or recalls 90% of the time/requires cueing < 10% of the time  Medical Problem List and Plan:  1. 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 DVT Prophylaxis/Anticoagulation: SCDs. Monitor for any signs of DVT  3. Pain Management: Tylenol as needed. 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 Neuropsych: This patient is not capable of making decisions on his own behalf.  5. Seizure prophylaxis. Keppra 500 mg twice a day. Monitor for any seizure activity  6. Hypertension. Norvasc 10 mg daily,D/C lisinopril 2.5, propranolol 10mg  TID will increase to 20mg                               7. Dysphagia.[name] dysphagia 3 thin liquids. Followup speech therapy ,appetitie improved off  megace ,cont Remeron 8. History of tobacco alcohol abuse. Urine drug screen positive cocaine and marijuana. Will reduce dose of nic patch 9.  Rectal pain,resolved 10.  Mood- improved on remeron 11.  Ear pain Left , wax visible in ext canal , debrox 5gtt Left ear BID for additional 3 days     LOS (Days) 37 A FACE TO FACE EVALUATION WAS PERFORMED   KIRSTEINS,ANDREW E 04/08/2013, 6:28 AM

## 2013-04-08 NOTE — Progress Notes (Signed)
Occupational Therapy Weekly Progress Note  Patient Details  Name: Eric May MRN: [id number] Date of Birth: 11-11-1960  Today's Date: 04/08/2013 Time: [id number] Time Calculation (min): 50 min  Short term goals not set due to estimated length of stay.  Pt making steady progress towards goals with pt at supervision overall with RW with Lt hand splint, supervision with self-care tasks at sit > stand level, and min assist with LB dressing due to requiring assist to don and tie Lt shoe with AFO.  Pt much more pleasant and motivated to participate in treatment sessions.  Pt with Lt ear ache due to excess earwax with RN and MD addressing, this has caused some intermittent disturbance with his balance to which he requires occasional min assist with ambulation.  Pt D/C disposition still to be determined pending Medicaid approval; pt to either D/C home with girlfriend vs. SNF placement   Patient continues to demonstrate the following deficits: Lt hemiparesis, impaired sensation and proprioception, impaired motor control, timing/sequencing and coordination, mild Lt inattention, impaired balance, cognition and therefore will continue to benefit from skilled OT intervention to enhance overall performance with BADL and Reduce care partner burden.  See Patient's Care Plan for progression toward long term goals.  Patient progressing toward long term goals..  Continue plan of care.  Skilled Therapeutic Interventions/Progress Updates:    Pt seen for ADL retraining with focus on functional mobility with RW, sit <> stand, and functional use of LUE during self-care tasks of bathing and dressing. Gathered clothing with RW with min assist with ambulation and when reaching outside BOS to obtain clothes from dresser drawer. Bathing completed at sit > stand level in room shower with use of LUE approx 40% of time. Completed dressing with distant supervision for balance with pulling up pants in standing, however required  assist to don and tie Lt shoe. Pt able to don both socks this session and was able to tie Rt shoe with increased time.   Therapy Documentation Precautions:  Precautions Precautions: Fall Precaution Comments: pt con't to be agitated at onset of pain Restrictions Weight Bearing Restrictions: No General:   Vital Signs: Therapy Vitals Temp: 97.8 F (36.6 C) Temp src: Oral Pulse Rate: 71 Resp: 17 BP: 122/80 mmHg Patient Position, if appropriate: Lying Oxygen Therapy SpO2: 99 % O2 Device: None (Room air) Pain: Pain Assessment Pain Assessment: 0-10 Pain Score: 10-Worst pain ever Pain Type: Acute pain Pain Location: Ear Pain Orientation: Left Pain Onset: On-going Pain Intervention(s): Medication (See eMAR)  See FIM for current functional status  Therapy/Group: Individual Therapy  Eric May, Eric May 04/08/2013, 9:24 AM

## 2013-04-08 NOTE — Progress Notes (Signed)
Speech Language Pathology Weekly Progress and Session Note  Patient Details  Name: Eric May MRN: [id number] Date of Birth: May 14, 1960  Today's Date: 04/08/2013 Time: 0930-1030 Time Calculation (min): 60 min  Short Term Goals: Week 5: SLP Short Term Goal 1 (Week 5): Patient will select attention to task for 10 minutes with Min clinician cues. SLP Short Term Goal 1 - Progress (Week 5): Met SLP Short Term Goal 2 (Week 5): Patient will solve moderately complex problems with Min verbal cues. SLP Short Term Goal 2 - Progress (Week 5): Met SLP Short Term Goal 3 (Week 5): Patient will anticipate needs for discharge with Min question cues SLP Short Term Goal 3 - Progress (Week 5): Met SLP Short Term Goal 4 (Week 5): Patient will self-monitor and correct verbal perseveration with Supervision level verbal cues.  SLP Short Term Goal 4 - Progress (Week 5): Progressing toward goal    New Short Term Goals: Week 6: SLP Short Term Goal 1 (Week 6): Patient will alternate attention to task for 10 minutes with Min clinician cues. SLP Short Term Goal 2 (Week 6): Patient will solve complex problems with Min verbal cues. SLP Short Term Goal 3 (Week 6): Patient will anticipate needs for discharge with Supervision level question cues SLP Short Term Goal 4 (Week 6): Patient will self-monitor and correct verbal perseveration with Supervision level verbal cues.   Weekly Progress Updates: Patient met 3 out of 4 short term goals this reporting period despite impact of ear ache and a missed session.  Patient made gains in anticipatory awareness, ability to solve moderately complex problems and ability to select attention during completion of therapy tasks.  Goal for monitoring and correcting verbal perseverations was not addressed due to hearing issues, but has decreased as other cognitive deficits resolve.  This reporting period will maximize skills for discharge such as medication management and education; as a  result, it is recommended that this patient continue to receive skilled SLP services to maximize functional independence and reduce burden of care prior to discharge.     Intensity: Minumum of 1-2 x/day, 30 to 90 minutes Frequency: 5 out of 7 days Duration/Length of Stay: TBD  Treatment/Interventions: Cognitive remediation/compensation;Cueing hierarchy;Environmental controls;Functional tasks;Internal/external aids;Patient/family education;Therapeutic Activities;Medication managment   Daily Session  Skilled Therapeutic Interventions: Skilled treatment session focused on addressing cognitive goals.  SLP introduced medication list for patient to utilize for recall of medications, functions and frequency.  Prior to use of external aid patient required Max verbal cues to recall information and following implementation of this tool patient required Min verbal cues to use accurately.  SLP also initiated education regarding use of medication box and patient loaded accurately with Max instructional cues for problem solving new complex task and Supervision level verbal cues to attend to left throughout task.          FIM:  Comprehension Comprehension Mode: Auditory Comprehension: 5-Follows basic conversation/direction: With extra time/assistive device Expression Expression Mode: Verbal Expression: 5-Expresses complex 90% of the time/cues < 10% of the time Social Interaction Social Interaction: 6-Interacts appropriately with others with medication or extra time (anti-anxiety, antidepressant). Problem Solving Problem Solving: 5-Solves basic problems: With no assist Memory Memory: 5-Recognizes or recalls 90% of the time/requires cueing < 10% of the time Pain Pain Assessment Pain Assessment: No/denies pain  Therapy/Group: Individual Therapy  Carmelia Roller., CCC-SLP [phone]  [place] 04/08/2013, 1:08 PM

## 2013-04-08 NOTE — Progress Notes (Signed)
Social Work Patient ID: Eric May, male   DOB: 1960-06-21, 53 y.o.   MRN: [id number] Spoke with dawn to discuss pt's level and ready for discharge.  Discussed looking in [place] at [facility], since may take with medicaid pending. She wants to take to her home, but concerned about making sure he can get his medicines.  Informed can get one month of medicines and then to work on obtaining [employer] card until his Medicaid comes through.  Have contacted [facility] in [place] regarding getting pt connected with their services.  [name] is aware infomration sent to Disability Worker in [place], but still unsure of the timeline when his disability and Medicaid would be approved.  Will work on discharge, hopefully for next week.

## 2013-04-08 NOTE — Progress Notes (Addendum)
Physical Therapy Session Note  Patient Details  Name: Eric May MRN: [id number] Date of Birth: [birth year]  Today's Date: 04/08/2013 Time: 13:05-14:00 (55min)   Skilled Therapeutic Interventions/Progress Updates:  Tx focused on L-sided attention, pathfinding, and NMR for dynamic balance during fucntional mobility in community setting. Pt engaged in community gait and mobility 1x>500' with RW with hand orthosis and close S over varying and uneven surfaces. Pt needed mod cues for pahtfinding and safety with elevator management, but had no fatigue throughout.  Pt able to navigate busy settings and tight spaces with S. Pt needed gait training cues for step length and L stance control.   Nustep x10 min level 5 with bil UE and LE without rest needed, until after.   Trialed SPC in controlled setting x30' with min A and demonstration with cues for technique, but pt still more comfortable with RW at this time.   Pt reporting ongoing ear ache, premedicated. Instructed pt in breathing relaxation exercise for inhalation x 5sec and exhalation x5sec for assisting in ear pain relief. Pt receptive to learning and able to follow demonstration cues.   Left up in chair with all needs in reach.       Therapy Documentation Precautions:  Precautions Precautions: Fall Precaution Comments: pt con't to be agitated at onset of pain Restrictions Weight Bearing Restrictions: No   Vital Signs: Therapy Vitals BP: 134/94 mmHg Pain: Pain Assessment Pain Assessment: No/denies pain   Locomotion : Ambulation Ambulation/Gait Assistance: 5: Supervision  :    See FIM for current functional status  Therapy/Group: Individual Therapy Clydene Lamingole Jahkeem Kurka, PT, DPT  04/08/2013, 1:48 PM

## 2013-04-09 ENCOUNTER — Ambulatory Visit (HOSPITAL_COMMUNITY): Payer: Self-pay | Admitting: Physical Therapy

## 2013-04-09 NOTE — Progress Notes (Addendum)
Physical Therapy Session Note  Patient Details  Name: Eric May MRN: [id number] Date of Birth: June 07, 1960  Today's Date: 04/09/2013 Time: 1400-1505 Time Calculation (min): 65 min  Skilled Therapeutic Interventions/Progress Updates:    Pt participated in 5 stations during Stride Right Walking Group Therapy Session:  1] Obstacle Course: navigating cones, stepping over obstacles, stairs x 5 with bil railings, ambulation over compliant surface and controlled tiled surface with RW, Lt hand orthosis, and Lt AFO and supervision/min-guard assist overall. Min verbal cues for safety with RW management. Good recall of LE sequencing on steps.  2] Standing balance with decreased UE support while rolling bowling ball at pins 3] NuStep level 4 x 10 min (+intermittent rest breaks as needed) 4] Sit<> stands x 10 reps, rest and repeat. 5] Seated LE resisted exercises with 5# ankle weights: marching and long arc quads alternating 10 reps each.   Pt performed bathroom and room ambulation at end of session with RW and orthotics listed above, supervision.   Therapy Documentation Precautions:  Precautions Precautions: Fall Precaution Comments: pt con't to be agitated at onset of pain Restrictions Weight Bearing Restrictions: No Pain: Reports ear pain - RN made aware and brought medication at end of session   See FIM for current functional status  Therapy/Group: Group Therapy  Wilhemina BonitoGillispie, Shamere Campas Cheek 04/09/2013, 3:35 PM

## 2013-04-09 NOTE — Progress Notes (Signed)
53 y.o. right-handed male with history of hypertension, tobacco and alcohol abuse. Admitted 02/23/2013 when patient developed left-sided numbness and weakness while at work. Patient reports falling at that time due to weakness on the left. Subjective/Complaints: Left Ear pain improving, reduced hearing , hx of reduced hearing but no pain in past  no ear surgeries Review of Systems -neg except as above Objective: Vital Signs: Blood pressure 119/74, pulse 79, temperature 98 F (36.7 C), temperature source Oral, resp. rate 18, height 6' (1.829 m), weight 62.234 kg (137 lb 3.2 oz), SpO2 99.00%. No results found. No results found for this or any previous visit (from the past 72 hour(s)).    Constitutional: He appears alert, no distress.  HENT: poor dentition,  cerumenosis in Left ext improving, ext ear appears normal otherwise, unable to visualize TM yet Head: Normocephalic. atraumatic Eyes:  Pupils EOMI, without nystagmus  Neck: Normal range of motion. Neck supple. No thyromegaly present.  Cardiovascular: Normal rate and regular rhythm. No murmurs  Respiratory: Effort normal and breath sounds normal. No respiratory distress. No wheezes, rales, rhonchi  GI: Soft. Bowel sounds are normal. He exhibits no distension.  Neurological:  . Oriented to name and hosp. He did follow one and two step commands.[name]. 3-5 left shoulder ,grip was 3-/5. LLE was 4-/5 with HF and KE and trace at the ankle.  Speech moderately dysarthric.  RUE 4/5 RLE 5/5 Skin: Skin is warm and dry.  Psychiatric:  Flat,     Assessment/Plan: 1. 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 Physiatrist is providing close team supervision and 24 hour management of active medical problems listed below. Physiatrist and rehab team continue to assess barriers to  discharge/monitor patient progress toward functional and medical goals . FIM: FIM - Bathing Bathing Steps Patient Completed: Chest;Left Arm;Abdomen;Front perineal area;Right upper leg;Left upper leg;Right Arm;Right lower leg (including foot);Left lower leg (including foot);Buttocks Bathing: 5: Supervision: Safety issues/verbal cues  FIM - Upper Body Dressing/Undressing Upper body dressing/undressing steps patient completed: Put head through opening of pull over shirt/dress;Thread/unthread right sleeve of pullover shirt/dresss;Pull shirt over trunk;Thread/unthread left sleeve of pullover shirt/dress Upper body dressing/undressing: 5: Set-up assist to: Obtain clothing/put away FIM - Lower Body Dressing/Undressing Lower body dressing/undressing steps patient completed: Thread/unthread right pants leg;Thread/unthread left pants leg;Pull pants up/down;Thread/unthread right underwear leg;Thread/unthread left underwear leg;Pull underwear up/down;Don/Doff right sock;Don/Doff left sock;Don/Doff right shoe;Fasten/unfasten right shoe Lower body dressing/undressing: 4: Min-Patient completed 75 plus % of tasks  FIM - Toileting Toileting steps completed by patient: Adjust clothing prior to toileting;Performs perineal hygiene;Adjust clothing after toileting Toileting Assistive Devices: Grab bar or rail for support Toileting: 5: Supervision: Safety issues/verbal cues  FIM - Diplomatic Services operational officerToilet Transfers Toilet Transfers Assistive Devices: Art gallery managerWalker Toilet Transfers: 5-To toilet/BSC: Supervision (verbal cues/safety issues);5-From toilet/BSC: Supervision (verbal cues/safety issues)  FIM - BankerBed/Chair Transfer Bed/Chair Transfer Assistive Devices: Walker;Arm rests;Orthosis Bed/Chair Transfer: 5: Bed > Chair or W/C: Supervision (verbal cues/safety issues);5: Chair or W/C > Bed: Supervision (verbal cues/safety issues)  FIM - Locomotion: Wheelchair Distance: 150 Locomotion: Wheelchair: 0: Activity did not occur FIM -  Locomotion: Ambulation Locomotion: Ambulation Assistive Devices: Orthosis;Walker - Rolling Ambulation/Gait Assistance: 5: Supervision Locomotion: Ambulation: 5: Travels 150 ft or more with supervision/safety issues  Comprehension Comprehension Mode: Auditory Comprehension: 5-Follows basic conversation/direction: With extra time/assistive device  Expression Expression Mode: Verbal Expression: 5-Expresses complex 90% of the time/cues < 10% of  the time  Social Interaction Social Interaction: 6-Interacts appropriately with others with medication or extra time (anti-anxiety, antidepressant).  Problem Solving Problem Solving: 5-Solves basic problems: With no assist  Memory Memory: 5-Recognizes or recalls 90% of the time/requires cueing < 10% of the time  Medical Problem List and Plan:  1. 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 DVT Prophylaxis/Anticoagulation: SCDs. Monitor for any signs of DVT  3. Pain Management: Tylenol as needed. 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 Neuropsych: This patient is not capable of making decisions on his own behalf.  5. Seizure prophylaxis. Keppra 500 mg twice a day. Monitor for any seizure activity  6. Hypertension. Norvasc 10 mg daily,D/C lisinopril 2.5, propranolol  20mg     TID                          7. Dysphagia.[name] dysphagia 3 thin liquids. Followup speech therapy ,appetitie improved off  megace ,cont Remeron 8. History of tobacco alcohol abuse. Urine drug screen positive cocaine and marijuana. Will reduce dose of nic patch 9.  Rectal pain,resolved 10.  Mood- improved on remeron 11.  Ear pain Left , wax visible in ext canal , debrox 5gtt Left ear BID for additional 3 days     LOS (Days) 38 A FACE TO FACE EVALUATION WAS PERFORMED   Atheena Spano E 04/09/2013, 7:39 AM

## 2013-04-10 MED ORDER — ALPRAZOLAM 0.25 MG PO TABS
0.2500 mg | ORAL_TABLET | Freq: Three times a day (TID) | ORAL | Status: DC | PRN
  Administered 2013-04-11: 0.25 mg via ORAL
  Filled 2013-04-10: qty 1

## 2013-04-10 MED ORDER — QUETIAPINE FUMARATE 25 MG PO TABS
25.0000 mg | ORAL_TABLET | Freq: Every day | ORAL | Status: DC
  Administered 2013-04-10 – 2013-04-13 (×4): 25 mg via ORAL
  Filled 2013-04-10 (×5): qty 1

## 2013-04-10 NOTE — Progress Notes (Addendum)
53 y.o. right-handed male with history of hypertension, tobacco and alcohol abuse. Admitted 02/23/2013 when patient developed left-sided numbness and weakness while at work. Patient reports falling at that time due to weakness on the left. Subjective/Complaints: Left Ear pain improving, reduced hearing , somnolence in am Review of Systems -neg except as above Objective: Vital Signs: Blood pressure 111/77, pulse 73, temperature 97.5 F (36.4 C), temperature source Oral, resp. rate 18, height 6' (1.829 m), weight 62.234 kg (137 lb 3.2 oz), SpO2 100.00%. No results found. No results found for this or any previous visit (from the past 72 hour(s)).    Constitutional: He appears alert, no distress.  HENT: poor dentition,  cerumenosis in Left ext improving, ext ear appears normal otherwise, unable to visualize TM yet Head: Normocephalic. atraumatic Eyes:  Pupils EOMI, without nystagmus  Neck: Normal range of motion. Neck supple. No thyromegaly present.  Cardiovascular: Normal rate and regular rhythm. No murmurs  Respiratory: Effort normal and breath sounds normal. No respiratory distress. No wheezes, rales, rhonchi  GI: Soft. Bowel sounds are normal. He exhibits no distension.  Neurological:  . Oriented to name and hosp. He did follow one and two step commands.[name] 3-5 left shoulder ,grip was 3-/5. LLE was 4-/5 with HF and KE and trace at the ankle.  Speech moderately dysarthric.  RUE 4/5 RLE 5/5 Skin: Skin is warm and dry.  Psychiatric:  Flat,     Assessment/Plan: 1. 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 Physiatrist is providing close team supervision and 24 hour management of active medical problems listed below. Physiatrist and rehab team continue to assess barriers to discharge/monitor patient progress toward functional and  medical goals . FIM: FIM - Bathing Bathing Steps Patient Completed: Chest;Left Arm;Abdomen;Front perineal area;Right upper leg;Left upper leg;Right Arm;Right lower leg (including foot);Left lower leg (including foot);Buttocks Bathing: 5: Supervision: Safety issues/verbal cues  FIM - Upper Body Dressing/Undressing Upper body dressing/undressing steps patient completed: Put head through opening of pull over shirt/dress;Thread/unthread right sleeve of pullover shirt/dresss;Pull shirt over trunk;Thread/unthread left sleeve of pullover shirt/dress Upper body dressing/undressing: 5: Set-up assist to: Obtain clothing/put away FIM - Lower Body Dressing/Undressing Lower body dressing/undressing steps patient completed: Thread/unthread right pants leg;Thread/unthread left pants leg;Pull pants up/down;Thread/unthread right underwear leg;Thread/unthread left underwear leg;Pull underwear up/down;Don/Doff right sock;Don/Doff left sock;Don/Doff right shoe;Fasten/unfasten right shoe Lower body dressing/undressing: 4: Min-Patient completed 75 plus % of tasks  FIM - Toileting Toileting steps completed by patient: Adjust clothing prior to toileting;Adjust clothing after toileting;Performs perineal hygiene Toileting Assistive Devices: Grab bar or rail for support Toileting: 5: Supervision: Safety issues/verbal cues  FIM - Diplomatic Services operational officer Devices: Art gallery manager Transfers: 4-To toilet/BSC: Min A (steadying Pt. > 75%);4-From toilet/BSC: Min A (steadying Pt. > 75%)  FIM - Banker Devices: Walker;Arm rests Bed/Chair Transfer: 4: Bed > Chair or W/C: Min A (steadying Pt. > 75%);4: Chair or W/C > Bed: Min A (steadying Pt. > 75%)  FIM - Locomotion: Wheelchair Distance: 150 Locomotion: Wheelchair: 0: Activity did not occur FIM - Locomotion: Ambulation Locomotion: Ambulation Assistive Devices: Orthosis;Walker - Rolling Ambulation/Gait Assistance:  5: Supervision Locomotion: Ambulation: 5: Travels 150 ft or more with supervision/safety issues  Comprehension Comprehension Mode: Auditory Comprehension: 5-Follows basic conversation/direction: With extra time/assistive device  Expression Expression Mode: Verbal Expression: 5-Expresses complex 90% of the time/cues < 10% of the time  Social Interaction Social Interaction: 6-Interacts appropriately with others with medication or extra time (anti-anxiety, antidepressant).  Problem Solving Problem Solving: 5-Solves basic problems: With no assist  Memory Memory: 5-Recognizes or recalls 90% of the time/requires cueing < 10% of the time  Medical Problem List and Plan:  1. 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 DVT Prophylaxis/Anticoagulation: SCDs. Monitor for any signs of DVT  3. Pain Management: Tylenol as needed. 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 Neuropsych: This patient is not capable of making decisions on his own behalf.  5. Seizure prophylaxis. Keppra 500 mg twice a day. Monitor for any seizure activity  6. Hypertension. Norvasc 10 mg daily,D/C lisinopril 2.5, propranolol  20mg     TID                          7. Dysphagia.[name]. dysphagia 3 thin liquids. Followup speech therapy ,appetitie improved off  megace ,cont Remeron 8. History of tobacco alcohol abuse. Urine drug screen positive cocaine and marijuana. Will reduce dose of nic patch 14mcg 9.  Rectal pain,resolved 10.  Mood- improved on remeron 11.  Ear pain Left , wax visible in ext canal , debrox 5gtt Left ear BID for additional 2 days, cerumen clearing , may need to follow with corticosporin gtt     LOS (Days) 39 A FACE TO FACE EVALUATION WAS PERFORMED   Johannah Rozas E 04/10/2013, 9:28 AM

## 2013-04-11 ENCOUNTER — Encounter (HOSPITAL_COMMUNITY): Payer: Self-pay

## 2013-04-11 ENCOUNTER — Inpatient Hospital Stay (HOSPITAL_COMMUNITY): Payer: Medicaid Other | Admitting: Speech Pathology

## 2013-04-11 ENCOUNTER — Inpatient Hospital Stay (HOSPITAL_COMMUNITY): Payer: Medicaid Other | Admitting: Occupational Therapy

## 2013-04-11 ENCOUNTER — Inpatient Hospital Stay (HOSPITAL_COMMUNITY): Payer: Medicaid Other | Admitting: Physical Therapy

## 2013-04-11 MED ORDER — CARBAMIDE PEROXIDE 6.5 % OT SOLN
10.0000 [drp] | Freq: Two times a day (BID) | OTIC | Status: AC
  Administered 2013-04-11 – 2013-04-13 (×6): 10 [drp] via OTIC
  Filled 2013-04-11: qty 15

## 2013-04-11 NOTE — Progress Notes (Signed)
Patient has continued to complain of left ear pain on and off throughout shift.  Awakened at intervals to request pain medication.  Medicated with Xanax at 0223 as patient requested for anxiety.  Patient requesting staff to just "stick something in there (ear) and pull the wax out".  Encouraged to not stick anything inside ear canal as doing so may lodge wax back deeper into canal or damage ear.  Verbalized understanding.    Kelli HopeBarber, Mizuki Hoel M

## 2013-04-11 NOTE — Progress Notes (Signed)
NUTRITION FOLLOW UP  Intervention:   1.  General healthful diet; encourage intake of foods and beverages as able.  RD to follow and assess for nutritional adequacy.  2.  Resource Breeze po TID, each supplement provides 250 kcal and 9 grams of protein  NUTRITION DIAGNOSIS:  Inadequate oral intake related to poor appetite as evidenced by PO intake variable at meals- progressing  Monitor:  1. Food/Beverage; pt meeting >/=90% estimated needs with tolerance - met  2. Wt/wt change; monitor trends - pt's weight down 1 pound since admission   Assessment:   Pt admitted with deconditioning r/t right temporoparietal ICH.  Pt's intake remains 75-100% of his meals.  Pt loves Peach and Arabella Merles and reports drinking 2-3 per day.   Pt continues to order the same moderate sized meals.  He has very little variation in his diet.  He is ok with this, however it is difficult to determine whether intake (although 100% of meals) is adequate to sustain weight maintenance and promote lean body mass growth.  His wt today is improved to 137 lbs (possibly with clothes on).  Pt is difficult to motivate when it comes to eating due to his preferences.  He does have snacks in room and continues to drink [place] as well.  He occasionally has visitors who will bring him meals.     Height: Ht Readings from Last 1 Encounters:  03/02/13 6' (1.829 m)    Weight Status:   Wt Readings from Last 1 Encounters:  04/06/13 137 lb 3.2 oz (62.234 kg)  Admit wt:        132 lb 7.9 oz (60.1 kg)  Re-estimated needs:  Kcal: 2100-2280  Protein: 90-110g  Fluid: >2.1 L/day  Skin: intact  Diet Order: General   Intake/Output Summary (Last 24 hours) at 04/11/13 1300 Last data filed at 04/11/13 0745  Gross per 24 hour  Intake    840 ml  Output      0 ml  Net    840 ml    Last BM: 2/15  Labs:  No results found for this basename: NA, K, CL, CO2, BUN, CREATININE, CALCIUM, MG, PHOS, GLUCOSE,  in the last 168 hours  CBG  (last 3)  No results found for this basename: GLUCAP,  in the last 72 hours  Scheduled Meds: . amLODipine  10 mg Oral Daily  . antiseptic oral rinse  15 mL Mouth Rinse BID  . carbamide peroxide  10 drop Left Ear BID  . feeding supplement (RESOURCE BREEZE)  1 Container Oral TID BM  . folic acid  1 mg Oral Daily  . gabapentin  100 mg Oral TID  . levETIRAcetam  500 mg Oral BID  . mirtazapine  15 mg Oral QHS  . multivitamin with minerals  1 tablet Oral Daily  . nicotine  14 mg Transdermal Daily  . pantoprazole  40 mg Oral Daily  . potassium chloride  20 mEq Oral Daily  . propranolol  20 mg Oral TID  . QUEtiapine  25 mg Oral QHS  . senna-docusate  2 tablet Oral BID  . sorbitol, milk of mag, mineral oil, glycerin (SMOG) enema  960 mL Rectal Once  . thiamine  100 mg Oral Daily    Brynda Greathouse, MS RD LDN Clinical Inpatient Dietitian Pager: [phone] Weekend/After hours pager: [phone]

## 2013-04-11 NOTE — Progress Notes (Signed)
Occupational Therapy Session Note  Patient Details  Name: Eric May MRN: [id number] Date of Birth: [birth year]  Today's Date: 04/11/2013 Time: [id number] Time Calculation (min): 54 min  Short Term Goals: No short term goals set  Skilled Therapeutic Interventions/Progress Updates:    Pt seen for ADL retraining with focus on functional mobility with RW, sit <> stand, and functional use of LUE during self-care tasks of bathing and dressing. Gathered clothing with RW with close supervision with ambulation and when reaching outside BOS to obtain clothes from dresser drawer. Pt completed toilet transfer and toileting with distant supervision. Bathing completed at sit > stand level in room shower with use of LUE approx 50% of time. Completed dressing with distant supervision for balance with pulling up pants in standing and spontaneous use of LUE when pulling pants over hips. Pt able to don both socks and tie both shoes this session with increased time. Pt continues to require assist with donning Lt shoe due to AFO.  Therapy Documentation Precautions:  Precautions Precautions: Fall Precaution Comments: pt con't to be agitated at onset of pain Restrictions Weight Bearing Restrictions: No General:   Vital Signs: Therapy Vitals Pulse Rate: 73 BP: 126/85 mmHg Pain:  Pt with c/o pain in Lt ear, premedicated  See FIM for current functional status  Therapy/Group: Individual Therapy  [name], Okie Bogacz 04/11/2013, 10:22 AM

## 2013-04-11 NOTE — Progress Notes (Signed)
Speech Language Pathology Daily Session Note  Patient Details  Name: Eric May MRN: [id number] Date of Birth: 1960/12/11  Today's Date: 04/11/2013 Time: [id number] Time Calculation (min): 55 min  Short Term Goals: Week 6: SLP Short Term Goal 1 (Week 6): Patient will alternate attention to task for 10 minutes with Min clinician cues. SLP Short Term Goal 2 (Week 6): Patient will solve complex problems with Min verbal cues. SLP Short Term Goal 3 (Week 6): Patient will anticipate needs for discharge with Supervision level question cues SLP Short Term Goal 4 (Week 6): Patient will self-monitor and correct verbal perseveration with Supervision level verbal cues.   Skilled Therapeutic Interventions: Skilled treatment session focused on addressing cognitive goals. SLP facilitated session with medication management task and Supervision level verbal uces to organize task; patient was then Mod I with no errors while actually loading the medicaiton box accurately.  Patient also required Sueprvision level verbal/explainations for verbal problem solving with medication management.  Continue with current plan of care.         FIM:  Comprehension Comprehension Mode: Auditory Comprehension: 5-Follows basic conversation/direction: With no assist Expression Expression Mode: Verbal Expression: 5-Expresses complex 90% of the time/cues < 10% of the time Social Interaction Social Interaction: 6-Interacts appropriately with others with medication or extra time (anti-anxiety, antidepressant). Problem Solving Problem Solving: 5-Solves complex 90% of the time/cues < 10% of the time Memory Memory: 5-Recognizes or recalls 90% of the time/requires cueing < 10% of the time  Pain Pain Assessment Pain Assessment: 0-10 Pain Score: 3  Pain Type: Acute pain Pain Location: Shoulder Pain Orientation: Left Pain Descriptors / Indicators: Aching Pain Onset: Gradual Pain Intervention(s): RN made aware Multiple  Pain Sites: No  Therapy/Group: Individual Therapy  Eric May, M.A., Eric May [phone]  Eric May 04/11/2013, 3:23 PM

## 2013-04-11 NOTE — Progress Notes (Signed)
Physical Therapy Session Note  Patient Details  Name: Eric May MRN: [id number] Date of Birth: [birth year]  Today's Date: 04/11/2013 Time: 1006-1100 Time Calculation (min): 54 min  Short Term Goals: No short term goals set  Skilled Therapeutic Interventions/Progress Updates:   Pt resting in recliner; still reporting ear pain/irritation but premedicated and agreeable to therapy.  Performed gait to gym x 150' with RW and supervision with carryover of full step and stride length bilaterally but still at decreased velocity and gaze down at floor.  In gym performed bilat UE and LE strengthening, endurance, reciprocal coordination on Nustep at level 6 resistance x 10 minutes without strap to hold L foot and hand; pt able to maintain grip without external aid.  Following Nustep pt performed higher level gait training with obstacle course (stepping over various height obstacles, lateral stepping between obstacles, weaving L and R around cones, up/down curb) x 40' x 4 reps beginning with RW >> RW with cognitive dual naming task >> no AD >> no AD and reaching to the floor to pick up items at various heights.  PT required min A during obstacle negotiation for safety but no verbal cues needed for safety or sequencing.  Returned to room with RW with focus on maintaining upright gaze and increasing/maintaining increased gait velocity with supervision-min guard.    Therapy Documentation Precautions:  Precautions Precautions: Fall Precaution Comments: pt con't to be agitated at onset of pain Restrictions Weight Bearing Restrictions: No Vital Signs: Therapy Vitals Pulse Rate: 73 BP: 126/85 mmHg Pain: Pain Assessment Pain Assessment: No/denies pain Locomotion : Ambulation Ambulation/Gait Assistance: 5: Supervision   See FIM for current functional status  Therapy/Group: Individual Therapy  Edman CircleHall, Jilliana Burkes [facility] 04/11/2013, 11:13 AM

## 2013-04-11 NOTE — Progress Notes (Signed)
Social Work Patient ID: Eric May, male   DOB: November 17, 1960, 53 y.o.   MRN: [id number] Spoke with Eric May-girlfriend to inform team and MD feel pt is ready for discharge this week.  Have targeted Thursday, Eric May hopeful weather will be ok. Discussed having her come in to go through more therapy if feels needed.  She is a CNA and feels she is able to provide the care he needs.  Discussed Equipment and follow up.  [facility] [place] [facility] [place] [facility]23.  Will work on discharge plans and getting pt home  [facility] providing 24 hour care of pt.  Pt is agreeable to this.

## 2013-04-11 NOTE — Progress Notes (Signed)
Social Work Patient ID: Eric May, male   DOB: January 17, 1961, 53 y.o.   MRN: [id number] Pt requires a lightweight wheelchair to be able to self propel and uses for self care tasks.  He is unable to self propel in a standard wheelchair. Working on equipment and follow up therapies for home.  Contacted Press photographer[employer]ia-[employer] worker and she has requested more medical information on pt So can determine his disability.  Pt agreeable to discharge Thursday.

## 2013-04-11 NOTE — Progress Notes (Signed)
53 y.o. right-handed male with history of hypertension, tobacco and alcohol abuse. Admitted 02/23/2013 when patient developed left-sided numbness and weakness while at work. Patient reports falling at that time due to weakness on the left. Subjective/Complaints: Left Ear pain Review of Systems -neg except as above Objective: Vital Signs: Blood pressure 126/85, pulse 73, temperature 98.5 F (36.9 C), temperature source Oral, resp. rate 18, height 6' (1.829 m), weight 62.234 kg (137 lb 3.2 oz), SpO2 99.00%. No results found. No results found for this or any previous visit (from the past 72 hour(s)).    Constitutional: He appears alert, no distress.  HENT: poor dentition,  cerumenosis occlusive unable to visualize TM yet Head: Normocephalic. atraumatic Eyes:  Pupils EOMI, without nystagmus  Neck: Normal range of motion. Neck supple. No thyromegaly present.  Cardiovascular: Normal rate and regular rhythm. No murmurs  Respiratory: Effort normal and breath sounds normal. No respiratory distress. No wheezes, rales, rhonchi  GI: Soft. Bowel sounds are normal. He exhibits no distension.  Neurological:  . Oriented to name and hosp. He did follow one and two step commands.[name] 3-5 left shoulder ,grip was 3-/5. LLE was 4-/5 with HF and KE and trace at the ankle.  Speech moderately dysarthric.  RUE 4/5 RLE 5/5 Skin: Skin is warm and dry.  Psychiatric:  Flat,     Assessment/Plan: 1. 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 Physiatrist is providing close team supervision and 24 hour management of active medical problems listed below. Physiatrist and rehab team continue to assess barriers to discharge/monitor patient progress toward functional and medical goals . FIM: FIM - Bathing Bathing Steps Patient Completed: Chest;Abdomen;Left  Arm;Front perineal area;Right Arm Bathing: 5: Supervision: Safety issues/verbal cues  FIM - Upper Body Dressing/Undressing Upper body dressing/undressing steps patient completed: Thread/unthread right sleeve of pullover shirt/dresss;Thread/unthread left sleeve of pullover shirt/dress;Put head through opening of pull over shirt/dress Upper body dressing/undressing: 4: Min-Patient completed 75 plus % of tasks FIM - Lower Body Dressing/Undressing Lower body dressing/undressing steps patient completed: Thread/unthread right pants leg;Thread/unthread left pants leg;Pull pants up/down;Thread/unthread right underwear leg;Thread/unthread left underwear leg;Pull underwear up/down;Don/Doff right sock;Don/Doff left sock;Don/Doff right shoe;Fasten/unfasten right shoe Lower body dressing/undressing: 4: Min-Patient completed 75 plus % of tasks  FIM - Toileting Toileting steps completed by patient: Adjust clothing prior to toileting;Performs perineal hygiene;Adjust clothing after toileting Toileting Assistive Devices: Grab bar or rail for support Toileting: 5: Supervision: Safety issues/verbal cues  FIM - Diplomatic Services operational officer Devices: Best boy Transfers: 4-To toilet/BSC: Min A (steadying Pt. > 75%);4-From toilet/BSC: Min A (steadying Pt. > 75%)  FIM - Banker Devices: Walker;Arm rests Bed/Chair Transfer: 4: Sit > Supine: Min A (steadying pt. > 75%/lift 1 leg);4: Bed > Chair or W/C: Min A (steadying Pt. > 75%)  FIM - Locomotion: Wheelchair Distance: 150 Locomotion: Wheelchair: 0: Activity did not occur FIM - Locomotion: Ambulation Locomotion: Ambulation Assistive Devices: Orthosis;Walker - Rolling Ambulation/Gait Assistance: 5: Supervision Locomotion: Ambulation: 5: Travels 150 ft or more with supervision/safety issues  Comprehension Comprehension Mode: Auditory Comprehension: 5-Follows basic conversation/direction: With  extra time/assistive device  Expression Expression Mode: Verbal Expression: 5-Expresses complex 90% of the time/cues < 10% of the time  Social Interaction Social Interaction: 6-Interacts appropriately with others with medication or extra time (anti-anxiety, antidepressant).  Problem Solving Problem Solving: 5-Solves basic problems: With no assist  Memory Memory: 5-Recognizes or recalls 90% of the time/requires cueing < 10% of the time  Medical Problem List and Plan:  1. 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 DVT Prophylaxis/Anticoagulation: SCDs. Monitor for any signs of DVT  3. Pain Management: Tylenol as needed. 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 Neuropsych: This patient is not capable of making decisions on his own behalf.  5. Seizure prophylaxis. Keppra 500 mg twice a day. Monitor for any seizure activity  6. Hypertension. Norvasc 10 mg daily,D/C lisinopril 2.5, propranolol  20mg     TID                          7. Dysphagia.[name]. dysphagia 3 thin liquids. Followup speech therapy ,appetitie improved off  megace ,cont Remeron 8. History of tobacco alcohol abuse. Urine drug screen positive cocaine and marijuana. Will reduce dose of nic patch 14mcg 9.  Rectal pain,resolved 10.  Mood- improved on remeron 11.  Ear pain Left , wax visible in ext canal , Increase debrox 10gtt Left ear BID for additional 3 days, cerumen clearing , may need to follow with corticosporin gtt     LOS (Days) 40 A FACE TO FACE EVALUATION WAS PERFORMED   Eric May E 04/11/2013, 8:18 AM

## 2013-04-12 ENCOUNTER — Inpatient Hospital Stay (HOSPITAL_COMMUNITY): Payer: Self-pay | Admitting: *Deleted

## 2013-04-12 ENCOUNTER — Inpatient Hospital Stay (HOSPITAL_COMMUNITY): Payer: Medicaid Other | Admitting: Physical Therapy

## 2013-04-12 ENCOUNTER — Inpatient Hospital Stay (HOSPITAL_COMMUNITY): Payer: Medicaid Other | Admitting: Speech Pathology

## 2013-04-12 ENCOUNTER — Inpatient Hospital Stay (HOSPITAL_COMMUNITY): Payer: Medicaid Other | Admitting: Occupational Therapy

## 2013-04-12 NOTE — Progress Notes (Signed)
Occupational Therapy Session Note  Patient Details  Name: Eric May MRN: [id number] Date of Birth: 14-Feb-1961  Today's Date: 04/12/2013 Time: 0910-0930 Time Calculation (min): 20 min  Short Term Goals: No short term goals set  Skilled Therapeutic Interventions/Progress Updates:    Pt missed 40 mins secondary to RN care and administering ear drops that require 30 mins in sidelying to allow to work.  Upon return pt reports need to toilet, ambulated to bathroom with supervision with RW and Lt hand orthosis on RW.  Pt completed grooming and dressing at sit > stand level at sink with setup assist.  Pt with increased spontaneous use of LUE during dressing task.    Therapy Documentation Precautions:  Precautions Precautions: Fall Precaution Comments: pt con't to be agitated at onset of pain Restrictions Weight Bearing Restrictions: No General: General Amount of Missed OT Time (min): 40 Minutes Pain:  pt with no c/o pain  See FIM for current functional status  Therapy/Group: Individual Therapy  [name], Rielly Corlett 04/12/2013, 10:24 AM

## 2013-04-12 NOTE — Progress Notes (Signed)
Recreational Therapy Session Note  Patient Details  Name: Eric May MRN: [id number] Date of Birth: 1960-05-03 Today's Date: 04/12/2013  Pain: c/o earache, unrated, RN aware, premedicated  Skilled Therapeutic Interventions/Progress Updates: Pt in bed upon arrival with c/o ear ache and declined participation in therapy.  Pt did request to ambulate to bathroom with RW for toileting with supervision.  NT in room to assist with hygiene and assess vitals. Maitlyn Penza 04/12/2013, 4:13 PM

## 2013-04-12 NOTE — Consult Note (Signed)
NEUROCOGNITIVE TESTING - [facility]IDENTIAL [facility]   MEDICAL NECESSITY:  Eric May was [facility] for neurocognitive testing owing to the patient's diagnosis of CVA.   According to medical records, Eric May was admitted to the rehab unit owing to "functional deficits secondary to right temporo-parietal ICH." He has a history of hypertension, tobacco and alcohol abuse. He was admitted on 02/23/2013 after developing left-sided numbness and weakness while at work. Patient reports falling at that time due to weakness on the left. Blood pressure was 194/117. CT of the head showed acute parenchymal hemorrhage over the right temporal parietal region measuring 4.9 x 3.2 cm with local mass effect and midline shift of 4 mm. Echocardiogram with ejection fraction 65% no valvular abnormalities. Carotid Dopplers showed no ICA stenosis. MRI of the brain performed on 02/24/2013 again showed intraparenchymal hematoma 5.2 x 3.7 x 3.3 cm with surrounding vasogenic edema. Urine drug screen was positive for cocaine and marijuana.   During today's visit, Eric May denied suffering from any cognitive deficits post-stroke with the exception of a tendency to "get caught up with [his] words" (i.e., rambling). He described his current mood as good and said he has no history of mental health treatment. He feels that he is making strides in therapy. Of note, the patient did not appear to be the most accurate historian. And multiple staff members reported to this provider that Eric May was initially quite agitated and confused but these symptoms have abated greatly. They also noted significant cognitive deficit.   The patient was referred for neuropsychological consultation given the possibility of cognitive sequelae subsequent to the current medical status and in order to assist in treatment planning.   PROCEDURES: [2 units of [id number] on 04/11/13]   Diagnostic Interview Medical record review Behavioral observations  Neuropsychological testing  Repeatable Battery for the Assessment of Neuropsychological Status (form A)  Of note, two tasks were not administered due to certain physical or cognitive limitations.   TEST RESULTS:   RBANS Subtests Raw Score Percentile Description  List Learning 12 <1 Markedly impaired  Story Memory 4 <1 Markedly impaired  Figure Copy 9 <1 Markedly impaired  Line Orientation D/C <1 Markedly impaired  Picture Naming 7 <1 Markedly impaired  Semantic Fluency 11 2 Markedly impaired  Digit Span 7 7 Borderline impaired  Coding N/A N/A N/A  List Recall 0 <1 Markedly impaired  List Recognition 18 7 Borderline impaired  Story Recall 1 <1 Markedly impaired  Figure recall N/A N/A N/A   Cognitive Evaluation: Test results revealed markedly impaired functioning in most cognitive domains and thinking skills assessed. There were also indications of possible left side neglect and poor insight.   Emotional & Behavioral Evaluation: Eric May was appropriately dressed for season and situation. Normal posture was noted. He was generally friendly and rapport was adequately established. His speech was as expected and he was able to express ideas effectively though he was somewhat tangential. He seemed to understand test directions readily. His affect was generalized. Attention and motivation were adequate. Optimal test taking conditions were maintained.  From an emotional standpoint, Eric May denied experiencing any major signs of clinical psychopathology though he has an observed history of agitation and confusion initially post-stroke. These symptoms have purportedly abated. He denied suffering from any issues adjusting to his hospital stay. Suicidal/homicidal ideation, plan or intent was denied. No manic or hypomanic episodes were reported. The patient denied ever experiencing any auditory/visual hallucinations. No  major personality changes were endorsed.    Overall, Eric May appears to be experiencing a great deal of cognitive deficit post-stroke with global impairments noted. He was suffering from behavioral disturbance that is seemingly abating to a degree.  He exhibits very poor insight into his deficits which is not uncommon given the nature and placement of his stroke. He also appears to be experiencing left side neglect to some degree. In addition, his history of polysubstance abuse could also be playing a role in his present condition but the extent of his use is not known.   Overall, it is my hope that with time Eric May cognition and behavioral symptoms will improve. Repeat testing as an outpatient is warranted to evaluate for interval change.   In the meantime, in light of these findings, the following recommendations are provided.    RECOMMENDATIONS  Recommendations for treatment team:    When interacting with Eric May, directions and information should be provided in a simple, straight forward manner, and the treatment team should avoid giving multiple instructions simultaneously.    He may also benefit from being provided with multiple trials to learn new skills given the noted memory inefficiencies. In addition, he will greatly benefit from recognition cueing and an increase in structure and context (i.e., directions given in conversational format as opposed to a random list of instructions).     To the extent possible, multitasking should be avoided.   He requires more time than typical to process information. The treatment team may benefit from waiting for a verbal response to information before presenting additional information.    Be aware that he is suffering from significant visual spatial deficits of which he is not aware. This makes him at risk for falls. Taking this into consideration will help tailor therapy and keep accidents at bay as much as possible when he is trying to  navigate his surroundings.    He demonstrated a tendency to be impulsive. Members of the treatment team should be aware that they may need to stop the patient from starting something in order to ensure that he has a clear understanding of what he is supposed to do.    Performance will generally be best in a structured, routine, and familiar environment, as opposed to situations involving complex problems.    He does NOT appear to be able to make the most appropriate/competent decisions regarding his care.    Recommendations for discharge planning:    Complete a comprehensive neuropsychological evaluation as an outpatient in 8-12 months to assess for interval change. This can be done through Orie Fisherman, PsyD by calling the following number: [phone].    Establish long-term follow-up care with a provider knowledgeable in stroke.    Maintain engagement in mentally, physically and cognitively stimulating activities.    Strive to maintain a healthy lifestyle (e.g., proper diet and exercise) in order to promote physical, cognitive and emotional health.    Due to the nature and severity of the symptoms noted during this evaluation, it is recommended that he initially obtain constant care and supervision following this hospitalization.    Eric May cognitive symptoms place him at a significant vocational disadvantage.    The patient should refrain from driving at this time.       Debbe Mounts, Psy.D.  Clinical Neuropsychologist

## 2013-04-12 NOTE — Progress Notes (Signed)
53 y.o. right-handed male with history of hypertension, tobacco and alcohol abuse. Admitted 02/23/2013 when patient developed left-sided numbness and weakness while at work. Patient reports falling at that time due to weakness on the left. Subjective/Complaints: Left Ear pain improving Tying shoes with both hands Review of Systems -neg except as above Objective: Vital Signs: Blood pressure 112/73, pulse 73, temperature 98.2 F (36.8 C), temperature source Oral, resp. rate 18, height 6' (1.829 m), weight 62.234 kg (137 lb 3.2 oz), SpO2 100.00%. No results found. No results found for this or any previous visit (from the past 72 hour(s)).    Constitutional: He appears alert, no distress.  HENT: poor dentition,  cerumenosis occlusive unable to visualize TM yet Head: Normocephalic. atraumatic Eyes:  Pupils EOMI, without nystagmus  Neck: Normal range of motion. Neck supple. No thyromegaly present.  Cardiovascular: Normal rate and regular rhythm. No murmurs  Respiratory: Effort normal and breath sounds normal. No respiratory distress. No wheezes, rales, rhonchi  GI: Soft. Bowel sounds are normal. He exhibits no distension.  Neurological:  . Oriented to name and hosp. He did follow one and two step commands.[name] 4-5 left shoulder ,grip was 3-/5. LLE was 4-/5 with HF and KE and trace at the ankle.  Speech moderately dysarthric.  RUE 5/5 RLE 5/5 Skin: Skin is warm and dry.  Psychiatric:  Flat,     Assessment/Plan: 1. 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 Physiatrist is providing close team supervision and 24 hour management of active medical problems listed below. Physiatrist and rehab team continue to assess barriers to discharge/monitor patient progress toward functional and medical goals . FIM: FIM - Bathing Bathing Steps  Patient Completed: Chest;Abdomen;Left Arm;Front perineal area;Right Arm;Buttocks;Right upper leg;Left upper leg;Right lower leg (including foot);Left lower leg (including foot) Bathing: 5: Supervision: Safety issues/verbal cues  FIM - Upper Body Dressing/Undressing Upper body dressing/undressing steps patient completed: Thread/unthread right sleeve of pullover shirt/dresss;Thread/unthread left sleeve of pullover shirt/dress;Put head through opening of pull over shirt/dress;Pull shirt over trunk Upper body dressing/undressing: 5: Set-up assist to: Obtain clothing/put away FIM - Lower Body Dressing/Undressing Lower body dressing/undressing steps patient completed: Thread/unthread right underwear leg;Thread/unthread left underwear leg;Pull underwear up/down;Thread/unthread right pants leg;Thread/unthread left pants leg;Pull pants up/down;Fasten/unfasten pants;Don/Doff right sock;Don/Doff left sock;Don/Doff right shoe;Fasten/unfasten right shoe;Fasten/unfasten left shoe Lower body dressing/undressing: 4: Min-Patient completed 75 plus % of tasks  FIM - Toileting Toileting steps completed by patient: Adjust clothing prior to toileting;Performs perineal hygiene;Adjust clothing after toileting Toileting Assistive Devices: Grab bar or rail for support Toileting: 5: Supervision: Safety issues/verbal cues  FIM - Diplomatic Services operational officer Devices: Best boy Transfers: 5-To toilet/BSC: Supervision (verbal cues/safety issues);5-From toilet/BSC: Supervision (verbal cues/safety issues)  FIM - Banker Devices: Walker;Arm rests Bed/Chair Transfer: 5: Bed > Chair or W/C: Supervision (verbal cues/safety issues);5: Chair or W/C > Bed: Supervision (verbal cues/safety issues)  FIM - Locomotion: Wheelchair Distance: 150 Locomotion: Wheelchair: 0: Activity did not occur FIM - Locomotion: Ambulation Locomotion: Ambulation Assistive Devices:  Orthosis;Walker - Rolling Ambulation/Gait Assistance: 5: Supervision Locomotion: Ambulation: 5: Travels 150 ft or more with supervision/safety issues  Comprehension Comprehension Mode: Auditory Comprehension: 5-Follows basic conversation/direction: With no assist  Expression Expression Mode: Verbal Expression: 5-Expresses complex 90% of the time/cues < 10% of the time  Social Interaction Social Interaction: 6-Interacts appropriately with others with medication or extra time (  anti-anxiety, antidepressant).  Problem Solving Problem Solving: 5-Solves complex 90% of the time/cues < 10% of the time  Memory Memory: 5-Recognizes or recalls 90% of the time/requires cueing < 10% of the time  Medical Problem List and Plan:  1. 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 DVT Prophylaxis/Anticoagulation: SCDs. Monitor for any signs of DVT  3. Pain Management: Tylenol as needed. 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 Neuropsych: This patient is not capable of making decisions on his own behalf.  5. Seizure prophylaxis. Keppra 500 mg twice a day. Monitor for any seizure activity  6. Hypertension. Norvasc 10 mg daily,D/C lisinopril 2.5, propranolol  20mg     TID                          7. Dysphagia.[name]. dysphagia 3 thin liquids. Followup speech therapy ,appetitie improved off  megace ,cont Remeron 8. History of tobacco alcohol abuse. Urine drug screen positive cocaine and marijuana. Will reduce dose of nic patch 14mcg 9.  Rectal pain,resolved 10.  Mood- improved on remeron 11.  Ear pain Left , wax visible in ext canal , Increase debrox 10gtt Left ear BID for additional 3 days, cerumen clearing , may need to follow with corticosporin gtt     LOS (Days) 41 A FACE TO FACE EVALUATION WAS PERFORMED   Eric May  E 04/12/2013, 9:56 AM

## 2013-04-12 NOTE — Progress Notes (Signed)
Speech Language Pathology Daily Session Note  Patient Details  Name: Eric May MRN: [id number] Date of Birth: Jun 18, 1960  Today's Date: 04/12/2013 Time: 0930-1025 Time Calculation (min): 55 min  Short Term Goals: Week 6: SLP Short Term Goal 1 (Week 6): Patient will alternate attention to task for 10 minutes with Min clinician cues. SLP Short Term Goal 2 (Week 6): Patient will solve complex problems with Min verbal cues. SLP Short Term Goal 3 (Week 6): Patient will anticipate needs for discharge with Supervision level question cues SLP Short Term Goal 4 (Week 6): Patient will self-monitor and correct verbal perseveration with Supervision level verbal cues.   Skilled Therapeutic Interventions: Skilled treatment session focused on addressing cognitive goals. Patient completing self-care at the sink and was able to anticipate and direct SLP how to assist with set-up with increased wait time.  SLP facilitated session with discussion regarding discharge and patient anticipated needs with Supervision level verbal cues.  Plan for family education tomorrow.           FIM:  Comprehension Comprehension Mode: Auditory Comprehension: 5-Understands complex 90% of the time/Cues < 10% of the time Expression Expression Mode: Verbal Expression: 5-Expresses complex 90% of the time/cues < 10% of the time Social Interaction Social Interaction: 6-Interacts appropriately with others with medication or extra time (anti-anxiety, antidepressant). Problem Solving Problem Solving: 5-Solves complex 90% of the time/cues < 10% of the time Memory Memory: 5-Recognizes or recalls 90% of the time/requires cueing < 10% of the time  Pain Pain Assessment Pain Assessment: No/denies pain Pain Score: 3   Therapy/Group: Individual Therapy  Charlane FerrettiMelissa Lakasha Mcfall, M.A., CCC-SLP [phone]  Shiane Wenberg 04/12/2013, 12:31 PM

## 2013-04-12 NOTE — Progress Notes (Signed)
Physical Therapy Session Note  Patient Details  Name: Eric May MRN: [id number] Date of Birth: [birth year]  Today's Date: 04/12/2013 Time: [id number] Time Calculation (min): 64 min  Short Term Goals: No short term goals set  Skilled Therapeutic Interventions/Progress Updates:   Pt resting in recliner.  Pt very pleased because he was able to completely dress himself this am, don AFO and tie bilat shoes without assistance.  Performed gait in controlled environment with RW without hand orthosis x 300' x 2 reps with supervision and intermittent verbal cues for full step length and heel strike with RLE for full stance time on LLE.  Pt able to maintain full grip on RW without hand orthosis.  Performed simulated car transfer training with pt use of RUE to open car door and performed full transfer in/out of car with supervision with verbal cues for sequence.    Discussed with pt stair situation at mother's house for when he goes to visit her.  He reports the stairs on the side entrance are next to a wall on L but no rail.  Performed stair negotiation up 4 stairs with R HHA for balance but no verbal cues for sequencing; when descending attempted standing in front of pt and having pt place UE on therapist's shoulders and descend with step to sequence.  Pt reports that if his girlfriend does not feel comfortable with stair negotiation then his step father can provide assistance.  Transitioned to ADL kitchen to problem solve how to transport dishes/food from cabinets/refridgerator to table for simple meal prep.  Pt reported that he does not feel comfortable using cane and carrying items in LUE.  Discussed use of re-usable grocery sack on RW to carry items in bag to table, perform meal prep at table and then return items to storage with bag.  Pt demonstrated ability to obtain bowl and spoon from cabinets, cereal from tall cabinet and milk from refrigerator and safely transport to/from table with bag.  Discussed  with pt also using bag to carry items to/from bathroom or bedroom safely. Pt verbalized understanding.  Returned to room with supervision.    Therapy Documentation Precautions:  Precautions Precautions: Fall Precaution Comments: pt con't to be agitated at onset of pain Restrictions Weight Bearing Restrictions: No Pain: Pain Assessment Pain Assessment: No/denies pain Pain Score: 3  Locomotion : Ambulation Ambulation/Gait Assistance: 5: Supervision   See FIM for current functional status  Therapy/Group: Individual Therapy  Edman CircleHall, Hanson Medeiros [facility] 04/12/2013, 12:09 PM

## 2013-04-13 ENCOUNTER — Inpatient Hospital Stay (HOSPITAL_COMMUNITY): Payer: Medicaid Other | Admitting: Physical Therapy

## 2013-04-13 ENCOUNTER — Inpatient Hospital Stay (HOSPITAL_COMMUNITY): Payer: Medicaid Other | Admitting: Speech Pathology

## 2013-04-13 ENCOUNTER — Inpatient Hospital Stay (HOSPITAL_COMMUNITY): Payer: Self-pay | Admitting: *Deleted

## 2013-04-13 ENCOUNTER — Inpatient Hospital Stay (HOSPITAL_COMMUNITY): Payer: Medicaid Other | Admitting: Occupational Therapy

## 2013-04-13 NOTE — Progress Notes (Signed)
Occupational Therapy Discharge Summary  Patient Details  Name: Eric May MRN: [id number] Date of Birth: 05-01-1960  Today's Date: 04/13/2013  Patient has met 12 of 12 long term goals due to improved activity tolerance, improved balance, postural control, ability to compensate for deficits, functional use of  LEFT upper and LEFT lower extremity, improved attention, improved awareness and improved coordination.  Patient to discharge at overall Supervision level.  Patient's care partner is independent to provide the necessary cognitive assistance at discharge.    Reasons goals not met: N/A  Recommendation:  Patient will benefit from ongoing skilled OT services in home health setting to continue to advance functional skills in the area of BADL and Reduce care partner burden.  Equipment: BSC and tub bench  Reasons for discharge: treatment goals met and discharge from hospital  Patient/family agrees with progress made and goals achieved: Yes  OT Discharge Precautions/Restrictions  Precautions Precautions: Fall Restrictions Weight Bearing Restrictions: No Pain Pain Assessment Pain Assessment: No/denies pain ADL  See FIM Vision/Perception  Vision - History Patient Visual Report: Blurring of vision;Eye fatigue/eye pain/headache;Undershooting Vision - Assessment Additional Comments: Rt gaze preference, however is able to compensate with head turns Perception Perception: Impaired Inattention/Neglect:  (decreased attention to Lt, however aware of this and able to scan to Lt) Praxis Praxis: Impaired Praxis Impairment Details: Perseveration  Cognition Overall Cognitive Status: Impaired/Different from baseline Arousal/Alertness: Awake/alert Orientation Level: Oriented X4 Attention: Alternating;Divided Alternating Attention: Appears intact Divided Attention: Impaired Divided Attention Impairment: Verbal complex;Functional complex Memory: Impaired Memory Impairment: Decreased  recall of new information Decreased Short Term Memory: Verbal complex;Functional complex Awareness: Impaired Awareness Impairment: Anticipatory impairment Problem Solving: Impaired Problem Solving Impairment: Verbal complex;Functional complex Executive Function: Self Monitoring;Self Correcting Self Monitoring: Impaired Self Monitoring Impairment: Verbal complex;Functional complex Self Correcting: Impaired Self Correcting Impairment: Verbal complex;Functional complex Safety/Judgment: Appears intact Sensation Sensation Light Touch: Impaired Detail (improved from eval) Light Touch Impaired Details: Impaired LUE;Impaired LLE Stereognosis: Not tested Hot/Cold: Not tested Proprioception: Impaired by gross assessment (improving) Coordination Fine Motor Movements are Fluid and Coordinated: No Motor  Motor Motor: Hemiplegia Motor - Discharge Observations: Still presents with L sided weakness and impaired motor control, timing, sequencing and coordination Mobility  Bed Mobility Bed Mobility: Supine to Sit;Sit to Supine Supine to Sit: 6: Modified independent (Device/Increase time) Sit to Supine: 6: Modified independent (Device/Increase time)  Trunk/Postural Assessment  Cervical Assessment Cervical Assessment: Within Functional Limits Thoracic Assessment Thoracic Assessment: Within Functional Limits Lumbar Assessment Lumbar Assessment: Within Functional Limits Postural Control Postural Control:  (impaired balance reactions in standing)  Balance Static Sitting Balance Static Sitting - Level of Assistance: 7: Independent Dynamic Sitting Balance Dynamic Sitting - Level of Assistance: 7: Independent Static Standing Balance Static Standing - Balance Support: No upper extremity supported Static Standing - Level of Assistance: 5: Stand by assistance Dynamic Standing Balance Dynamic Standing - Balance Support: Left upper extremity supported;Right upper extremity supported Dynamic  Standing - Level of Assistance: 5: Stand by assistance Extremity/Trunk Assessment RUE Assessment RUE Assessment: Within Functional Limits LUE Assessment LUE Assessment: Exceptions to WFL LUE AROM (degrees) LUE Overall AROM Comments: Pt with full ROM against gravity except shoulder approx 120 degrees, functional use in self-care tasks but decreased during assessment LUE Strength LUE Overall Strength Comments: strength grossly 3+/5  See FIM for current functional status  Kym Fenter, [facility] 04/13/2013, 11:47 AM

## 2013-04-13 NOTE — Discharge Summary (Signed)
Discharge summary job [id number]

## 2013-04-13 NOTE — Progress Notes (Signed)
53 y.o. right-handed male with history of hypertension, tobacco and alcohol abuse. Admitted 02/23/2013 when patient developed left-sided numbness and weakness while at work. Patient reports falling at that time due to weakness on the left. Subjective/Complaints: Left Ear pain better last noc Excited about D/C Review of Systems -neg except as above Objective: Vital Signs: Blood pressure 128/86, pulse 69, temperature 97.8 F (36.6 C), temperature source Oral, resp. rate 18, height 6' (1.829 m), weight 62.234 kg (137 lb 3.2 oz), SpO2 99.00%. No results found. No results found for this or any previous visit (from the past 72 hour(s)).    Constitutional: He appears alert, no distress.  HENT: poor dentition,  cerumenosis occlusive unable to visualize TM yet Head: Normocephalic. atraumatic Eyes:  Pupils EOMI, without nystagmus  Neck: Normal range of motion. Neck supple. No thyromegaly present.  Cardiovascular: Normal rate and regular rhythm. No murmurs  Respiratory: Effort normal and breath sounds normal. No respiratory distress. No wheezes, rales, rhonchi  GI: Soft. Bowel sounds are normal. He exhibits no distension.  Neurological:  . Oriented to name and hosp. He did follow one and two step commands.[name]. 4-5 left shoulder ,grip was 3-/5. LLE was 4-/5 with HF and KE and trace at the ankle.  Speech moderately dysarthric.  RUE 5/5 RLE 5/5 Skin: Skin is warm and dry.  Psychiatric:  Flat,     Assessment/Plan: 1. 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 Physiatrist is providing close team supervision and 24 hour management of active medical problems listed below. Physiatrist and rehab team continue to assess barriers to discharge/monitor patient progress toward functional and medical goals . FIM: FIM - Bathing Bathing Steps  Patient Completed: Chest;Abdomen;Left Arm;Front perineal area;Right Arm;Buttocks;Right upper leg;Left upper leg;Right lower leg (including foot);Left lower leg (including foot) Bathing: 5: Supervision: Safety issues/verbal cues  FIM - Upper Body Dressing/Undressing Upper body dressing/undressing steps patient completed: Thread/unthread right sleeve of pullover shirt/dresss;Thread/unthread left sleeve of pullover shirt/dress;Put head through opening of pull over shirt/dress;Pull shirt over trunk Upper body dressing/undressing: 5: Set-up assist to: Obtain clothing/put away FIM - Lower Body Dressing/Undressing Lower body dressing/undressing steps patient completed: Thread/unthread right underwear leg;Thread/unthread left underwear leg;Pull underwear up/down;Thread/unthread right pants leg;Thread/unthread left pants leg;Pull pants up/down;Fasten/unfasten pants;Don/Doff right sock;Don/Doff left sock;Don/Doff right shoe;Fasten/unfasten right shoe;Fasten/unfasten left shoe Lower body dressing/undressing: 4: Min-Patient completed 75 plus % of tasks  FIM - Toileting Toileting steps completed by patient: Adjust clothing prior to toileting;Performs perineal hygiene;Adjust clothing after toileting Toileting Assistive Devices: Grab bar or rail for support Toileting: 5: Supervision: Safety issues/verbal cues  FIM - Diplomatic Services operational officerToilet Transfers Toilet Transfers Assistive Devices: Best boyWalker;Grab bars Toilet Transfers: 5-To toilet/BSC: Supervision (verbal cues/safety issues);5-From toilet/BSC: Supervision (verbal cues/safety issues)  FIM - BankerBed/Chair Transfer Bed/Chair Transfer Assistive Devices: Walker;Arm rests Bed/Chair Transfer: 5: Bed > Chair or W/C: Supervision (verbal cues/safety issues);5: Chair or W/C > Bed: Supervision (verbal cues/safety issues)  FIM - Locomotion: Wheelchair Distance: 150 Locomotion: Wheelchair: 0: Activity did not occur FIM - Locomotion: Ambulation Locomotion: Ambulation Assistive Devices:  Orthosis;Walker - Rolling Ambulation/Gait Assistance: 5: Supervision Locomotion: Ambulation: 5: Travels 150 ft or more with supervision/safety issues  Comprehension Comprehension Mode: Auditory Comprehension: 5-Follows basic conversation/direction: With no assist  Expression Expression Mode: Verbal Expression: 5-Expresses complex 90% of the time/cues < 10% of the time  Social Interaction Social Interaction: 6-Interacts appropriately with others with medication or extra time (  anti-anxiety, antidepressant).  Problem Solving Problem Solving: 5-Solves complex 90% of the time/cues < 10% of the time  Memory Memory: 5-Recognizes or recalls 90% of the time/requires cueing < 10% of the time  Medical Problem List and Plan:  1. 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 DVT Prophylaxis/Anticoagulation: SCDs. Monitor for any signs of DVT  3. Pain Management: Tylenol as needed. 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 Neuropsych: This patient is not capable of making decisions on his own behalf.  5. Seizure prophylaxis. Keppra 500 mg twice a day. Monitor for any seizure activity  6. Hypertension. Norvasc 10 mg daily,D/C lisinopril 2.5, propranolol  20mg     TID                          7. Dysphagia.[name] dysphagia 3 thin liquids. Followup speech therapy ,appetitie improved off  megace ,cont Remeron 8. History of tobacco alcohol abuse. Urine drug screen positive cocaine and marijuana. Will reduce dose of nic patch 9.  Rectal pain,resolved 10.  Mood- improved on remeron 11.  Ear pain Left , wax visible in ext canal , Increase debrox 10gtt Left ear BID for additional 2 days, cerumen clearing , may need to follow with corticosporin gtt     LOS (Days) 42 A FACE TO FACE EVALUATION WAS PERFORMED   KIRSTEINS,ANDREW  E 04/13/2013, 8:14 AM

## 2013-04-13 NOTE — Progress Notes (Signed)
Social Work Patient ID: Eric May, male   DOB: Oct 03, 1960, 53 y.o.   MRN: [id number] Spoke with [name] via telephone and to pt to discuss team conference and discharge tomorrow.  [name] can be here tomorrow at 11;00 to finish family education. DME and follow up arranged.  Will set up pt with [facility] for medicines for one month.  He is very excited to go home tomorrow.  [name] aware and can Provide 24 hr supervision.  Work toward discharge tomorrow.

## 2013-04-13 NOTE — Discharge Summary (Signed)
NAMRosana May:  Clayburn, Liam             ACCOUNT NO.:  [id number]  MEDICAL RECORD NO.:  [id number]  LOCATION:  4W11C                        FACILITY:  [facility]  PHYSICIAN:  Erick ColaceAndrew E. Kirsteins, M.D.DATE OF BIRTH:  June 09, 1960  DATE OF ADMISSION:  03/02/2013 DATE OF DISCHARGE:  04/14/2013                              DISCHARGE SUMMARY   DISCHARGE DIAGNOSES: 1. 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 2. Sequential compression devices for DVT prophylaxis. 3. Seizure prophylaxis. 4. Hypertension. 5. Dysphagia. 6. History of tobacco and alcohol abuse. 7. Thrush.  HISTORY OF PRESENT ILLNESS:  This is a 53 year old right-handed male with history of hypertension, tobacco and alcohol abuse, admitted on February 23, 2013, when he developed left-sided numbness and weakness while at work.  Patient reports falling at that time due to weakness on the left.  Blood pressure 194/117.  CT of the head showed acute parenchymal hemorrhage over the right temporal parietal region measuring 4.9 x 3.2 cm with local mass effect and midline shift of 4 mm. Echocardiogram with ejection fraction of 65%.  No valvular abnormalities.  Carotid Dopplers with no ICA stenosis.  MRI of the brain, February 24, 2013 showed intraparenchymal hematoma with surrounding vasogenic edema.  Neurosurgery consulted, advised conservative care with routine follow cranial CT scans.  Urine drug screen on admission positive for cocaine as well as marijuana.  Maintained on Cardene drip for blood pressure control.  Diet of mechanical soft.  Keppra for seizure prophylaxis.  Physical and occupational therapy ongoing.  The patient was admitted for comprehensive rehab program.  PAST MEDICAL HISTORY:  See discharge diagnoses.  SOCIAL HISTORY:  Lives with girlfriend.  Functional status prior to admission, independent, working full time. Functional status upon admission to rehab services  was supervision to roll to the right, +2 total assist, supine to sit, +2 total assist sit to stand, +2 total assist stand pivot transfers.  PHYSICAL EXAMINATION:  VITAL SIGNS:  Blood pressure 152/102, pulse 60, temperature 98, respirations 13. GENERAL:  This was a lethargic male, in no distress.  He was arousable. He was oriented to name and year, but not date or month.  He did follow simple commands. HEENT:  Pupils round and reactive to light. LUNGS:  Clear to auscultation. CARDIAC:  Regular rate and rhythm. ABDOMEN:  Soft, nontender.  Good bowel sounds.  REHABILITATION HOSPITAL COURSE:  Patient was admitted to inpatient rehab services with therapies initiated on a 3-hour daily basis consisting of physical therapy, occupational therapy, speech therapy, and rehabilitation nursing.  The following issues were addressed during the patient's rehabilitation stay.  Pertaining to Mr. Talmage NapBennett's right temporal parietal ICH felt to be related to secondary malignant hypertension remained stable with progressive gains.  Close monitoring of blood pressure on Norvasc.  He continued on Keppra for seizure prophylaxis and no seizure activity noted.  He did have a history of tobacco abuse, maintained on a NicoDerm patch.  He received full counseling in regard to cessation of smoking, it was questionable he will be compliant with these request, as well as a urine drug screen was positive for cocaine and marijuana, again receiving full counseling. His diet had been  advanced to a mechanical soft to a regular consistency which he tolerated well.  He did have some left ear pain felt to be due to some wax impaction improved with the use of Debrox.  The patient received weekly collaborative interdisciplinary team conferences to discuss estimated length of stay, family teaching, and any barriers to discharge.  He performed gait training in a controlled environment, ambulating 300 feet x2 with supervision,  intermittent cues for safety. He ambulates to the bathroom supervision using a rolling walker. Completed grooming and dressing at sink side.  He received followed by Neuropsychology during his rehab stay noted some impairments and list learning as well as story, memory, picture naming, as well as line orientation, although these did continue to improve.  He exhibited poor insight into his deficits.  In regard to these findings, all issues discussed with the family on ongoing need for supervision at time of discharge.  Full family teaching completed and discharged on April 14, 2013.  DISCHARGE MEDICATIONS: 1. Xanax 0.25 mg p.o. t.i.d. as needed anxiety. 2. Norvasc 10 mg p.o. daily. 3. Folic acid 1 mg p.o. daily. 4. Neurontin 100 mg p.o. t.i.d. 5. Keppra 500 mg p.o. b.i.d. 6. Remeron 15 mg p.o. at bedtime. 7. Multivitamin daily. 8. NicoDerm patch taper as directed. 9. Inderal 20 mg p.o. t.i.d. 10.Seroquel 25 mg p.o. at bedtime. 11.Ultram 50 mg p.o. every 6 hours as needed moderate pain.  DIET:  Regular.  FOLLOWUP:  The patient would follow up with Dr. Claudette Laws as directed.  Follow up with the community [facility] on April 18, 2013.  Dr. Lisbeth Renshaw, Neurosurgery in 2 weeks call for appointment.  Dr. Delia Heady, in 1 month call for appointment.  SPECIAL INSTRUCTIONS:  No alcohol.  No smoking.  No illicit drugs. Ongoing supervision for patient safety.     Mariam Dollar, P.A.   ______________________________ Erick Colace, M.D.    DA/MEDQ  D:  04/13/2013  T:  04/13/2013  Job:  [id number]  cc:   Pramod P. Pearlean Brownie, MD Lisbeth Renshaw, MD

## 2013-04-13 NOTE — Progress Notes (Signed)
Speech Language Pathology Discharge Summary  Patient Details  Name: Eric May MRN: [id number] Date of Birth: Oct 12, 1960  Today's Date: 04/13/2013 Time: 1003-1030 Time Calculation (min): 27 min  Skilled Therapeutic Interventions: Skilled treatment session focused on wrap up of patient and family education.  SLP provided patient with written handout regarding home management recommendations.  Significant other unable to be present for session so SLP and patient called her to discuss and review information.  Following education discussion significant other able to verbalize understanding of information and patient required Supervision level question cues to recall recommendations and anticipate needs. Goals met, education completed, recommend discharge from SLP services.    Patient has met 5 of 5 long term goals.  Patient to discharge at overall Supervision level.  Reasons goals not met: n/a   Clinical Impression/Discharge Summary: Patient met 5 out of 5 long term goals during his CIR stay due to diet advancement and toleration and well as overall improved cognitive-linguistic abilities.  Upon admission patient required Max-Total assist to attend to and safely complete all basic self-care tasks.  At discharge patient is consuming regular textures and thin liquids with Mod I following set-up assist, is Mod I for basic seated tasks, Supervision for complex tasks such as medication management. Patient and family education has been completed with significant other and patient is ready for discharge. Given continued high level deficits follow-up services are recommended in the outpatient setting when mobility advances to that level.  Care Partner:  Caregiver Able to Provide Assistance: Yes  Type of Caregiver Assistance: Cognitive;Physical  Recommendation:  24 hour supervision/assistance;Outpatient SLP     Equipment: none   Reasons for discharge: Treatment goals met;Discharged from hospital    Patient/Family Agrees with Progress Made and Goals Achieved: Yes   See FIM for current functional status  Carmelia Roller., CCC-SLP [phone]  [place] 04/13/2013, 11:05 AM

## 2013-04-13 NOTE — Patient Care Conference (Signed)
Inpatient RehabilitationTeam Conference and Plan of Care Update Date: 04/13/2013   Time: 11;45 AM    Patient Name: Eric May      Medical Record Number: [id number]  Date of Birth: 1960/11/20 Sex: Male         Room/Bed: 4W11C/4W11C-01 Payor Info: Payor: MEDICAID PENDING / Plan: MEDICAID PENDING / Product Type: *No Product type* /    Admitting Diagnosis: ICH  Admit Date/Time:  03/02/2013  6:21 PM Admission Comments: No comment available   Primary Diagnosis:  <principal problem not specified> Principal Problem: <principal problem not specified>  Patient Active Problem List   Diagnosis Date Noted  . ICH (intracerebral hemorrhage) 03/02/2013  . Convulsions/seizures 02/24/2013  . Intracerebral hemorrhage 02/23/2013    Expected Discharge Date: Expected Discharge Date: 04/14/13  Team Members Present: Physician leading conference: Dr. Alysia Penna Social Worker Present: Alfonse Alpers, LCSW;Becky Oda [place], LCSW Nurse Present: Other (comment) Maudry Mayhew Rosero-RN) PT Present: Raylene Everts, PT OT Present: Simonne Come, OT;Kris Nira Retort, OT SLP Present: Gunnar Fusi, SLP PPS Coordinator present : Ileana Ladd, Lelan Pons, RN, CRRN     Current Status/Progress Goal Weekly Team Focus  Medical   BPs, Left ear pain improved  Medical follow up  D/C planning   Bowel/Bladder   cont of bowel and bladder, on bowel meds 2 tab senekot BID  remains cont of B&B. Urinal at HS.  monitor for constipation/diarrhea   Swallow/Nutrition/ Hydration   regular and thin      goals met   ADL's   supervision overall, except min assist LB dressing due to requiring assist to don Lt shoe with AFO  supervision-min assist  LUE NM re-ed, dynamic balance during self-care tasks   Mobility   Supervision transfers, w/c mobility and gait with RW, min A stairs and floor transfer  Goals upgraded; supervision transfers and w/c mobility, supervision gait, min A stairs, car transfer and floor transfer   Higher level balance and gait training, family education and D/C planning   Communication   Supervision      goals met, family education    Safety/Cognition/ Behavioral Observations  Supervision-Min assist   Min assist   goals met, family education   Pain   PRN tramadol for L ear pain; ear drops initiated to loosen earwax   pain < 3/10  assess pain and medicate appropriately   Skin   CDI  No new skin breakdown or infection  assess skin qshift and prn      *See Care Plan and progress notes for long and short-term goals.  Barriers to Discharge: weather related issueswith transpotation    Possible Resolutions to Barriers:  Home in am    Discharge Planning/Teaching Needs:  Home with [name]-girlfriend who can provide assist to pt.  Plan to go home tomorrow-connecting with community resources for medical follow up and home health      Team Discussion:  Ear better.  Finish up family education to prepare for discharge tomorrow.  [name] to provide 24 hr supervision to pt at home.  Pt excited to be going home  Revisions to Treatment Plan:  Discharged plan changed to home with girlfriend-[name].  Goals upgraded to supervision level   Continued Need for Acute Rehabilitation Level of Care: The patient requires daily medical management by a physician with specialized training in physical medicine and rehabilitation for the following conditions: Daily direction of a multidisciplinary physical rehabilitation program to ensure safe treatment while eliciting the highest outcome that is of practical value to  the patient.: Yes Daily medical management of patient stability for increased activity during participation in an intensive rehabilitation regime.: Yes Daily analysis of laboratory values and/or radiology reports with any subsequent need for medication adjustment of medical intervention for : Neurological problems  Eric May, Eric May 04/13/2013, 2:35 PM

## 2013-04-13 NOTE — Progress Notes (Signed)
Physical Therapy Discharge Summary  Patient Details  Name: Eric May MRN: [id number] Date of Birth: Mar 11, 1960  Today's Date: 04/13/2013 Time: 1311-1401 Time Calculation (min): 50 min  Patient has met 11 of 11 long term goals due to improved activity tolerance, improved balance, improved postural control, increased strength, decreased pain, ability to compensate for deficits, functional use of  left upper extremity and left lower extremity, improved attention, improved awareness and improved coordination.  Patient to discharge at an ambulatory level Supervision.   Patient's care partner is independent to provide the necessary physical, cognitive and supervision assistance at discharge.  Reasons goals not met: All goals met    Recommendation:  Patient will benefit from ongoing skilled PT services in home health setting to continue to advance safe functional mobility, address ongoing impairments in L sided hemiparesis, impaired attention, sensation and proprioception, impaired motor control, timing, sequencing and coordination, impaired postural control, balance, gait, and minimize fall risk.  Equipment: RW, Toe Off AFO, manual w/c  Reasons for discharge: treatment goals met and discharge from hospital  Patient/family agrees with progress made and goals achieved: Yes  PT Discharge Precautions/Restrictions Precautions Precautions: Fall Restrictions Weight Bearing Restrictions: No Vital Signs Therapy Vitals Temp: 97.8 F (36.6 C) Temp src: Oral Pulse Rate: 69 Resp: 18 BP: 128/86 mmHg Patient Position, if appropriate: Lying Oxygen Therapy SpO2: 99 % O2 Device: None (Room air) Pain Pain Assessment Pain Assessment: No/denies pain Vision/Perception  Vision - History Patient Visual Report: Blurring of vision;Eye fatigue/eye pain/headache;Undershooting Vision - Assessment Additional Comments: Rt gaze preference, however is able to compensate with head  turns Perception Perception: Impaired Inattention/Neglect:  (decreased attention to Lt, however aware of this and able to scan to Lt) Praxis Praxis: Impaired Praxis Impairment Details: Perseveration  Cognition Overall Cognitive Status: Impaired/Different from baseline Arousal/Alertness: Awake/alert Orientation Level: Oriented X4 Attention: Alternating;Divided Alternating Attention: Appears intact Divided Attention: Impaired Divided Attention Impairment: Verbal complex;Functional complex Memory: Impaired Memory Impairment: Decreased recall of new information Decreased Short Term Memory: Verbal complex;Functional complex Awareness: Impaired Awareness Impairment: Anticipatory impairment Problem Solving: Impaired Problem Solving Impairment: Verbal complex;Functional complex Executive Function: Self Monitoring;Self Correcting Self Monitoring: Impaired Self Monitoring Impairment: Verbal complex;Functional complex Self Correcting: Impaired Self Correcting Impairment: Verbal complex;Functional complex Safety/Judgment: Appears intact Sensation Sensation Light Touch: Impaired Detail (improved from eval) Light Touch Impaired Details: Impaired LUE;Impaired LLE Stereognosis: Not tested Hot/Cold: Not tested Proprioception: Impaired by gross assessment (improving) Coordination Fine Motor Movements are Fluid and Coordinated: No Motor  Motor Motor: Hemiplegia Motor - Discharge Observations: Still presents with L sided weakness and impaired motor control, timing, sequencing and coordination  Mobility Reviewed floor > furniture transfer with pt and indications for calling EMS.  Pt able to verbalize and demonstrate safe floor >furniture transfer with supervision.   Bed Mobility Bed Mobility: Supine to Sit;Sit to Supine Supine to Sit: 6: Modified independent (Device/Increase time) Sit to Supine: 6: Modified independent (Device/Increase time) Transfers Stand Pivot Transfers: 6: Modified  independent (Device/Increase time) (with UE support on RW; supervision without AD) Locomotion  Ambulation Ambulation/Gait Assistance: 5: Supervision Ambulation Distance (Feet): 200 Feet; also performed gait with dual task of head turns, changes in gait speed and added cognitive activity.  Pt still only required supervision but velocity slowed and pt demonstrated step to gait sequence.   Assistive device: Rolling walker;Other (Comment) (Toe Off Brace) Gait Gait Pattern: Step-to pattern;Decreased step length - right;Decreased stance time - left;Decreased stride length;Decreased dorsiflexion - left;Decreased weight shift to left;Decreased trunk rotation;Narrow base of  support Stairs / Additional Locomotion Stairs: Yes Stairs Assistance: 4: Min assist but no verbal cues needed for sequencing Stair Management Technique: 1 rail on R;Step to pattern;Forwards Number of Stairs: 36+ Height of Stairs: 6.5 Wheelchair Mobility Wheelchair Mobility: No (ambulatory on unit)  Trunk/Postural Assessment  Cervical Assessment Cervical Assessment: Within Functional Limits Thoracic Assessment Thoracic Assessment: Within Functional Limits Lumbar Assessment Lumbar Assessment: Within Functional Limits Postural Control Postural Control:  (impaired balance reactions in standing)  Balance Static Sitting Balance Static Sitting - Level of Assistance: 7: Independent Dynamic Sitting Balance Dynamic Sitting - Level of Assistance: 7: Independent Static Standing Balance Static Standing - Balance Support: No upper extremity supported Static Standing - Level of Assistance: 5: Stand by assistance Dynamic Standing Balance Dynamic Standing - Balance Support: Left upper extremity supported;Right upper extremity supported Dynamic Standing - Level of Assistance: 5: Stand by assistance Extremity Assessment  RLE Assessment RLE Assessment: Within Functional Limits LLE Assessment LLE Assessment: Exceptions to [facility] LLE  Strength LLE Overall Strength: Deficits LLE Overall Strength Comments: 3/5 hip flexion and knee extension, 2/5 ankle DF, 4/5 knee flexion  See FIM for current functional status  Eric May [facility] 04/13/2013, 8:37 AM

## 2013-04-13 NOTE — Discharge Instructions (Signed)
Inpatient Rehab Discharge Instructions  Eric May Discharge date and time: No discharge date for patient encounter.   Activities/Precautions/ Functional Status: Activity: activity as tolerated Diet: regular diet Wound Care: none needed Functional status:  ___ No restrictions     ___ Walk up steps independently _x__ 24/7 supervision/assistance   ___ Walk up steps with assistance ___ Intermittent supervision/assistance  ___ Bathe/dress independently ___ Walk with walker     ___ Bathe/dress with assistance ___ Walk Independently    ___ Shower independently __x_ Walk with assistance    ___ Shower with assistance ___ No alcohol     ___ Return to work/school ________  Special Instructions:     COMMUNITY REFERRALS UPON DISCHARGE:    Home Health:   PT, OT, SPT, RN, SW  Agency:[facility] [phone] Date of last service:04/14/2013  Medical Equipment/Items Ordered:WHEELCHAIR, Levan HurstROLLING WALKER, [facility], TUB BENCH  Agency/Supplier:[facility]    [phone] Other:: [facility] 2/23  9;00 AM APPOINTMENT [address] [place] [phone]   [phone] [facility]  [phone] EXT  [phone] DISABILITY APPLICATION FOLLOW UP   GENERAL COMMUNITY RESOURCES FOR PATIENT/FAMILY: Support Groups:[facility] SUPPORT GROUP  My questions have been answered and I understand these instructions. I will adhere to these goals and the provided educational materials after my discharge from the hospital.  Patient/Caregiver Signature _______________________________ Date __________  Clinician Signature _______________________________________ Date __________  Please bring this form and your medication list with you to all your follow-up doctor's appointments.  STROKE/TIA DISCHARGE INSTRUCTIONS SMOKING Cigarette smoking nearly doubles your risk of having a stroke & is the single most alterable risk factor  If you smoke or have smoked in the last 12 months, you are advised to quit smoking  for your health.  Most of the excess cardiovascular risk related to smoking disappears within a year of stopping.  Ask you doctor about anti-smoking medications  [place] Quit Line: 1-800-QUIT NOW  Free Smoking Cessation Classes [phone]  CHOLESTEROL Know your levels; limit fat & cholesterol in your diet  Lipid Panel     Component Value Date/Time   CHOL 219* 02/26/2013 0420   TRIG 63 02/26/2013 0420   HDL 96 02/26/2013 0420   CHOLHDL 2.3 02/26/2013 0420   VLDL 13 02/26/2013 0420   LDLCALC 110* 02/26/2013 0420      Many patients benefit from treatment even if their cholesterol is at goal.  Goal: Total Cholesterol (CHOL) less than 160  Goal:  Triglycerides (TRIG) less than 150  Goal:  HDL greater than 40  Goal:  LDL (LDLCALC) less than 100   BLOOD PRESSURE American Stroke Association blood pressure target is less that 120/80 mm/Hg  Your discharge blood pressure is:  BP: 112/73 mmHg  Monitor your blood pressure  Limit your salt and alcohol intake  Many individuals will require more than one medication for high blood pressure  DIABETES (A1c is a blood sugar average for last 3 months) Goal HGBA1c is under 7% (HBGA1c is blood sugar average for last 3 months)  Diabetes: No known diagnosis of diabetes    No results found for this basename: HGBA1C     Your HGBA1c can be lowered with medications, healthy diet, and exercise.  Check your blood sugar as directed by your physician  Call your physician if you experience unexplained or low blood sugars.  PHYSICAL ACTIVITY/REHABILITATION Goal is 30 minutes at least 4 days per week  Activity: No driving, Therapies: Physical Therapy: Home Health Return to  work:   Activity decreases your risk of heart attack and stroke and makes your heart stronger.  It helps control your weight and blood pressure; helps you relax and can improve your mood.  Participate in a regular exercise program.  Talk with your doctor about the best form of exercise for  you (dancing, walking, swimming, cycling).  DIET/WEIGHT Goal is to maintain a healthy weight  Your discharge diet is: General  liquids Your height is:  Height: 6' (182.9 cm) Your current weight is: Weight: 62.234 kg (137 lb 3.2 oz) Your Body Mass Index (BMI) is:  BMI (Calculated): 18  Following the type of diet specifically designed for you will help prevent another stroke.  Your goal weight range is:    Your goal Body Mass Index (BMI) is 19-24.  Healthy food habits can help reduce 3 risk factors for stroke:  High cholesterol, hypertension, and excess weight.  RESOURCES Stroke/Support Group:  Call [phone]   STROKE EDUCATION PROVIDED/REVIEWED AND GIVEN TO PATIENT Stroke warning signs and symptoms How to activate emergency medical system (call 911). Medications prescribed at discharge. Need for follow-up after discharge. Personal risk factors for stroke. Pneumonia vaccine given:  Flu vaccine given:  My questions have been answered, the writing is legible, and I understand these instructions.  I will adhere to these goals & educational materials that have been provided to me after my discharge from the hospital.

## 2013-04-13 NOTE — Progress Notes (Signed)
Occupational Therapy Session Note  Patient Details  Name: Gerre Pebblesnthony Kimmel MRN: [id number] Date of Birth: 24-Mar-1960  Today's Date: 04/13/2013 Time: 0830-0930 Time Calculation (min): 60 min  Short Term Goals: No short term goals set  Skilled Therapeutic Interventions/Progress Updates:    Pt completed self-care retraining at overall supervision.  Pt ambulated to bathroom with RW and completed bathing at sit > stand level in room shower.  Pt able to complete all dressing tasks, including socks, shoes, and AFO with supervision.  Pt required increased time with donning AFO and tying shoes.  Engaged in tub/shower transfer in ADL tubroom with stepping over tub ledge with use of grab bars to sit on shower chair, pt required min assist and mod verbal cues for sequencing.  Introduced tub bench for transfer with pt able to complete transfer with close supervision with only demonstration cue.  Recommend tub transfer bench for increased safety and independence with transfers, pt agreeable to recommendation.  Therapy Documentation Precautions:  Precautions Precautions: Fall Precaution Comments: pt con't to be agitated at onset of pain Restrictions Weight Bearing Restrictions: No Pain: Pain Assessment Pain Assessment: No/denies pain  See FIM for current functional status  Therapy/Group: Individual Therapy  [name], Ourania Hamler 04/13/2013, 11:31 AM

## 2013-04-14 ENCOUNTER — Inpatient Hospital Stay (HOSPITAL_COMMUNITY): Payer: Medicaid Other

## 2013-04-14 ENCOUNTER — Encounter (HOSPITAL_COMMUNITY): Payer: Self-pay | Admitting: Occupational Therapy

## 2013-04-14 DIAGNOSIS — G8110 Spastic hemiplegia affecting unspecified side: Secondary | ICD-10-CM

## 2013-04-14 DIAGNOSIS — R209 Unspecified disturbances of skin sensation: Secondary | ICD-10-CM

## 2013-04-14 DIAGNOSIS — I619 Nontraumatic intracerebral hemorrhage, unspecified: Secondary | ICD-10-CM

## 2013-04-14 DIAGNOSIS — I69998 Other sequelae following unspecified cerebrovascular disease: Secondary | ICD-10-CM

## 2013-04-14 MED ORDER — MIRTAZAPINE 15 MG PO TABS: 15.0000 mg | ORAL_TABLET | Freq: Every day | ORAL | Status: DC

## 2013-04-14 MED ORDER — NICOTINE 14 MG/24HR TD PT24: MEDICATED_PATCH | TRANSDERMAL | Status: DC

## 2013-04-14 MED ORDER — ADULT MULTIVITAMIN W/MINERALS CH: 1.0000 | ORAL_TABLET | Freq: Every day | ORAL | Status: AC

## 2013-04-14 MED ORDER — TRAMADOL HCL 50 MG PO TABS: 50.0000 mg | ORAL_TABLET | Freq: Four times a day (QID) | ORAL | Status: AC | PRN

## 2013-04-14 MED ORDER — AMLODIPINE BESYLATE 10 MG PO TABS: 10.0000 mg | ORAL_TABLET | Freq: Every day | ORAL | Status: DC

## 2013-04-14 MED ORDER — QUETIAPINE FUMARATE 25 MG PO TABS: 25.0000 mg | ORAL_TABLET | Freq: Every day | ORAL | Status: DC

## 2013-04-14 MED ORDER — ALPRAZOLAM 0.25 MG PO TABS: 0.2500 mg | ORAL_TABLET | Freq: Three times a day (TID) | ORAL | Status: AC | PRN

## 2013-04-14 MED ORDER — GABAPENTIN 100 MG PO CAPS: 100.0000 mg | ORAL_CAPSULE | Freq: Three times a day (TID) | ORAL | Status: DC

## 2013-04-14 MED ORDER — LEVETIRACETAM 500 MG PO TABS: 500.0000 mg | ORAL_TABLET | Freq: Two times a day (BID) | ORAL | Status: DC

## 2013-04-14 MED ORDER — PROPRANOLOL HCL 20 MG PO TABS: 20.0000 mg | ORAL_TABLET | Freq: Three times a day (TID) | ORAL | Status: DC

## 2013-04-14 NOTE — Progress Notes (Signed)
Physical Therapy Note  Patient Details  Name: Eric May MRN: [id number] Date of BirGerre Pebblesth: 1960-08-14 Today's Date: 04/14/2013  11:30 - 12:00 30 minutes  Individual session Patient denies pain. Patient reports that he is ready for discharge.  Treatment session focused on caregiver education with girlfriend, [name]. Patient's equipment present - rolling walker adjusted to correct height, foot rests on wheelchair adjusted to appropriate length. Patient's girlfriend demonstrated how to fold/unfold wheelchair and remove/put on footrests. Patient ambulated 300+ feet with RW, AFO, and distant supervision. Patient demonstrated standing <> floor transfer with supervision. Patient demonstrated car transfer without assistance. Patient ambulated up and down 4 steps with HHA from girlfriend. Discussed maintaining activity at home, safety with icy weather and with rugs, etc. Inside home. Patient and [name] reported having no further questions. Patient left in room with PA to provide discharge instructions.   [name], Benjiman Sedgwick M 04/14/2013, 12:08 PM

## 2013-04-14 NOTE — Progress Notes (Signed)
Occupational Therapy Session Note  Patient Details  Name: Eric May MRN: [id number] Date of Birth: August 03, 1960  Today's Date: 04/14/2013 Time: 1100-1130 Time Calculation (min): 30 min  Short Term Goals: No short term goals set  Skilled Therapeutic Interventions/Progress Updates:    Pt seen for hands on family education with girlfriend, [name] prior to d/c home.  Pt ambulated to ADL tubroom with RW with supervision and min cues for step through pattern.  Pt demonstrated tub/shower transfer with tub transfer bench without any verbal cues for sequencing or safety.  Encouraged pt to continue to complete self-care tasks without assistance unless unsafe or becoming frustrated.  Girlfriend reports pleased with his progress and plans to encourage him to attempt tasks before stepping in to assist, as long as safe.  Reiterated supervision level goals. Pt and girlfriend, report no further questions.   Therapy Documentation Precautions:  Precautions Precautions: Fall Precaution Comments: pt con't to be agitated at onset of pain Restrictions Weight Bearing Restrictions: No Pain:  Pt with no c/o pain  See FIM for current functional status  Therapy/Group: Individual Therapy  [name], Tamari Busic 04/14/2013, 12:00 PM

## 2013-04-14 NOTE — Progress Notes (Signed)
Recreational Therapy Discharge Summary Patient Details  Name: Eric May MRN: [id number] Date of Birth: 05/11/1960 Today's Date: 04/14/2013  Long term goals set: 2 Long term goals met: 2  Comments on progress toward goals: Pt has made great progress toward goals and is ready for discharge home today with girlfriend to provide/coordinate 24 hour supervision.  Pt is discharging at supervision ambulatory level and close supervision-min assist for community pursuits.  Education & discussion provided on healthy uses of leisure time post discharge.  Reasons for discharge: discharge from hospital Patient/family agrees with progress made and goals achieved: Yes  Preet Mangano 04/14/2013, 10:55 AM

## 2013-04-14 NOTE — Progress Notes (Signed)
Social Work Discharge Note Discharge Note  The overall goal for the admission was met for:   Discharge location: Yes-HOME WITH [name]-WHO CAN PROVIDE 24 HR SUPERVISION LEVEL  Length of Stay: No-43 DAYS  Discharge activity level: Yes-SUPERVISION LEVEL  Home/community participation: Yes  Services provided included: MD, RD, PT, OT, SLP, RN, CM, TR, Pharmacy, Neuropsych and SW  Financial Services: Other: MEDICAID PENDING  Follow-up services arranged: Home Health: [facility]-PT,OT,SPT,RN,SW, DME: [facility]-WHEELCHAIR, ROLLING WALKER, BSC, TUB BENCH and Patient/Family has no preference for HH/DME agencies  Comments (or additional information):FAMILY [place] FEELS CONFIDENT WITH PT'S CARE-SHE WAS A CNA Bee WITH MEDS.  [place] 2/23 9;00 APPOINTMENT FOR MEDICAL FOLLOW UP [facility] [phone] EXT [phone]  Patient/Family verbalized understanding of follow-up arrangements: Yes  Individual responsible for coordination of the follow-up plan: [name]-GIRLFRIEND  Confirmed correct DME delivered: Elease Hashimoto 04/14/2013    Elease Hashimoto

## 2013-04-14 NOTE — Progress Notes (Signed)
Pt. Got d/c instructions,prescriptions and follow up appointments.Pt. Ready to go home with family. 

## 2013-04-14 NOTE — Progress Notes (Addendum)
53 y.o. right-handed male with history of hypertension, tobacco and alcohol abuse. Admitted 02/23/2013 when patient developed left-sided numbness and weakness while at work. Patient reports falling at that time due to weakness on the left. Subjective/Complaints: Left Ear pain resolved Excited about D/C Review of Systems -neg except as above Objective: Vital Signs: Blood pressure 117/74, pulse 88, temperature 98 F (36.7 C), temperature source Oral, resp. rate 20, height 6' (1.829 m), weight 64 kg (141 lb 1.5 oz), SpO2 98.00%. No results found. No results found for this or any previous visit (from the past 72 hour(s)).    Constitutional: He appears alert, no distress.  HENT: poor dentition,   Head: Normocephalic. atraumatic Eyes:  Pupils EOMI, without nystagmus  Neck: Normal range of motion. Neck supple. No thyromegaly present.  Cardiovascular: Normal rate and regular rhythm. No murmurs  Respiratory: Effort normal and breath sounds normal. No respiratory distress. No wheezes, rales, rhonchi  GI: Soft. Bowel sounds are normal. He exhibits no distension.  Neurological:  . Oriented to name and hosp. He did follow one and two step commands.[name]. 4-5 left shoulder ,grip was 3-/5. LLE was 4-/5 with HF and KE and trace at the ankle.  Speech moderately dysarthric.  RUE 5/5 RLE 5/5 Skin: Skin is warm and dry.  Psychiatric:  Flat,     Assessment/Plan: 1. 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 FIM: FIM - Bathing Bathing Steps Patient Completed: Chest;Abdomen;Left Arm;Front perineal area;Right Arm;Buttocks;Right upper leg;Left upper leg;Right lower leg (including foot);Left lower leg (including foot) Bathing: 5: Supervision: Safety issues/verbal cues  FIM - Upper Body Dressing/Undressing Upper body dressing/undressing steps  patient completed: Thread/unthread right sleeve of pullover shirt/dresss;Thread/unthread left sleeve of pullover shirt/dress;Put head through opening of pull over shirt/dress;Pull shirt over trunk Upper body dressing/undressing: 5: Set-up assist to: Obtain clothing/put away FIM - Lower Body Dressing/Undressing Lower body dressing/undressing steps patient completed: Thread/unthread right underwear leg;Thread/unthread left underwear leg;Pull underwear up/down;Thread/unthread right pants leg;Thread/unthread left pants leg;Pull pants up/down;Fasten/unfasten pants;Don/Doff right sock;Don/Doff left sock;Don/Doff right shoe;Fasten/unfasten right shoe;Fasten/unfasten left shoe;Don/Doff left shoe Lower body dressing/undressing: 5: Set-up assist to: Obtain clothing  FIM - Toileting Toileting steps completed by patient: Adjust clothing prior to toileting;Performs perineal hygiene;Adjust clothing after toileting Toileting Assistive Devices: Grab bar or rail for support Toileting: 5: Supervision: Safety issues/verbal cues  FIM - Diplomatic Services operational officerToilet Transfers Toilet Transfers Assistive Devices: Best boyWalker;Grab bars Toilet Transfers: 5-To toilet/BSC: Supervision (verbal cues/safety issues);5-From toilet/BSC: Supervision (verbal cues/safety issues)  FIM - BankerBed/Chair Transfer Bed/Chair Transfer Assistive Devices: Walker;Arm rests Bed/Chair Transfer: 6: Supine > Sit: No assist;6: Sit > Supine: No assist;6: Bed > Chair or W/C: No assist;6: Chair or W/C > Bed: No assist  FIM - Locomotion: Wheelchair Distance: 150 Locomotion: Wheelchair: 0: Activity did not occur (ambulatory on the unit) FIM - Locomotion: Ambulation Locomotion: Ambulation Assistive Devices: Orthosis;Walker - Rolling Ambulation/Gait Assistance: 5: Supervision Locomotion: Ambulation: 5: Travels 150 ft or more with supervision/safety issues  Comprehension Comprehension Mode: Auditory Comprehension: 5-Follows basic conversation/direction: With no  assist  Expression Expression Mode: Verbal Expression: 5-Expresses complex 90% of the time/cues < 10% of the time  Social Interaction Social Interaction: 6-Interacts appropriately with others with medication or extra time (anti-anxiety, antidepressant).  Problem Solving Problem Solving: 5-Solves complex 90% of the time/cues < 10% of the time  Memory Memory: 5-Recognizes or recalls 90% of the  time/requires cueing < 10% of the time  Medical Problem List and Plan:  1. 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 DVT Prophylaxis/Anticoagulation: SCDs. Monitor for any signs of DVT  3. Pain Management: Tylenol as needed. 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 Neuropsych: This patient is not capable of making decisions on his own behalf.  5. Seizure prophylaxis. Keppra 500 mg twice a day. Monitor for any seizure activity  6. Hypertension. ControlledNorvasc 10 mg daily,D/C lisinopril 2.5, propranolol  20mg     TID                          7. Dysphagia.[name] dysphagia 3 thin liquids. Followup speech therapy ,appetitie improved off  megace ,cont Remeron 8. History of tobacco alcohol abuse. Urine drug screen positive cocaine and marijuana. Will reduce dose of nic patch 9.  Rectal pain,resolved 10.  Mood- improved on remeron 11.  Ear pain Left , improved after cerumen removal    LOS (Days) 43 A FACE TO FACE EVALUATION WAS PERFORMED   Neri Samek E 04/14/2013, 6:36 AM

## 2013-04-29 DIAGNOSIS — I69920 Aphasia following unspecified cerebrovascular disease: Secondary | ICD-10-CM

## 2013-04-29 DIAGNOSIS — I69959 Hemiplegia and hemiparesis following unspecified cerebrovascular disease affecting unspecified side: Secondary | ICD-10-CM

## 2013-04-29 DIAGNOSIS — I10 Essential (primary) hypertension: Secondary | ICD-10-CM

## 2013-05-03 ENCOUNTER — Telehealth: Payer: Self-pay

## 2013-05-03 NOTE — Telephone Encounter (Signed)
Okay for home health social work

## 2013-05-03 NOTE — Telephone Encounter (Signed)
Mindi JunkerMarsha a Child psychotherapistsocial worker called to get orders to go back out to patients home to help him get money for medications.

## 2013-05-03 NOTE — Telephone Encounter (Signed)
Verbal orders given to Mindi JunkerMarsha to have social worker assist patient.

## 2013-05-12 ENCOUNTER — Encounter: Payer: Self-pay | Admitting: Neurology

## 2013-05-12 ENCOUNTER — Ambulatory Visit (INDEPENDENT_AMBULATORY_CARE_PROVIDER_SITE_OTHER): Payer: Self-pay | Admitting: Neurology

## 2013-05-12 VITALS — BP 132/89 | HR 71 | Ht 72.0 in | Wt 150.0 lb

## 2013-05-12 DIAGNOSIS — G936 Cerebral edema: Secondary | ICD-10-CM | POA: Insufficient documentation

## 2013-05-12 DIAGNOSIS — I10 Essential (primary) hypertension: Secondary | ICD-10-CM | POA: Insufficient documentation

## 2013-05-12 NOTE — Patient Instructions (Signed)
I had a long discussion the patient and wife Re: is brain hemorrhage and his residual left hemiparesis and need to be compliant with antihypertensives and answered questions. Strict control of hypertension with blood pressure goal below 130/90. I complimented him on quitting cocaine and advise him to quit smoking completely as well. Continue outpatient physical and occupational therapy. Return for followup in 3 months with Heide GuileLynn Lam,, nurse practitioner call earlier if necessary.  Stroke Prevention Some medical conditions and behaviors are associated with an increased chance of having a stroke. You may prevent a stroke by making healthy choices and managing medical conditions. HOW CAN I REDUCE MY RISK OF HAVING A STROKE?   Stay physically active. Get at least 30 minutes of activity on most or all days.  Do not smoke. It may also be helpful to avoid exposure to secondhand smoke.  Limit alcohol use. Moderate alcohol use is considered to be:  No more than 2 drinks per day for men.  No more than 1 drink per day for nonpregnant women.  Eat healthy foods. This involves  Eating 5 or more servings of fruits and vegetables a day.  Following a diet that addresses high blood pressure (hypertension), high cholesterol, diabetes, or obesity.  Manage your cholesterol levels.  A diet low in saturated fat, trans fat, and cholesterol and high in fiber may control cholesterol levels.  Take any prescribed medicines to control cholesterol as directed by your health care provider.  Manage your diabetes.  A controlled-carbohydrate, controlled-sugar diet is recommended to manage diabetes.  Take any prescribed medicines to control diabetes as directed by your health care provider.  Control your hypertension.  A low-salt (sodium), low-saturated fat, low-trans fat, and low-cholesterol diet is recommended to manage hypertension.  Take any prescribed medicines to control hypertension as directed by your  health care provider.  Maintain a healthy weight.  A reduced-calorie, low-sodium, low-saturated fat, low-trans fat, low-cholesterol diet is recommended to manage weight.  Stop drug abuse.  Avoid taking birth control pills.  Talk to your health care provider about the risks of taking birth control pills if you are over 51 years old, smoke, get migraines, or have ever had a blood clot.  Get evaluated for sleep disorders (sleep apnea).  Talk to your health care provider about getting a sleep evaluation if you snore a lot or have excessive sleepiness.  Take medicines as directed by your health care provider.  For some people, aspirin or blood thinners (anticoagulants) are helpful in reducing the risk of forming abnormal blood clots that can lead to stroke. If you have the irregular heart rhythm of atrial fibrillation, you should be on a blood thinner unless there is a good reason you cannot take them.  Understand all your medicine instructions.  Make sure that other other conditions (such as anemia or atherosclerosis) are addressed. SEEK IMMEDIATE MEDICAL CARE IF:   You have sudden weakness or numbness of the face, arm, or leg, especially on one side of the body.  Your face or eyelid droops to one side.  You have sudden confusion.  You have trouble speaking (aphasia) or understanding.  You have sudden trouble seeing in one or both eyes.  You have sudden trouble walking.  You have dizziness.  You have a loss of balance or coordination.  You have a sudden, severe headache with no known cause.  You have new chest pain or an irregular heartbeat. Any of these symptoms may represent a serious problem that is an  emergency. Do not wait to see if the symptoms will go away. Get medical help at once. Call your local emergency services  (911 in [place] Do not drive yourself to the hospital. Document Released: 03/20/2004 Document Revised: 12/01/2012 Document Reviewed: 08/13/2012 [facility]  Patient Information 2014 [place], [place].

## 2013-05-12 NOTE — Progress Notes (Signed)
[facility] [address] [place]. [place] [address] [phone]       OFFICE FOLLOW-UP NOTE  Mr. Gerre Pebbles Date of Birth:  Apr 28, 1960 Medical Record Number:  [id number]   HPI: 61 year african male seen for first office f/u visit after [facility] admission on 02/23/13 point is to hemorrhage. He was found at work by coworkers to be down and left-sided weakness. EMS brought him to the hospital and CT scan of the head showed 4.9 x 3.2 cm right temporoparietal parenchymal hemorrhage with local mass effect and midline shift of 4 mm. He was admitted to the intensive care unit and blood pressure slightly controlled. He was seen on consultation by neurosurgery and conservative medical therapy was recommended. CT angiogram of the brain showed no intracranial aneurysm. MRI scan of the brain showed no underlying structural or vascular lesion. Transthoracic echo showed normal ejection fraction. Carotid Doppler showed no significant extracranial stenosis. Patient had significantly limited blood pressure 194/117 upon arrival at. He had no history of hypertension but apparently had not been taking his antihypertensives for several days. He was also found to be positive for marijuana and cocaine use. Patient showed improvement and was seen by rehabilitation and was transferred with residual left hemiparesis, dysarthria and dysphagia. He has finished inpatient rehabilitation and is currently living at home. Is able to ambulate with a cane. He still has mild left-sided weakness with diminished fine motor skills and dragging of the left leg. Can walk short distances when self and uses a cane for long distances. He states he has quit cocaine and is trying to quit smoking as well. States his blood pressure is well controlled and it is 130/89 in office today at. 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  ROS:   14 system review of  systems is positive for chills, fatigue, nose, and drooling convex 1 and malnutrition, speech difficulty and memory loss., Snoring, sleep talking and no other systems negative.  PMH:  Past Medical History  Diagnosis Date  . Hypertension   . Stroke     Social History:  History   Social History  . Marital Status: Single    Spouse Name: N/A    Number of Children: 0  . Years of Education: 12th   Occupational History  . N/A    Social History Main Topics  . Smoking status: Current Every Day Smoker -- 1.00 packs/day  . Smokeless tobacco: Not on file  . Alcohol Use: No     Comment: Drinks beer every day  . Drug Use: Yes    Special: Marijuana  . Sexual Activity: Not on file   Other Topics Concern  . Not on file   Social History Narrative   Patient lives at home with his girl friend..     Medications:   Current Outpatient Prescriptions on File Prior to Visit  Medication Sig Dispense Refill  . acetaminophen (TYLENOL) 500 MG tablet Take 1 tablet (500 mg total) by mouth every 6 (six) hours as needed.  30 tablet  0  . ALPRAZolam (XANAX) 0.25 MG tablet Take 1 tablet (0.25 mg total) by mouth 3 (three) times daily as needed for anxiety.  90 tablet  0  . amLODipine (NORVASC) 10 MG tablet Take 1 tablet (10 mg total) by mouth daily.  30 tablet  1  . gabapentin (NEURONTIN) 100 MG capsule Take 1 capsule (100 mg  total) by mouth 3 (three) times daily.  90 capsule  1  . levETIRAcetam (KEPPRA) 500 MG tablet Take 1 tablet (500 mg total) by mouth 2 (two) times daily.  60 tablet  1  . mirtazapine (REMERON) 15 MG tablet Take 1 tablet (15 mg total) by mouth at bedtime.  30 tablet  1  . Multiple Vitamin (MULTIVITAMIN WITH MINERALS) TABS tablet Take 1 tablet by mouth daily.      . propranolol (INDERAL) 20 MG tablet Take 1 tablet (20 mg total) by mouth 3 (three) times daily.  90 tablet  1  . QUEtiapine (SEROQUEL) 25 MG tablet Take 1 tablet (25 mg total) by mouth at bedtime.  30 tablet  1  . traMADol  (ULTRAM) 50 MG tablet Take 1 tablet (50 mg total) by mouth every 6 (six) hours as needed for moderate pain or severe pain.  60 tablet  0   No current facility-administered medications on file prior to visit.    Allergies:  No Known Allergies  Physical Exam General: well developed, well nourished [place] male, seated, in no evident distress Head: head normocephalic and atraumatic. Orohparynx benign Neck: supple with no carotid or supraclavicular bruits Cardiovascular: regular rate and rhythm, no murmurs Musculoskeletal: no deformity Skin:  no rash/petichiae Vascular:  Normal pulses all extremities Filed Vitals:   05/12/13 1552  BP: 132/89  Pulse: 71   Neurologic Exam Mental Status: Awake and fully alert. Oriented to place and time. Recent and remote memory intact. Attention span, concentration and fund of knowledge appropriate. Mood and affect appropriate.  Cranial Nerves: Fundoscopic exam reveals sharp disc margins. Pupils equal, briskly reactive to light. Extraocular movements full without nystagmus. Visual fields show partial left hemianopsia l to confrontation. Hearing intact. Facial sensation intact. Face, tongue, palate moves normally and symmetrically.  Motor: Mild left hemiparesis 4/5 with increased tone. Weakness of left grip and left ankle dorsiflexors. Increased tone in the left leg. Sensory.: intact to touch and pinprick and vibratory sensation.  Coordination: Rapid alternating movements normal on right and impaired slightly on left   Gait and Station: Arises from chair without difficulty. Stance is normal. Uses cane to walk. .Gait demonstrates mild circumduction with dragging of the left leg and left foot drop  Reflexes: 1+ and asymmetric brisker on left. Toes do wngoing.   NIHSS  4 Modified Rankin  2   ASSESSMENT: [age] year African American male who with right temporoparietal intracerebral hemorrhage  In January 2015 secondary to malignant hypertension and  cocaine and marijuana and alcohol abuse.    PLAN: I had a long discussion the patient and wife Re: is brain hemorrhage and his residual left hemiparesis and need to be compliant with antihypertensives and answered questions. Strict control of hypertension with blood pressure goal below 130/90. I complimented him on quitting cocaine and advised him to quit smoking completely as well. Continue outpatient physical and occupational therapy. Return for followup in 3 months with Heide GuileLynn Lam,, nurse practitioner call earlier if necessary.     Note: This document was prepared with digital dictation and possible smart phrase technology. Any transcriptional errors that result from this process are unintentional

## 2013-05-30 ENCOUNTER — Encounter: Payer: Self-pay | Admitting: Physical Medicine & Rehabilitation

## 2013-05-30 ENCOUNTER — Ambulatory Visit (HOSPITAL_BASED_OUTPATIENT_CLINIC_OR_DEPARTMENT_OTHER): Payer: Medicaid Other | Admitting: Physical Medicine & Rehabilitation

## 2013-05-30 ENCOUNTER — Encounter: Payer: Medicaid Other | Attending: Physical Medicine & Rehabilitation

## 2013-05-30 VITALS — BP 134/95 | HR 76 | Resp 14 | Ht 72.0 in | Wt 151.0 lb

## 2013-05-30 DIAGNOSIS — G4700 Insomnia, unspecified: Secondary | ICD-10-CM | POA: Insufficient documentation

## 2013-05-30 DIAGNOSIS — F3289 Other specified depressive episodes: Secondary | ICD-10-CM | POA: Insufficient documentation

## 2013-05-30 DIAGNOSIS — IMO0002 Reserved for concepts with insufficient information to code with codable children: Secondary | ICD-10-CM

## 2013-05-30 DIAGNOSIS — I6350 Cerebral infarction due to unspecified occlusion or stenosis of unspecified cerebral artery: Secondary | ICD-10-CM | POA: Insufficient documentation

## 2013-05-30 DIAGNOSIS — G8110 Spastic hemiplegia affecting unspecified side: Secondary | ICD-10-CM

## 2013-05-30 DIAGNOSIS — I619 Nontraumatic intracerebral hemorrhage, unspecified: Secondary | ICD-10-CM | POA: Insufficient documentation

## 2013-05-30 DIAGNOSIS — F329 Major depressive disorder, single episode, unspecified: Secondary | ICD-10-CM | POA: Insufficient documentation

## 2013-05-30 DIAGNOSIS — I10 Essential (primary) hypertension: Secondary | ICD-10-CM | POA: Insufficient documentation

## 2013-05-30 DIAGNOSIS — F17200 Nicotine dependence, unspecified, uncomplicated: Secondary | ICD-10-CM | POA: Insufficient documentation

## 2013-05-30 MED ORDER — GABAPENTIN 100 MG PO CAPS: 100.0000 mg | ORAL_CAPSULE | Freq: Three times a day (TID) | ORAL | Status: AC

## 2013-05-30 MED ORDER — AMLODIPINE BESYLATE 10 MG PO TABS: 10.0000 mg | ORAL_TABLET | Freq: Every day | ORAL | Status: AC

## 2013-05-30 MED ORDER — LEVETIRACETAM 500 MG PO TABS: 500.0000 mg | ORAL_TABLET | Freq: Two times a day (BID) | ORAL | Status: AC

## 2013-05-30 MED ORDER — QUETIAPINE FUMARATE 25 MG PO TABS: 25.0000 mg | ORAL_TABLET | Freq: Every day | ORAL | Status: AC

## 2013-05-30 MED ORDER — MIRTAZAPINE 15 MG PO TABS: 15.0000 mg | ORAL_TABLET | Freq: Every day | ORAL | Status: AC

## 2013-05-30 MED ORDER — PROPRANOLOL HCL 20 MG PO TABS: 20.0000 mg | ORAL_TABLET | Freq: Three times a day (TID) | ORAL | Status: AC

## 2013-05-30 NOTE — Patient Instructions (Addendum)
Follow up with the community clinic [facility] on April 18, 2013 This is for your medical doctor   [facility]

## 2013-05-30 NOTE — Progress Notes (Signed)
Subjective:    Patient ID: Eric May, male    DOB: 07-Feb-1961, 53 y.o.   MRN: [id number] This is a 53 year old right-handed male  with history of hypertension, tobacco and alcohol abuse, admitted on  February 23, 2013, when he developed left-sided numbness and weakness  while at work. Patient reports falling at that time due to weakness on  the left. Blood pressure 194/117. CT of the head showed acute  parenchymal hemorrhage over the right temporal parietal region measuring  4.9 x 3.2 cm with local mass effect and midline shift of 4 mm.  Echocardiogram with ejection fraction of 65%. No valvular  abnormalities. Carotid Dopplers with no ICA stenosis. MRI of the  brain, February 24, 2013 showed intraparenchymal hematoma with surrounding  vasogenic edema.  HPI CIR admission DATE OF ADMISSION: 03/02/2013  DATE OF DISCHARGE: 04/14/2013  Since discharge has received home health therapy up until 2 weeks ago. Patient still feels weak on his left side He is now ambulating with a walker but is able to dress himself. he notes some swelling in the left arm and left leg and occasional pain at night. He was treated for neurogenic pain in the hospital little using gabapentin with improvement. He has followed up with neurology and has another followup appointment in about 2 months In addition patient has followed up with community health but did not schedule since February and is starting to run low on medications. Pain Inventory Average Pain 3 Pain Right Now 3 My pain is intermittent, sharp and burning  In the last 24 hours, has pain interfered with the following? General activity 10 Relation with others 10 Enjoyment of life 10 What TIME of day is your pain at its worst? morning Sleep (in general) Fair  Pain is worse with: walking and some activites Pain improves with: rest Relief from Meds: no pain meds  Mobility walk with assistance use a walker Do you have any goals in this area?   yes  Function not employed: date last employed na I need assistance with the following:  household duties and shopping  Neuro/Psych weakness numbness tingling spasms  Prior Studies Any changes since last visit?  no  Physicians involved in your care Any changes since last visit?  no   Family History  Problem Relation Age of Onset  . Diabetes Sister    History   Social History  . Marital Status: Single    Spouse Name: N/A    Number of Children: 0  . Years of Education: 12th   Occupational History  . N/A    Social History Main Topics  . Smoking status: Current Every Day Smoker -- 1.00 packs/day  . Smokeless tobacco: None  . Alcohol Use: No     Comment: Drinks beer every day  . Drug Use: Yes    Special: Marijuana  . Sexual Activity: None   Other Topics Concern  . None   Social History Narrative   Patient lives at home with his girl friend..    History reviewed. No pertinent past surgical history. Past Medical History  Diagnosis Date  . Hypertension   . Stroke    BP 134/95  Pulse 76  Resp 14  Ht 6' (1.829 m)  Wt 151 lb (68.493 kg)  BMI 20.47 kg/m2  SpO2 99%  Opioid Risk Score:   Fall Risk Score: Moderate Fall Risk (6-13 points) (pt educated and given brochure on fall risk)    Review of Systems  Musculoskeletal: Positive for  gait problem.  Neurological: Positive for weakness and numbness.       Tingling, spasms  All other systems reviewed and are negative.       Objective:   Physical Exam Motor strength is 5/5 in the right deltoid, bicep, tricep, grip, hip flexor, knee extensors, ankle dorsiflexor plantar flexor 4 minus in the left deltoid, bicep, tricep, grip, hip flexor, knee extensors, ankle dorsiflexor and plantar flexor Sensation reduced to light touch on the left side Fine motor is reduced with finger to thumb opposition on left side Speech without evidence of dysarthria or aphasia Visual fields are intact the confrontational testing  there is no evidence of left neglect on double simultaneous stimulation Ambulation with a rolling walker no evidence of toe drag or knee instability       Assessment & Plan:  1.  Right temporal ICH with Left HP, Left hemisensory deficits from hypertensive bleed. BPs in a good range on current meds, will refill with instuctions to follow up with [facility] Referral to OP PT,OT,SLP at [facility] 2.  ? Post CVA seizure, f/u Neuro, cont Keppra, will refill 3.  Post CVA depression cont Remeron 4.  Insomnia , low dose seroquel and remeron  RTC PMR PRN

## 2013-06-07 ENCOUNTER — Telehealth: Payer: Self-pay

## 2013-06-07 NOTE — Telephone Encounter (Signed)
Patient called upset asking why we had not sent the OP referral to [facility]. Contacted the patient to inform him that we had sent to referral to [facility]. Patient then said he contact them and they said that they can't start therapy until the patient has insurance unless he is self-pay. Informed patient that we had did our part and now have to wait on his medicaid to be approved before he can start therapy. Patient was really upset and said he didn't understand. I offered to explain to his mother and he said she wouldn't understand either.

## 2013-08-22 ENCOUNTER — Ambulatory Visit: Payer: Self-pay | Admitting: Nurse Practitioner

## 2014-04-13 IMAGING — CR DG ABD PORTABLE 1V
1 series · 1 of 1 positions shown · non-contrast
Comparison: None.

CLINICAL DATA: Constipation with nausea and vomiting for 2 days

EXAM:
PORTABLE ABDOMEN - 1 VIEW

[AP]
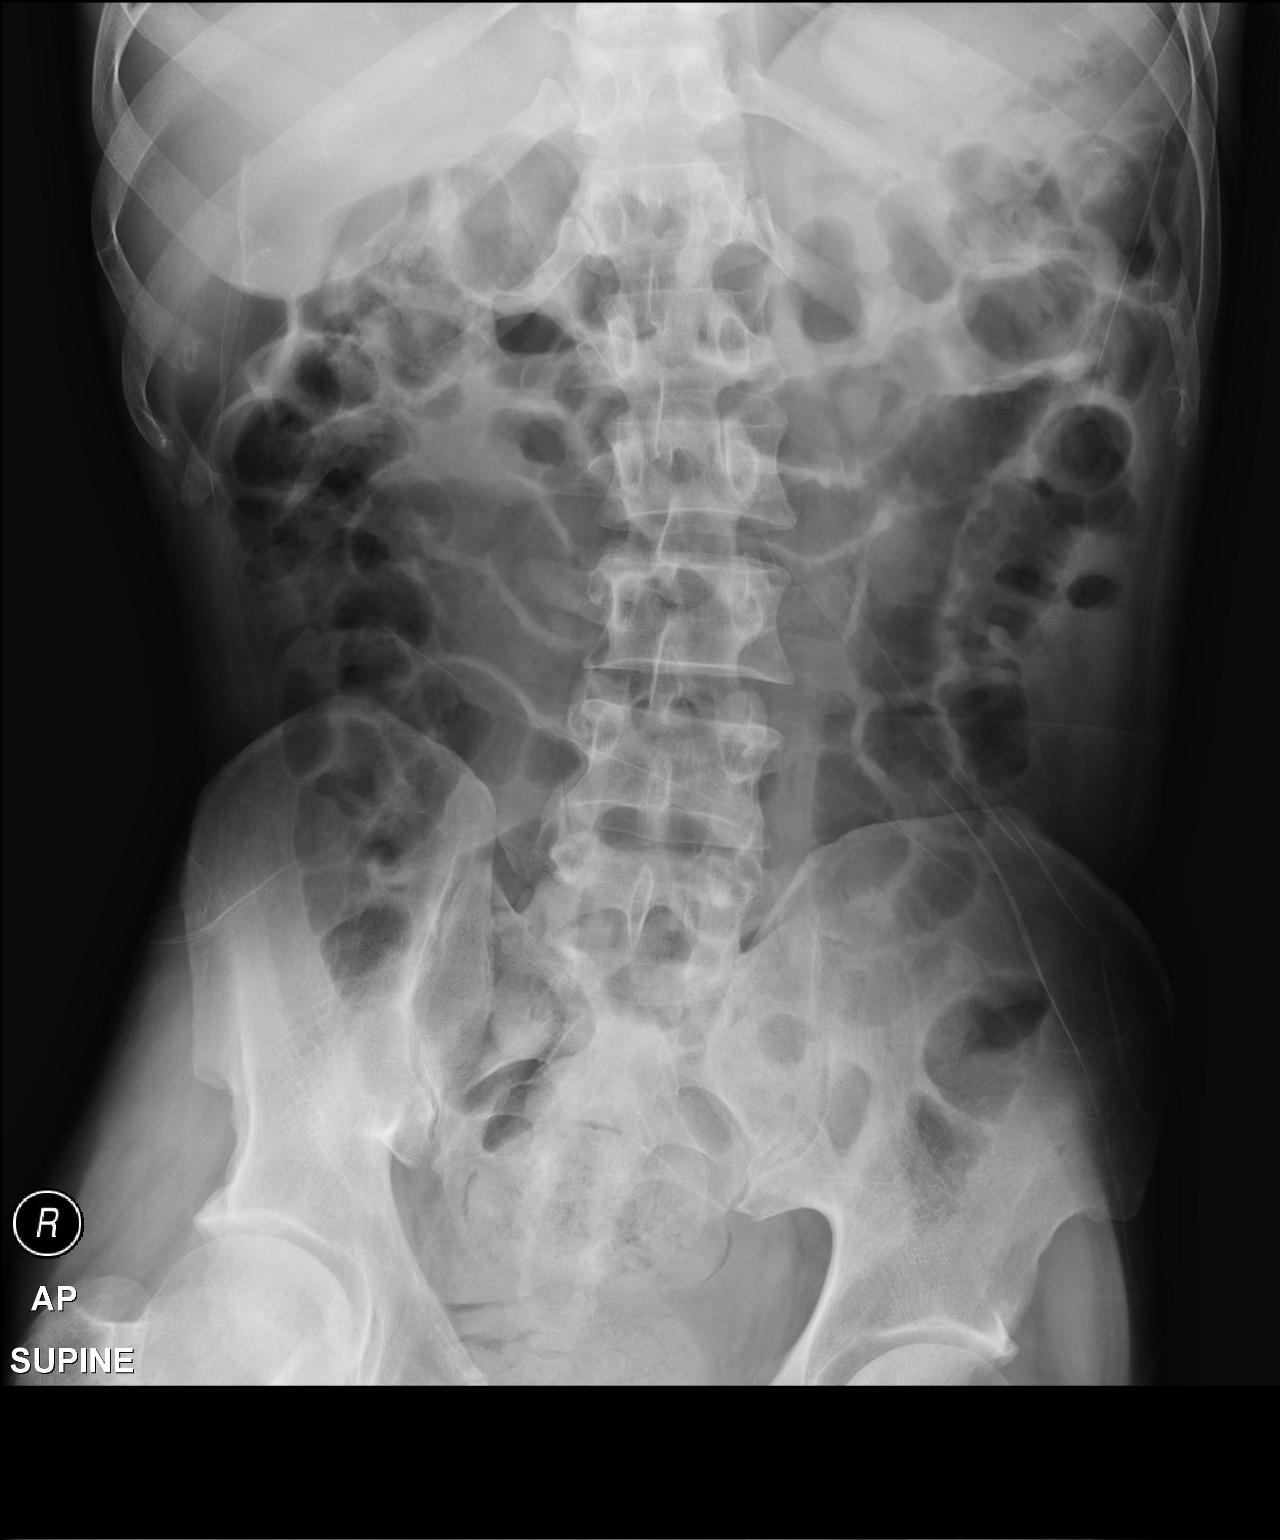

[1 of 1 positions shown; findings below may reference images not displayed]

FINDINGS: There is a moderate amount of gas within loops of small bowel as
well as throughout much of the colon. No massive distention is seen.
There is a large amount of stool in the rectum in the appearance of
this suggests a fecal impaction. No abnormal soft tissue
calcifications are demonstrated. Gentle curvature of the lumbar
spine with convexity towards the left may be positional.
IMPRESSION: The bowel gas pattern suggesta fecal impaction with proximal
distention of small and large bowel loops.

## 2016-03-06 ENCOUNTER — Encounter (HOSPITAL_COMMUNITY): Payer: Self-pay | Admitting: Emergency Medicine

## 2016-03-06 DIAGNOSIS — I10 Essential (primary) hypertension: Secondary | ICD-10-CM | POA: Diagnosis not present

## 2016-03-06 DIAGNOSIS — Z79899 Other long term (current) drug therapy: Secondary | ICD-10-CM | POA: Diagnosis not present

## 2016-03-06 DIAGNOSIS — M79602 Pain in left arm: Secondary | ICD-10-CM | POA: Diagnosis present

## 2016-03-06 DIAGNOSIS — Z8673 Personal history of transient ischemic attack (TIA), and cerebral infarction without residual deficits: Secondary | ICD-10-CM | POA: Diagnosis not present

## 2016-03-06 DIAGNOSIS — F17200 Nicotine dependence, unspecified, uncomplicated: Secondary | ICD-10-CM | POA: Insufficient documentation

## 2016-03-06 DIAGNOSIS — G8929 Other chronic pain: Secondary | ICD-10-CM | POA: Diagnosis not present

## 2016-03-06 DIAGNOSIS — M79605 Pain in left leg: Secondary | ICD-10-CM | POA: Insufficient documentation

## 2016-03-06 NOTE — ED Triage Notes (Signed)
Pt presents to ED for assessment of left-sided pain "from my head to my toes".  Pt denies any SOB, weakness, fever/chills, URI symptoms, abdominal pain, n/v/d.  Pt sts he had a stroke and he has had "nothing but pain" ever since.  Pt sts the pain is all down his left side.  Pt has residual weakness on left side from previous stroke.  Pt denies pain in joints specifically, denies injury, cannot give me an exact date that pain started or how.

## 2016-03-07 ENCOUNTER — Emergency Department (HOSPITAL_COMMUNITY)
Admission: EM | Admit: 2016-03-07 | Discharge: 2016-03-07 | Disposition: A | Discharge status: Home / Self Care | Payer: Medicaid Other | Attending: Emergency Medicine | Admitting: Emergency Medicine

## 2016-03-07 DIAGNOSIS — G8929 Other chronic pain: Secondary | ICD-10-CM

## 2016-03-07 MED ORDER — KETOROLAC TROMETHAMINE 30 MG/ML IJ SOLN
30.0000 mg | Freq: Once | INTRAMUSCULAR | Status: AC
  Administered 2016-03-07: 30 mg via INTRAVENOUS
  Filled 2016-03-07: qty 1

## 2016-03-07 MED ORDER — OXYCODONE HCL 5 MG PO TABS: 5.0000 mg | ORAL_TABLET | Freq: Two times a day (BID) | ORAL | 0 refills | Status: AC | PRN

## 2016-03-07 MED ORDER — HYDROMORPHONE HCL 2 MG/ML IJ SOLN
1.0000 mg | Freq: Once | INTRAMUSCULAR | Status: AC
  Administered 2016-03-07: 1 mg via INTRAVENOUS
  Filled 2016-03-07: qty 1

## 2016-03-07 NOTE — ED Notes (Signed)
Attempted IV x2. 

## 2016-03-07 NOTE — ED Provider Notes (Signed)
[facility]-EMERGENCY DEPT Provider Note   CSN: [id number] Arrival date & time: 03/06/16  4098  By signing my name below, I, Freida Busman and Elder Negus , attest that this documentation has been prepared under the direction and in the presence of Tomasita Crumble, MD . Electronically Signed: Freida Busman, Scribe. 03/07/2016. 12:46 AM.  History   Chief Complaint Chief Complaint  Patient presents with  . Headache  . Arm Pain  . Leg Pain    HPI Eric May is a 56 y.o. male with history of CVA 3 years ago with residual L sided weakness presents to the ED with "pain down my entire L side". The patient states that for the last 3 years he has experienced constant pain in his L leg as a result of his stroke. However in the last 3 weeks the patient states that he is now experiencing similar pain along his "entire L side" including the L arm and L leg. He denies any changes in sensation or strength. His pain is unchanged in character otherwise. He denies any recent injuries. He is followed by primary care where he receives Ibuprofen and Gabapentin, which provides no relief and he feels that he is not receiving the necessary imaging recently for his pain and presents to this facility for workup. He has no other complaints than the pain complaints as noted.  The history is provided by the patient. No language interpreter was used.    Past Medical History:  Diagnosis Date  . Hypertension   . Stroke [facility])     Patient Active Problem List   Diagnosis Date Noted  . 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 Spastic hemiplegia affecting nondominant side (HCC) 05/30/2013  . Essential hypertension, malignant 05/12/2013  . Cytotoxic brain edema (HCC) 05/12/2013  . ICH (intracerebral hemorrhage) (HCC) 03/02/2013  . Convulsions/seizures (HCC) 02/24/2013  . Intracerebral hemorrhage (HCC) 02/23/2013    History reviewed. No pertinent surgical history.     Home Medications    Prior to Admission  medications   Medication Sig Start Date End Date Taking? Authorizing Provider  acetaminophen (TYLENOL) 500 MG tablet Take 1 tablet (500 mg total) by mouth every 6 (six) hours as needed. 02/23/13   Margarita Grizzle, MD  ALPRAZolam Prudy Feeler) 0.25 MG tablet Take 1 tablet (0.25 mg total) by mouth 3 (three) times daily as needed for anxiety. 04/14/13   Mcarthur Rossetti Angiulli, PA-C  amLODipine (NORVASC) 10 MG tablet Take 1 tablet (10 mg total) by mouth daily. 05/30/13   Erick Colace, MD  gabapentin (NEURONTIN) 100 MG capsule Take 1 capsule (100 mg total) by mouth 3 (three) times daily. 05/30/13   Erick Colace, MD  levETIRAcetam (KEPPRA) 500 MG tablet Take 1 tablet (500 mg total) by mouth 2 (two) times daily. 05/30/13   Erick Colace, MD  mirtazapine (REMERON) 15 MG tablet Take 1 tablet (15 mg total) by mouth at bedtime. 05/30/13   Erick Colace, MD  Multiple Vitamin (MULTIVITAMIN WITH MINERALS) TABS tablet Take 1 tablet by mouth daily. 04/14/13   Mcarthur Rossetti Angiulli, PA-C  propranolol (INDERAL) 20 MG tablet Take 1 tablet (20 mg total) by mouth 3 (three) times daily. 05/30/13   Erick Colace, MD  QUEtiapine (SEROQUEL) 25 MG tablet Take 1 tablet (25 mg total) by mouth at bedtime. 05/30/13   Erick Colace, MD  traMADol (ULTRAM) 50 MG tablet Take 1 tablet (50 mg total) by mouth every 6 (six) hours as needed for moderate pain  or severe pain. 04/14/13   Mcarthur Rossettianiel J Angiulli, PA-C    Family History Family History  Problem Relation Age of Onset  . Diabetes Sister     Social History Social History  Substance Use Topics  . Smoking status: Current Every Day Smoker    Packs/day: 1.00  . Smokeless tobacco: Never Used  . Alcohol use No     Comment: Drinks beer every day     Allergies   Patient has no known allergies.   Review of Systems Review of Systems 10 systems reviewed and all are negative for acute change except as noted in the HPI.    Physical Exam Updated Vital Signs BP 147/96    Pulse 60   Temp 98.6 F (37 C)   Resp 18   Ht 5\' 11"  (1.803 m)   Wt 140 lb (63.5 kg)   SpO2 98%   BMI 19.53 kg/m   Physical Exam  Constitutional: He is oriented to person, place, and time. Vital signs are normal. He appears well-developed and well-nourished.  Non-toxic appearance. He does not appear ill. No distress.  HENT:  Head: Normocephalic and atraumatic.  Nose: Nose normal.  Mouth/Throat: Oropharynx is clear and moist. No oropharyngeal exudate.  Eyes: Conjunctivae and EOM are normal. Pupils are equal, round, and reactive to light. No scleral icterus.  Neck: Normal range of motion. Neck supple. No tracheal deviation, no edema, no erythema and normal range of motion present. No thyroid mass and no thyromegaly present.  Cardiovascular: Normal rate, regular rhythm, S1 normal, S2 normal, normal heart sounds, intact distal pulses and normal pulses.  Exam reveals no gallop and no friction rub.   No murmur heard. Pulmonary/Chest: Effort normal and breath sounds normal. No respiratory distress. He has no wheezes. He has no rhonchi. He has no rales.  Abdominal: Soft. Normal appearance and bowel sounds are normal. He exhibits no distension, no ascites and no mass. There is no hepatosplenomegaly. There is no tenderness. There is no rebound, no guarding and no CVA tenderness.  Musculoskeletal: Normal range of motion. He exhibits no edema or tenderness.  Lymphadenopathy:    He has no cervical adenopathy.  Neurological: He is alert and oriented to person, place, and time. He has normal strength. No cranial nerve deficit.  3/5 strength in the Left upper and Left lower extremities. 5/5 strength in the Right upper and Right lower extremities. Normal cerebellar testing.   Skin: Skin is warm, dry and intact. No petechiae and no rash noted. He is not diaphoretic. No erythema. No pallor.  Nursing note and vitals reviewed.    ED Treatments / Results  DIAGNOSTIC STUDIES:  Oxygen Saturation is 98% on  RA, normal by my interpretation.    COORDINATION OF CARE:  12:47 AM Discussed treatment plan with pt at bedside and pt agreed to plan.   Labs (all labs ordered are listed, but only abnormal results are displayed) Labs Reviewed - No data to display  EKG  EKG Interpretation None       Radiology No results found.  Procedures Procedures (including critical care time)  Medications Ordered in ED Medications - No data to display   Initial Impression / Assessment and Plan / ED Course  I have reviewed the triage vital signs and the nursing notes.  Pertinent labs & imaging results that were available during my care of the patient were reviewed by me and considered in my medical decision making (see chart for details).  Clinical Course  Patient presents to the ED for chronic left side pain throughout his entire body.  This is chronic pain for 3 years. Sounds neurological in nature.  I do not think he needs any emergent testing.  He was given toradol and dilaudid to help his pain.     2:17 AM Pt reassessed. Pain improved after dilaudid.    Will DC with oxycodone to take as needed.  Advised on PCP fu and pain clinic management.  He and family demonstrate good understanding.  He appears well and inNAD.  VS remain within his normal limits and he is safe for DC.  Final Clinical Impressions(s) / ED Diagnoses   Final diagnoses:  None    New Prescriptions New Prescriptions   No medications on file     I personally performed the services described in this documentation, which was scribed in my presence. The recorded information has been reviewed and is accurate.      Tomasita Crumble, MD 03/07/16 [phone]
